# Patient Record
Sex: Female | Born: 1955 | Race: White | Hispanic: No | Marital: Married | Smoking: Never smoker
Health system: Southern US, Community
[De-identification: ages and names within clinical notes are randomized; demographics above are authoritative.]

## PROBLEM LIST (undated history)

## (undated) DIAGNOSIS — IMO0001 Reserved for inherently not codable concepts without codable children: Secondary | ICD-10-CM

## (undated) DIAGNOSIS — I10 Essential (primary) hypertension: Secondary | ICD-10-CM

## (undated) DIAGNOSIS — I471 Supraventricular tachycardia, unspecified: Secondary | ICD-10-CM

## (undated) DIAGNOSIS — F329 Major depressive disorder, single episode, unspecified: Secondary | ICD-10-CM

## (undated) DIAGNOSIS — K219 Gastro-esophageal reflux disease without esophagitis: Secondary | ICD-10-CM

## (undated) DIAGNOSIS — N281 Cyst of kidney, acquired: Secondary | ICD-10-CM

## (undated) DIAGNOSIS — F419 Anxiety disorder, unspecified: Secondary | ICD-10-CM

## (undated) DIAGNOSIS — R030 Elevated blood-pressure reading, without diagnosis of hypertension: Secondary | ICD-10-CM

## (undated) DIAGNOSIS — F32A Depression, unspecified: Secondary | ICD-10-CM

## (undated) DIAGNOSIS — N200 Calculus of kidney: Secondary | ICD-10-CM

## (undated) HISTORY — DX: Supraventricular tachycardia: I47.1

## (undated) HISTORY — DX: Major depressive disorder, single episode, unspecified: F32.9

## (undated) HISTORY — DX: Essential (primary) hypertension: I10

## (undated) HISTORY — DX: Cyst of kidney, acquired: N28.1

## (undated) HISTORY — DX: Reserved for inherently not codable concepts without codable children: IMO0001

## (undated) HISTORY — DX: Depression, unspecified: F32.A

## (undated) HISTORY — DX: Gastro-esophageal reflux disease without esophagitis: K21.9

## (undated) HISTORY — DX: Calculus of kidney: N20.0

## (undated) HISTORY — PX: OTHER SURGICAL HISTORY: SHX169

## (undated) HISTORY — DX: Anxiety disorder, unspecified: F41.9

## (undated) HISTORY — DX: Supraventricular tachycardia, unspecified: I47.10

## (undated) HISTORY — DX: Elevated blood-pressure reading, without diagnosis of hypertension: R03.0

---

## 2001-02-21 HISTORY — PX: TUBAL LIGATION: SHX77

## 2005-03-17 HISTORY — PX: COLONOSCOPY: SHX174

## 2005-05-13 ENCOUNTER — Ambulatory Visit: Payer: Self-pay | Admitting: General Surgery

## 2005-05-23 ENCOUNTER — Ambulatory Visit: Payer: Self-pay | Admitting: General Surgery

## 2005-06-19 ENCOUNTER — Ambulatory Visit: Payer: Self-pay | Admitting: General Surgery

## 2005-07-15 HISTORY — PX: CHOLECYSTECTOMY: SHX55

## 2005-07-30 ENCOUNTER — Ambulatory Visit: Payer: Self-pay | Admitting: General Surgery

## 2006-05-04 ENCOUNTER — Ambulatory Visit: Payer: Self-pay | Admitting: Specialist

## 2006-12-01 ENCOUNTER — Ambulatory Visit: Payer: Self-pay | Admitting: Specialist

## 2007-12-01 ENCOUNTER — Ambulatory Visit: Payer: Self-pay | Admitting: Specialist

## 2008-03-14 LAB — CBC AND DIFFERENTIAL: Hemoglobin: 12.8 g/dL (ref 12.0–16.0)

## 2008-03-14 LAB — LIPID PANEL
Cholesterol: 209 mg/dL — AB (ref 0–200)
Triglycerides: 130 mg/dL (ref 40–160)

## 2008-03-14 LAB — HEPATIC FUNCTION PANEL
ALT: 16 U/L (ref 7–35)
Alkaline Phosphatase: 109 U/L (ref 25–125)
Bilirubin, Total: 0.5 mg/dL

## 2008-03-14 LAB — BASIC METABOLIC PANEL
Creatinine: 1 mg/dL (ref 0.5–1.1)
Potassium: 4 mmol/L (ref 3.4–5.3)
Sodium: 147 mmol/L (ref 137–147)

## 2009-12-24 ENCOUNTER — Ambulatory Visit: Payer: Self-pay | Admitting: Specialist

## 2010-02-07 ENCOUNTER — Emergency Department: Payer: Self-pay | Admitting: Emergency Medicine

## 2010-02-13 ENCOUNTER — Ambulatory Visit: Payer: Self-pay | Admitting: Specialist

## 2010-02-15 LAB — PATHOLOGY REPORT

## 2010-03-20 ENCOUNTER — Encounter: Payer: Self-pay | Admitting: Specialist

## 2010-10-04 LAB — BASIC METABOLIC PANEL
Creatinine: 0.8 mg/dL (ref 0.5–1.1)
Glucose: 112 mg/dL
Potassium: 4.7 mmol/L (ref 3.4–5.3)
Sodium: 139 mmol/L (ref 137–147)

## 2010-10-04 LAB — HEPATIC FUNCTION PANEL: AST: 13 U/L (ref 13–35)

## 2010-10-04 LAB — LIPID PANEL
Cholesterol: 187 mg/dL (ref 0–200)
HDL: 55 mg/dL (ref 35–70)

## 2010-10-04 LAB — VITAMIN D 25 HYDROXY (VIT D DEFICIENCY, FRACTURES): Vit D, 25-Hydroxy: 25.2

## 2010-10-04 LAB — HEMOGLOBIN A1C: Hgb A1c MFr Bld: 6.1 % — AB (ref 4.0–6.0)

## 2010-10-04 LAB — TSH: TSH: 1.42 u[IU]/mL (ref 0.41–5.90)

## 2010-11-16 ENCOUNTER — Encounter: Payer: Self-pay | Admitting: Specialist

## 2011-04-14 ENCOUNTER — Emergency Department: Payer: Self-pay | Admitting: Emergency Medicine

## 2011-04-14 LAB — URINALYSIS, COMPLETE
Bilirubin,UR: NEGATIVE
Blood: NEGATIVE
Glucose,UR: NEGATIVE mg/dL (ref 0–75)
Leukocyte Esterase: NEGATIVE
Nitrite: NEGATIVE
Ph: 6 (ref 4.5–8.0)
Protein: NEGATIVE
Squamous Epithelial: 1
WBC UR: 1 /HPF (ref 0–5)

## 2011-04-21 ENCOUNTER — Encounter: Payer: Self-pay | Admitting: Internal Medicine

## 2011-04-21 ENCOUNTER — Ambulatory Visit (INDEPENDENT_AMBULATORY_CARE_PROVIDER_SITE_OTHER): Payer: PRIVATE HEALTH INSURANCE | Admitting: Internal Medicine

## 2011-04-21 DIAGNOSIS — E669 Obesity, unspecified: Secondary | ICD-10-CM | POA: Insufficient documentation

## 2011-04-21 DIAGNOSIS — Z0000 Encounter for general adult medical examination without abnormal findings: Secondary | ICD-10-CM

## 2011-04-21 DIAGNOSIS — F39 Unspecified mood [affective] disorder: Secondary | ICD-10-CM | POA: Insufficient documentation

## 2011-04-21 DIAGNOSIS — Z1239 Encounter for other screening for malignant neoplasm of breast: Secondary | ICD-10-CM

## 2011-04-21 DIAGNOSIS — F3289 Other specified depressive episodes: Secondary | ICD-10-CM

## 2011-04-21 DIAGNOSIS — G5710 Meralgia paresthetica, unspecified lower limb: Secondary | ICD-10-CM

## 2011-04-21 DIAGNOSIS — F329 Major depressive disorder, single episode, unspecified: Secondary | ICD-10-CM

## 2011-04-21 DIAGNOSIS — E119 Type 2 diabetes mellitus without complications: Secondary | ICD-10-CM | POA: Insufficient documentation

## 2011-04-21 DIAGNOSIS — E1169 Type 2 diabetes mellitus with other specified complication: Secondary | ICD-10-CM | POA: Insufficient documentation

## 2011-04-21 DIAGNOSIS — F32A Depression, unspecified: Secondary | ICD-10-CM

## 2011-04-21 LAB — LIPID PANEL
Cholesterol: 187 mg/dL (ref 0–200)
LDL Cholesterol: 108 mg/dL — ABNORMAL HIGH (ref 0–99)
Total CHOL/HDL Ratio: 4
Triglycerides: 168 mg/dL — ABNORMAL HIGH (ref 0.0–149.0)

## 2011-04-21 LAB — CBC WITH DIFFERENTIAL/PLATELET
Eosinophils Relative: 2.4 % (ref 0.0–5.0)
HCT: 38.8 % (ref 36.0–46.0)
Hemoglobin: 13.1 g/dL (ref 12.0–15.0)
Lymphs Abs: 2.1 10*3/uL (ref 0.7–4.0)
Monocytes Relative: 7.6 % (ref 3.0–12.0)
Neutro Abs: 4.3 10*3/uL (ref 1.4–7.7)
WBC: 7.1 10*3/uL (ref 4.5–10.5)

## 2011-04-21 LAB — COMPREHENSIVE METABOLIC PANEL
Albumin: 4 g/dL (ref 3.5–5.2)
Alkaline Phosphatase: 87 U/L (ref 39–117)
BUN: 13 mg/dL (ref 6–23)
CO2: 27 mEq/L (ref 19–32)
Calcium: 8.9 mg/dL (ref 8.4–10.5)
Chloride: 104 mEq/L (ref 96–112)
Glucose, Bld: 109 mg/dL — ABNORMAL HIGH (ref 70–99)
Potassium: 3.9 mEq/L (ref 3.5–5.1)
Sodium: 138 mEq/L (ref 135–145)
Total Protein: 7.5 g/dL (ref 6.0–8.3)

## 2011-04-21 LAB — TSH: TSH: 1.53 u[IU]/mL (ref 0.35–5.50)

## 2011-04-21 MED ORDER — CITALOPRAM HYDROBROMIDE 20 MG PO TABS: 20.0000 mg | ORAL_TABLET | Freq: Every day | ORAL | Status: DC

## 2011-04-21 NOTE — Progress Notes (Signed)
Subjective:    Patient ID: Shelby Estes, female    DOB: 02/20/1956, 56 y.o.   MRN: [id number]  HPI 56 year old female with history of diabetes, depression, and recent history of right femoral cutaneous nerve syndrome presents to establish care. In regards to her diabetes, she notes that she hasn't been regular the checking her blood sugar. She has not been taking her medication. She has not had her A1c checked in several months. She denies polyuria or polyphasia. She denies weight loss.  In regards to her depression, she notes some recent exacerbation with marital stressors involving her husband and stepchildren. She is tearful today when describing the situation. She reports some improvement with the use of Celexa and does not wish change in medication dose. She is not currently interested in counseling and she does not feel that her husband will participate in this.  In regards to her weight, she notes that she has recently gained almost 30 pounds over the last year. She denies any change in her intake of food. She does not follow a specific diet. She does not currently exercise.  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 She occasionally has numbness extending down her anterior right thigh to her knee. She has not had any weakness in her right leg. She was also instructed to make effort to lose weight to help control symptoms.  Outpatient Encounter Prescriptions as of 04/21/2011  Medication Sig Dispense Refill  . Cholecalciferol (VITAMIN D) 2000 UNITS CAPS Take 1 capsule by mouth daily.      . citalopram (CELEXA) 20 MG tablet Take 1 tablet (20 mg total) by mouth daily.  30 tablet  6  . gabapentin (NEURONTIN) 400 MG capsule Take 400 mg by mouth 2 (two) times daily as needed.      Marland Kitchen HYDROcodone-acetaminophen (NORCO) 5-325 MG per tablet Take 1 tablet by mouth  every 8 (eight) hours as needed.      . meloxicam (MOBIC) 15 MG tablet Take 15 mg by mouth daily.      Marland Kitchen DISCONTD: citalopram (CELEXA) 20 MG tablet Take 20 mg by mouth daily.        Review of Systems  Constitutional: Negative for fever, chills, appetite change, fatigue and unexpected weight change.  HENT: Negative for ear pain, congestion, sore throat, trouble swallowing, neck pain, voice change and sinus pressure.   Eyes: Negative for visual disturbance.  Respiratory: Negative for cough, shortness of breath, wheezing and stridor.   Cardiovascular: Negative for chest pain, palpitations and leg swelling.  Gastrointestinal: Negative for nausea, vomiting, abdominal pain, diarrhea, constipation, blood in stool, abdominal distention and anal bleeding.  Genitourinary: Negative for dysuria and flank pain.  Musculoskeletal: Positive for myalgias. Negative for arthralgias and gait problem.  Skin: Negative for color change and rash.  Neurological: Positive for numbness. Negative for dizziness and headaches.  Hematological: Negative for adenopathy. Does not bruise/bleed easily.  Psychiatric/Behavioral: Positive for dysphoric mood. Negative for suicidal ideas and sleep disturbance. The patient is not nervous/anxious.    BP 130/80  Pulse 116  Temp(Src) 98.1 F (36.7 C) (Oral)  Ht 5' 3.5" (1.613 m)  Wt 282 lb (127.914 kg)  BMI 49.17 kg/m2  SpO2 96%     Objective:   Physical Exam  Constitutional: She is oriented to person, place, and time. She appears well-developed and well-nourished. No distress.  HENT:  Head: Normocephalic and  atraumatic.  Right Ear: External ear normal.  Left Ear: External ear normal.  Nose: Nose normal.  Mouth/Throat: Oropharynx is clear and moist. No oropharyngeal exudate.  Eyes: Conjunctivae are normal. Pupils are equal, round, and reactive to light. Right eye exhibits no discharge. Left eye exhibits no discharge. No scleral icterus.  Neck: Normal range of motion. Neck  supple. No tracheal deviation present. No thyromegaly present.  Cardiovascular: Normal rate, regular rhythm, normal heart sounds and intact distal pulses.  Exam reveals no gallop and no friction rub.   No murmur heard. Pulmonary/Chest: Effort normal and breath sounds normal. No respiratory distress. She has no wheezes. She has no rales. She exhibits no tenderness.  Abdominal: Soft. Bowel sounds are normal. She exhibits no distension and no mass. There is no tenderness. There is no rebound and no guarding.  Musculoskeletal: Normal range of motion. She exhibits no edema and no tenderness.  Lymphadenopathy:    She has no cervical adenopathy.  Neurological: She is alert and oriented to person, place, and time. No cranial nerve deficit. She exhibits normal muscle tone. Coordination normal.  Skin: Skin is warm and dry. No rash noted. She is not diaphoretic. No erythema. No pallor.  Psychiatric: She has a normal mood and affect. Her behavior is normal. Judgment and thought content normal.          Assessment & Plan:

## 2011-04-21 NOTE — Assessment & Plan Note (Signed)
BMI 49. Patient is interested in losing weight. We discussed keeping a food diary and setting goal of 1500 calories per day. We also discussed using medicine to suppress appetite such as phentermine. For now, we'll hold off on medication, will check labs including thyroid function to make sure no other underlying causes for weight gain. Followup in one month.

## 2011-04-21 NOTE — Assessment & Plan Note (Signed)
>>  ASSESSMENT AND PLAN FOR ANXIETY AND DEPRESSION WRITTEN ON 04/21/2011 12:45 PM BY VANNIE, JENNIFER A, MD  Recently worsened with marital stressors. Offered support today. Patient is currently taking Celexa . She feels that her dose is adequate and does not want to change dose of this medication. We discussed the option of counseling however she does not feel that her husband would protect paganism does not feel that it would be helpful. She will followup in one month.

## 2011-04-21 NOTE — Assessment & Plan Note (Addendum)
Pt has not been checking blood sugars. Will check A1c with labs today. Followup in one month.

## 2011-04-21 NOTE — Assessment & Plan Note (Signed)
Recently worsened with marital stressors. Offered support today. Patient is currently taking Celexa. She feels that her dose is adequate and does not want to change dose of this medication. We discussed the option of counseling however she does not feel that her husband would protect paganism does not feel that it would be helpful. She will followup in one month.

## 2011-04-21 NOTE — Assessment & Plan Note (Signed)
Symptoms currently well-controlled use of Neurontin and meloxicam. We'll continue to monitor.

## 2011-04-29 ENCOUNTER — Encounter: Payer: Self-pay | Admitting: Internal Medicine

## 2011-05-01 ENCOUNTER — Telehealth: Payer: Self-pay | Admitting: *Deleted

## 2011-05-01 MED ORDER — PHENTERMINE HCL 37.5 MG PO CAPS: 37.5000 mg | ORAL_CAPSULE | ORAL | Status: DC

## 2011-05-01 NOTE — Telephone Encounter (Signed)
Called in.

## 2011-05-01 NOTE — Telephone Encounter (Signed)
Fine to call in phentermine.

## 2011-05-23 ENCOUNTER — Encounter: Payer: Self-pay | Admitting: Internal Medicine

## 2011-05-23 ENCOUNTER — Ambulatory Visit (INDEPENDENT_AMBULATORY_CARE_PROVIDER_SITE_OTHER): Payer: PRIVATE HEALTH INSURANCE | Admitting: Internal Medicine

## 2011-05-23 VITALS — BP 122/80 | HR 125 | Temp 98.1°F | Ht 63.5 in | Wt 272.0 lb

## 2011-05-23 DIAGNOSIS — E1169 Type 2 diabetes mellitus with other specified complication: Secondary | ICD-10-CM

## 2011-05-23 DIAGNOSIS — G5710 Meralgia paresthetica, unspecified lower limb: Secondary | ICD-10-CM

## 2011-05-23 DIAGNOSIS — E119 Type 2 diabetes mellitus without complications: Secondary | ICD-10-CM

## 2011-05-23 DIAGNOSIS — E66813 Obesity, class 3: Secondary | ICD-10-CM

## 2011-05-23 DIAGNOSIS — Z124 Encounter for screening for malignant neoplasm of cervix: Secondary | ICD-10-CM

## 2011-05-23 DIAGNOSIS — Z1239 Encounter for other screening for malignant neoplasm of breast: Secondary | ICD-10-CM

## 2011-05-23 DIAGNOSIS — E669 Obesity, unspecified: Secondary | ICD-10-CM

## 2011-05-23 NOTE — Assessment & Plan Note (Signed)
Pelvic exam normal today. Pap is pending.

## 2011-05-23 NOTE — Assessment & Plan Note (Signed)
BMI 47. Congratulated patient on 10 pound weight loss since her last visit. She has only started using phentermine for appetite suppression within the last week. She denies any side effects noted from this medicine. We'll continue to use. She will followup monthly well using phentermine. Encouraged her to continue to limit caloric intake to less than 1500 calories per day and to gradually increase physical activity.

## 2011-05-23 NOTE — Progress Notes (Signed)
Subjective:    Patient ID: Shelby Estes, female    DOB: 29-Jun-1955, 56 y.o.   MRN: [id number]  HPI 56 year old female with obesity, diabetes presents for annual exam. She reports that she is doing quite well. She has been limiting caloric intake and started taking phentermine for appetite suppression one week ago. She has lost 10 pounds since her last visit. She denies any side effects noted from phentermine. She has not yet started an exercise program.  In regards to her paresthesias of her right thigh, she reports significant improvement with 10 pound weight loss. She is not currently experiencing any symptoms or taking any medication for this.  In regards to diabetes, last hemoglobin A1c checked one month ago was 6.4%. She has noted that sugars have gradually decreased with weight loss. Most blood sugars fasting near 120.  She notes that she is due for Pap smear she has not had one in several years. She denies any pelvic pain, vaginal discharge, or other concerns. She is also due for a mammogram. She has not noted any lesions in her breast, change and overlying skin, or other concerns.  Outpatient Encounter Prescriptions as of 05/23/2011  Medication Sig Dispense Refill  . Cholecalciferol (VITAMIN D) 2000 UNITS CAPS Take 1 capsule by mouth daily.      . citalopram (CELEXA) 20 MG tablet Take 1 tablet (20 mg total) by mouth daily.  30 tablet  6  . gabapentin (NEURONTIN) 400 MG capsule Take 400 mg by mouth 2 (two) times daily as needed.      Marland Kitchen HYDROcodone-acetaminophen (NORCO) 5-325 MG per tablet Take 1 tablet by mouth every 8 (eight) hours as needed.      . meloxicam (MOBIC) 15 MG tablet Take 15 mg by mouth daily.      . phentermine 37.5 MG capsule Take 1 capsule (37.5 mg total) by mouth every morning.  30 capsule  1    Review of Systems  Constitutional: Negative for fever, chills, appetite change, fatigue and unexpected weight change.  HENT: Negative for ear pain, congestion, sore throat,  trouble swallowing, neck pain, voice change and sinus pressure.   Eyes: Negative for visual disturbance.  Respiratory: Negative for cough, shortness of breath, wheezing and stridor.   Cardiovascular: Negative for chest pain, palpitations and leg swelling.  Gastrointestinal: Negative for nausea, vomiting, abdominal pain, diarrhea, constipation, blood in stool, abdominal distention and anal bleeding.  Genitourinary: Negative for dysuria and flank pain.  Musculoskeletal: Negative for myalgias, arthralgias and gait problem.  Skin: Negative for color change and rash.  Neurological: Negative for dizziness and headaches.  Hematological: Negative for adenopathy. Does not bruise/bleed easily.  Psychiatric/Behavioral: Negative for suicidal ideas, sleep disturbance and dysphoric mood. The patient is not nervous/anxious.        Objective:   Physical Exam  Constitutional: She is oriented to person, place, and time. She appears well-developed and well-nourished. No distress.  HENT:  Head: Normocephalic and atraumatic.  Right Ear: External ear normal.  Left Ear: External ear normal.  Nose: Nose normal.  Mouth/Throat: Oropharynx is clear and moist. No oropharyngeal exudate.  Eyes: Conjunctivae are normal. Pupils are equal, round, and reactive to light. Right eye exhibits no discharge. Left eye exhibits no discharge. No scleral icterus.  Neck: Normal range of motion. Neck supple. No tracheal deviation present. No thyromegaly present.  Cardiovascular: Normal rate, regular rhythm, normal heart sounds and intact distal pulses.  Exam reveals no gallop and no friction rub.   No murmur heard.  Pulmonary/Chest: Effort normal and breath sounds normal. No respiratory distress. She has no wheezes. She has no rales. She exhibits no tenderness.  Abdominal: Soft. Bowel sounds are normal. She exhibits no distension and no mass. There is no tenderness. There is no rebound and no guarding.  Genitourinary: Uterus normal.  No breast swelling, tenderness, discharge or bleeding. Pelvic exam was performed with patient prone. There is no rash, tenderness or lesion on the right labia. There is no rash, tenderness or lesion on the left labia. Uterus is not enlarged and not tender. Cervix exhibits no motion tenderness, no discharge and no friability. Right adnexum displays no mass, no tenderness and no fullness. Left adnexum displays no mass, no tenderness and no fullness. There is erythema around the vagina. No tenderness around the vagina. No vaginal discharge found.  Musculoskeletal: Normal range of motion. She exhibits no edema and no tenderness.  Lymphadenopathy:    She has no cervical adenopathy.  Neurological: She is alert and oriented to person, place, and time. No cranial nerve deficit. She exhibits normal muscle tone. Coordination normal.  Skin: Skin is warm and dry. No rash noted. She is not diaphoretic. No erythema. No pallor.  Psychiatric: She has a normal mood and affect. Her behavior is normal. Judgment and thought content normal.          Assessment & Plan:

## 2011-05-23 NOTE — Assessment & Plan Note (Signed)
Patient reports improvement with weight loss. Will continue to monitor.

## 2011-05-23 NOTE — Assessment & Plan Note (Signed)
Breast exam normal today. Mammogram ordered. 

## 2011-05-26 ENCOUNTER — Other Ambulatory Visit (HOSPITAL_COMMUNITY)
Admission: RE | Admit: 2011-05-26 | Discharge: 2011-05-26 | Disposition: A | Discharge status: Home / Self Care | Payer: PRIVATE HEALTH INSURANCE | Source: Ambulatory Visit | Attending: Internal Medicine | Admitting: Internal Medicine

## 2011-05-26 DIAGNOSIS — Z1159 Encounter for screening for other viral diseases: Secondary | ICD-10-CM | POA: Insufficient documentation

## 2011-05-26 DIAGNOSIS — Z01419 Encounter for gynecological examination (general) (routine) without abnormal findings: Secondary | ICD-10-CM | POA: Insufficient documentation

## 2011-05-26 NOTE — Progress Notes (Signed)
Addended by: Jobie Quaker on: 05/26/2011 02:08 PM   Modules accepted: Orders

## 2011-05-30 ENCOUNTER — Other Ambulatory Visit: Payer: Self-pay | Admitting: *Deleted

## 2011-05-30 MED ORDER — FLUCONAZOLE 150 MG PO TABS: 150.0000 mg | ORAL_TABLET | Freq: Once | ORAL | Status: AC

## 2011-06-02 ENCOUNTER — Encounter: Payer: Self-pay | Admitting: Internal Medicine

## 2011-06-23 ENCOUNTER — Encounter: Payer: Self-pay | Admitting: Internal Medicine

## 2011-06-23 ENCOUNTER — Ambulatory Visit (INDEPENDENT_AMBULATORY_CARE_PROVIDER_SITE_OTHER): Payer: PRIVATE HEALTH INSURANCE | Admitting: Internal Medicine

## 2011-06-23 VITALS — BP 127/86 | HR 100 | Temp 98.1°F | Ht 63.5 in | Wt 264.5 lb

## 2011-06-23 DIAGNOSIS — E663 Overweight: Secondary | ICD-10-CM

## 2011-06-23 LAB — HM MAMMOGRAPHY

## 2011-06-23 MED ORDER — PHENTERMINE HCL 37.5 MG PO CAPS: 37.5000 mg | ORAL_CAPSULE | ORAL | Status: DC

## 2011-06-23 NOTE — Assessment & Plan Note (Signed)
BMI 46. Congratulated patient on 8 pound weight loss. Encouraged her to continue efforts at healthy diet and exercise. Will continue phentermine for appetite suppression. Followup in one month.

## 2011-06-23 NOTE — Progress Notes (Signed)
Subjective:    Patient ID: Shelby Estes, female    DOB: September 06, 1955, 56 y.o.   MRN: [id number]  HPI 57 year old female with history of obesity, diabetes presents for followup. She reports she has been doing very well. She has been following a healthy diet and is starting to increase her physical activity by walking. She continues on phentermine for appetite suppression. She denies any side effects from this medication. She has lost nearly 20 pounds over the last 3 months.  Outpatient Encounter Prescriptions as of 06/23/2011  Medication Sig Dispense Refill  . Cholecalciferol (VITAMIN D) 2000 UNITS CAPS Take 1 capsule by mouth daily.      . citalopram (CELEXA) 20 MG tablet Take 1 tablet (20 mg total) by mouth daily.  30 tablet  6  . meloxicam (MOBIC) 15 MG tablet Take 15 mg by mouth daily.      Marland Kitchen gabapentin (NEURONTIN) 400 MG capsule Take 400 mg by mouth 2 (two) times daily as needed.      Marland Kitchen HYDROcodone-acetaminophen (NORCO) 5-325 MG per tablet Take 1 tablet by mouth every 8 (eight) hours as needed.      . phentermine 37.5 MG capsule Take 1 capsule (37.5 mg total) by mouth every morning.  30 capsule  1  . DISCONTD: phentermine 37.5 MG capsule Take 1 capsule (37.5 mg total) by mouth every morning.  30 capsule  1    Review of Systems  Constitutional: Negative for fever, chills, appetite change, fatigue and unexpected weight change.  HENT: Negative for ear pain, congestion, sore throat, trouble swallowing, neck pain, voice change and sinus pressure.   Eyes: Negative for visual disturbance.  Respiratory: Negative for cough, shortness of breath, wheezing and stridor.   Cardiovascular: Negative for chest pain, palpitations and leg swelling.  Gastrointestinal: Negative for nausea, vomiting, abdominal pain, diarrhea, constipation, blood in stool, abdominal distention and anal bleeding.  Genitourinary: Negative for dysuria and flank pain.  Musculoskeletal: Negative for myalgias, arthralgias and gait  problem.  Skin: Negative for color change and rash.  Neurological: Negative for dizziness and headaches.  Hematological: Negative for adenopathy. Does not bruise/bleed easily.  Psychiatric/Behavioral: Negative for suicidal ideas, sleep disturbance and dysphoric mood. The patient is not nervous/anxious.    BP 127/86  Pulse 100  Temp(Src) 98.1 F (36.7 C) (Oral)  Ht 5' 3.5" (1.613 m)  Wt 264 lb 8 oz (119.976 kg)  BMI 46.12 kg/m2  SpO2 96%     Objective:   Physical Exam  Constitutional: She is oriented to person, place, and time. She appears well-developed and well-nourished. No distress.  HENT:  Head: Normocephalic and atraumatic.  Right Ear: External ear normal.  Left Ear: External ear normal.  Nose: Nose normal.  Mouth/Throat: Oropharynx is clear and moist. No oropharyngeal exudate.  Eyes: Conjunctivae are normal. Pupils are equal, round, and reactive to light. Right eye exhibits no discharge. Left eye exhibits no discharge. No scleral icterus.  Neck: Normal range of motion. Neck supple. No tracheal deviation present. No thyromegaly present.  Cardiovascular: Normal rate, regular rhythm, normal heart sounds and intact distal pulses.  Exam reveals no gallop and no friction rub.   No murmur heard. Pulmonary/Chest: Effort normal and breath sounds normal. No respiratory distress. She has no wheezes. She has no rales. She exhibits no tenderness.  Musculoskeletal: Normal range of motion. She exhibits no edema and no tenderness.  Lymphadenopathy:    She has no cervical adenopathy.  Neurological: She is alert and oriented to person, place, and  time. No cranial nerve deficit. She exhibits normal muscle tone. Coordination normal.  Skin: Skin is warm and dry. No rash noted. She is not diaphoretic. No erythema. No pallor.  Psychiatric: She has a normal mood and affect. Her behavior is normal. Judgment and thought content normal.          Assessment & Plan:

## 2011-06-27 ENCOUNTER — Encounter: Payer: Self-pay | Admitting: Internal Medicine

## 2011-07-23 ENCOUNTER — Ambulatory Visit (INDEPENDENT_AMBULATORY_CARE_PROVIDER_SITE_OTHER): Payer: PRIVATE HEALTH INSURANCE | Admitting: Internal Medicine

## 2011-07-23 ENCOUNTER — Encounter: Payer: Self-pay | Admitting: Internal Medicine

## 2011-07-23 VITALS — BP 122/86 | HR 78 | Temp 98.6°F | Wt 262.5 lb

## 2011-07-23 DIAGNOSIS — E669 Obesity, unspecified: Secondary | ICD-10-CM

## 2011-07-23 DIAGNOSIS — E1169 Type 2 diabetes mellitus with other specified complication: Secondary | ICD-10-CM

## 2011-07-23 DIAGNOSIS — E663 Overweight: Secondary | ICD-10-CM

## 2011-07-23 DIAGNOSIS — Z23 Encounter for immunization: Secondary | ICD-10-CM

## 2011-07-23 MED ORDER — ZOSTER VACCINE LIVE 19400 UNT/0.65ML ~~LOC~~ SOLR: 0.6500 mL | Freq: Once | SUBCUTANEOUS | Status: AC

## 2011-07-23 NOTE — Assessment & Plan Note (Signed)
Blood sugars have been well-controlled per patient report. Will plan to repeat hemoglobin A1c in August 2013.

## 2011-07-23 NOTE — Progress Notes (Signed)
Subjective:    Patient ID: Shelby Estes, female    DOB: 27-May-1955, 56 y.o.   MRN: [id number]  HPI 56 year old female with history of obesity presents for followup. She reports she continues to make efforts at healthy diet and increase physical activity. She has been taking phentermine during the week with improvement in her appetite. She denies any side effects noted from this medication. Next  Her primary concern today is her need for her shingles vaccine. She notes that her husband was recently diagnosed with shingles and has had significant pain from this. She questions whether she needs to have the shingles vaccine. She is also concerned about the potential indications this has for her newborn grandchild.  She also notes that she has been checking her blood sugars, and they typically run between 110 and 120 in the morning and closer to 90 in the evenings.  Outpatient Encounter Prescriptions as of 07/23/2011  Medication Sig Dispense Refill  . Cholecalciferol (VITAMIN D) 2000 UNITS CAPS Take 1 capsule by mouth daily.      . citalopram (CELEXA) 20 MG tablet Take 1 tablet (20 mg total) by mouth daily.  30 tablet  6  . phentermine 37.5 MG capsule Take 1 capsule (37.5 mg total) by mouth every morning.  30 capsule  1  . zoster vaccine live, PF, (ZOSTAVAX) 40981 UNT/0.65ML injection Inject 19,400 Units into the skin once.  1 each  0    Review of Systems  Constitutional: Negative for fever, chills, appetite change, fatigue and unexpected weight change.  HENT: Negative for ear pain, congestion, sore throat, trouble swallowing, neck pain, voice change and sinus pressure.   Eyes: Negative for visual disturbance.  Respiratory: Negative for cough, shortness of breath, wheezing and stridor.   Cardiovascular: Negative for chest pain, palpitations and leg swelling.  Gastrointestinal: Negative for nausea, vomiting, abdominal pain, diarrhea, constipation, blood in stool, abdominal distention and anal  bleeding.  Genitourinary: Negative for dysuria and flank pain.  Musculoskeletal: Negative for myalgias, arthralgias and gait problem.  Skin: Negative for color change and rash.  Neurological: Negative for dizziness and headaches.  Hematological: Negative for adenopathy. Does not bruise/bleed easily.  Psychiatric/Behavioral: Negative for suicidal ideas, sleep disturbance and dysphoric mood. The patient is not nervous/anxious.    BP 122/86  Pulse 78  Temp(Src) 98.6 F (37 C) (Oral)  Wt 262 lb 8 oz (119.069 kg)  SpO2 98%     Objective:   Physical Exam  Constitutional: She is oriented to person, place, and time. She appears well-developed and well-nourished. No distress.  HENT:  Head: Normocephalic and atraumatic.  Right Ear: External ear normal.  Left Ear: External ear normal.  Nose: Nose normal.  Mouth/Throat: Oropharynx is clear and moist. No oropharyngeal exudate.  Eyes: Conjunctivae are normal. Pupils are equal, round, and reactive to light. Right eye exhibits no discharge. Left eye exhibits no discharge. No scleral icterus.  Neck: Normal range of motion. Neck supple. No tracheal deviation present. No thyromegaly present.  Cardiovascular: Normal rate, regular rhythm, normal heart sounds and intact distal pulses.  Exam reveals no gallop and no friction rub.   No murmur heard. Pulmonary/Chest: Effort normal and breath sounds normal. No respiratory distress. She has no wheezes. She has no rales. She exhibits no tenderness.  Musculoskeletal: Normal range of motion. She exhibits no edema and no tenderness.  Lymphadenopathy:    She has no cervical adenopathy.  Neurological: She is alert and oriented to person, place, and time. No cranial nerve  deficit. She exhibits normal muscle tone. Coordination normal.  Skin: Skin is warm and dry. No rash noted. She is not diaphoretic. No erythema. No pallor.  Psychiatric: She has a normal mood and affect. Her behavior is normal. Judgment and thought  content normal.          Assessment & Plan:

## 2011-07-23 NOTE — Assessment & Plan Note (Signed)
BMI 45. Congratulated patient on another 2 pound weight loss. Encouraged her to continue efforts at healthy diet and exercise. Will continue phentermine for appetite suppression. Followup in one month.

## 2011-08-26 ENCOUNTER — Encounter: Payer: Self-pay | Admitting: Internal Medicine

## 2011-09-03 ENCOUNTER — Encounter: Payer: Self-pay | Admitting: Internal Medicine

## 2011-09-03 ENCOUNTER — Ambulatory Visit (INDEPENDENT_AMBULATORY_CARE_PROVIDER_SITE_OTHER): Payer: PRIVATE HEALTH INSURANCE | Admitting: Internal Medicine

## 2011-09-03 VITALS — BP 128/86 | HR 94 | Temp 98.7°F | Ht 63.5 in | Wt 260.8 lb

## 2011-09-03 DIAGNOSIS — IMO0001 Reserved for inherently not codable concepts without codable children: Secondary | ICD-10-CM | POA: Insufficient documentation

## 2011-09-03 DIAGNOSIS — R030 Elevated blood-pressure reading, without diagnosis of hypertension: Secondary | ICD-10-CM

## 2011-09-03 DIAGNOSIS — E1169 Type 2 diabetes mellitus with other specified complication: Secondary | ICD-10-CM

## 2011-09-03 DIAGNOSIS — E119 Type 2 diabetes mellitus without complications: Secondary | ICD-10-CM

## 2011-09-03 DIAGNOSIS — E669 Obesity, unspecified: Secondary | ICD-10-CM

## 2011-09-03 NOTE — Assessment & Plan Note (Signed)
BP initially elevated, but improved after pt seated for .  Will continue to monitor. Consider stopping phentermine and adding ACEi if BP elevated next visit 1 month.

## 2011-09-03 NOTE — Assessment & Plan Note (Signed)
Congratulated pt on weight loss of 2lbs. Encouraged her to start exercising. Encouraged her to continue efforts at healthy diet. Continue phentermine for appetite suppression. Follow up 1 month.

## 2011-09-03 NOTE — Assessment & Plan Note (Signed)
Pt reports good control of blood sugars. Will plan to check A1c q60months for now, as borderline. Next check 10/2011. Encouraged efforts at healthy diet and exercise.

## 2011-09-03 NOTE — Progress Notes (Signed)
  Subjective:    Patient ID: Shelby Estes, female    DOB: 02/27/1956, 56 y.o.   MRN: [id number]  HPI 56YO female with h/o diabetes, obesity presents for follow up. Doing well. Continues to use phentermine during the week for appetite suppression. Following relatively healthy diet. Not yet exercising. Notes recent sinus congestion, headache x 2 days. Using Tylenol sinus with some improvement. No fever, chills, dyspnea.  In regards to DM, BG running near 120 fasting in AM and near 80 in the evening. No BG>150 or <60.  Outpatient Encounter Prescriptions as of 09/03/2011  Medication Sig Dispense Refill  . Cholecalciferol (VITAMIN D) 2000 UNITS CAPS Take 1 capsule by mouth daily.      . citalopram (CELEXA) 20 MG tablet Take 1 tablet (20 mg total) by mouth daily.  30 tablet  6  . phentermine 37.5 MG capsule Take 1 capsule (37.5 mg total) by mouth every morning.  30 capsule  1    Review of Systems  Constitutional: Negative for fever, chills, appetite change, fatigue and unexpected weight change.  HENT: Negative for ear pain, congestion, sore throat, trouble swallowing, neck pain, voice change and sinus pressure.   Eyes: Negative for visual disturbance.  Respiratory: Negative for cough, shortness of breath, wheezing and stridor.   Cardiovascular: Negative for chest pain, palpitations and leg swelling.  Gastrointestinal: Negative for nausea, vomiting, abdominal pain, diarrhea, constipation, blood in stool, abdominal distention and anal bleeding.  Genitourinary: Negative for dysuria and flank pain.  Musculoskeletal: Negative for myalgias, arthralgias and gait problem.  Skin: Negative for color change and rash.  Neurological: Negative for dizziness and headaches.  Hematological: Negative for adenopathy. Does not bruise/bleed easily.  Psychiatric/Behavioral: Negative for suicidal ideas, disturbed wake/sleep cycle and dysphoric mood. The patient is not nervous/anxious.    BP 128/86  Pulse 94  Temp  98.7 F (37.1 C) (Oral)  Ht 5' 3.5" (1.613 m)  Wt 260 lb 12 oz (118.275 kg)  BMI 45.47 kg/m2  SpO2 98%     Objective:   Physical Exam  Constitutional: She is oriented to person, place, and time. She appears well-developed and well-nourished. No distress.  HENT:  Head: Normocephalic and atraumatic.  Right Ear: External ear normal.  Left Ear: External ear normal.  Nose: Nose normal.  Mouth/Throat: Oropharynx is clear and moist. No oropharyngeal exudate.  Eyes: Conjunctivae are normal. Pupils are equal, round, and reactive to light. Right eye exhibits no discharge. Left eye exhibits no discharge. No scleral icterus.  Neck: Normal range of motion. Neck supple. No tracheal deviation present. No thyromegaly present.  Cardiovascular: Normal rate, regular rhythm, normal heart sounds and intact distal pulses.  Exam reveals no gallop and no friction rub.   No murmur heard. Pulmonary/Chest: Effort normal and breath sounds normal. No respiratory distress. She has no wheezes. She has no rales. She exhibits no tenderness.  Musculoskeletal: Normal range of motion. She exhibits no edema and no tenderness.  Lymphadenopathy:    She has no cervical adenopathy.  Neurological: She is alert and oriented to person, place, and time. No cranial nerve deficit. She exhibits normal muscle tone. Coordination normal.  Skin: Skin is warm and dry. No rash noted. She is not diaphoretic. No erythema. No pallor.  Psychiatric: She has a normal mood and affect. Her behavior is normal. Judgment and thought content normal.          Assessment & Plan:

## 2011-10-13 ENCOUNTER — Ambulatory Visit: Payer: PRIVATE HEALTH INSURANCE | Admitting: Internal Medicine

## 2011-10-17 ENCOUNTER — Other Ambulatory Visit: Payer: Self-pay | Admitting: *Deleted

## 2011-10-17 DIAGNOSIS — E663 Overweight: Secondary | ICD-10-CM

## 2011-10-18 MED ORDER — PHENTERMINE HCL 37.5 MG PO CAPS: 37.5000 mg | ORAL_CAPSULE | ORAL | Status: DC

## 2011-10-20 NOTE — Telephone Encounter (Signed)
Rx called to [facility].

## 2011-11-20 ENCOUNTER — Ambulatory Visit (INDEPENDENT_AMBULATORY_CARE_PROVIDER_SITE_OTHER): Payer: PRIVATE HEALTH INSURANCE | Admitting: Internal Medicine

## 2011-11-20 ENCOUNTER — Encounter: Payer: Self-pay | Admitting: Internal Medicine

## 2011-11-20 VITALS — BP 138/82 | HR 97 | Temp 98.3°F | Ht 63.5 in | Wt 256.5 lb

## 2011-11-20 DIAGNOSIS — E663 Overweight: Secondary | ICD-10-CM

## 2011-11-20 DIAGNOSIS — E669 Obesity, unspecified: Secondary | ICD-10-CM

## 2011-11-20 DIAGNOSIS — E1169 Type 2 diabetes mellitus with other specified complication: Secondary | ICD-10-CM

## 2011-11-20 DIAGNOSIS — E119 Type 2 diabetes mellitus without complications: Secondary | ICD-10-CM

## 2011-11-20 LAB — COMPREHENSIVE METABOLIC PANEL
Albumin: 4 g/dL (ref 3.5–5.2)
Alkaline Phosphatase: 90 U/L (ref 39–117)
BUN: 13 mg/dL (ref 6–23)
CO2: 27 mEq/L (ref 19–32)
Calcium: 9.2 mg/dL (ref 8.4–10.5)
Chloride: 103 mEq/L (ref 96–112)
GFR: 73.61 mL/min (ref >60.00)
Glucose, Bld: 101 mg/dL — ABNORMAL HIGH (ref 70–99)
Potassium: 3.6 mEq/L (ref 3.5–5.1)
Sodium: 139 mEq/L (ref 135–145)
Total Protein: 7.4 g/dL (ref 6.0–8.3)

## 2011-11-20 LAB — LIPID PANEL
Cholesterol: 170 mg/dL (ref 0–200)
LDL Cholesterol: 97 mg/dL (ref 0–99)
Triglycerides: 140 mg/dL (ref 0.0–149.0)

## 2011-11-20 LAB — MICROALBUMIN / CREATININE URINE RATIO
Creatinine,U: 166.2 mg/dL
Microalb Creat Ratio: 0.8 mg/g (ref 0.0–30.0)
Microalb, Ur: 1.4 mg/dL (ref 0.0–1.9)

## 2011-11-20 MED ORDER — PHENTERMINE HCL 37.5 MG PO CAPS: 37.5000 mg | ORAL_CAPSULE | ORAL | Status: DC

## 2011-11-20 NOTE — Assessment & Plan Note (Addendum)
Pt reports BG are well controlled. Will check A1c with labs today. Encouraged continued efforts at weight loss. Follow up 3 months.

## 2011-11-20 NOTE — Assessment & Plan Note (Signed)
Congratulated pt on another 4lb weight loss. Encouraged continued efforts at healthy diet. Plan to continue phentermine for appetite suppression. Follow up 3 months.

## 2011-11-20 NOTE — Progress Notes (Signed)
  Subjective:    Patient ID: Shelby Estes, female    DOB: 1955-06-29, 56 y.o.   MRN: [id number]  HPI 56YO female with h/o diabetes and obesity presents for follow up. In regards to DM, notes BG typically near 120-150 in the morning and 90-100 in the evening. Continues with efforts at healthy diet and exercise, using phentermine during the week for appetite suppression. No side effects noted.  Outpatient Encounter Prescriptions as of 11/20/2011  Medication Sig Dispense Refill  . Cholecalciferol (VITAMIN D) 2000 UNITS CAPS Take 1 capsule by mouth daily.      . citalopram (CELEXA) 20 MG tablet Take 1 tablet (20 mg total) by mouth daily.  30 tablet  6  . phentermine 37.5 MG capsule Take 1 capsule (37.5 mg total) by mouth every morning.  30 capsule  2  . DISCONTD: phentermine 37.5 MG capsule Take 1 capsule (37.5 mg total) by mouth every morning.  30 capsule  0   BP 138/82  Pulse 97  Temp 98.3 F (36.8 C) (Oral)  Ht 5' 3.5" (1.613 m)  Wt 256 lb 8 oz (116.348 kg)  BMI 44.72 kg/m2  SpO2 98%  Review of Systems  Constitutional: Negative for fever, chills, appetite change, fatigue and unexpected weight change.  HENT: Negative for ear pain, congestion, sore throat, trouble swallowing, neck pain, voice change and sinus pressure.   Eyes: Negative for visual disturbance.  Respiratory: Negative for cough, shortness of breath, wheezing and stridor.   Cardiovascular: Negative for chest pain, palpitations and leg swelling.  Gastrointestinal: Negative for nausea, vomiting, abdominal pain, diarrhea, constipation, blood in stool, abdominal distention and anal bleeding.  Genitourinary: Negative for dysuria and flank pain.  Musculoskeletal: Negative for myalgias, arthralgias and gait problem.  Skin: Negative for color change and rash.  Neurological: Negative for dizziness and headaches.  Hematological: Negative for adenopathy. Does not bruise/bleed easily.  Psychiatric/Behavioral: Negative for suicidal  ideas, disturbed wake/sleep cycle and dysphoric mood. The patient is not nervous/anxious.        Objective:   Physical Exam  Constitutional: She is oriented to person, place, and time. She appears well-developed and well-nourished. No distress.  HENT:  Head: Normocephalic and atraumatic.  Right Ear: External ear normal.  Left Ear: External ear normal.  Nose: Nose normal.  Mouth/Throat: Oropharynx is clear and moist. No oropharyngeal exudate.  Eyes: Conjunctivae are normal. Pupils are equal, round, and reactive to light. Right eye exhibits no discharge. Left eye exhibits no discharge. No scleral icterus.  Neck: Normal range of motion. Neck supple. No tracheal deviation present. No thyromegaly present.  Cardiovascular: Normal rate, regular rhythm, normal heart sounds and intact distal pulses.  Exam reveals no gallop and no friction rub.   No murmur heard. Pulmonary/Chest: Effort normal and breath sounds normal. No respiratory distress. She has no wheezes. She has no rales. She exhibits no tenderness.  Musculoskeletal: Normal range of motion. She exhibits no edema and no tenderness.  Lymphadenopathy:    She has no cervical adenopathy.  Neurological: She is alert and oriented to person, place, and time. No cranial nerve deficit. She exhibits normal muscle tone. Coordination normal.  Skin: Skin is warm and dry. No rash noted. She is not diaphoretic. No erythema. No pallor.  Psychiatric: She has a normal mood and affect. Her behavior is normal. Judgment and thought content normal.          Assessment & Plan:

## 2012-02-19 ENCOUNTER — Encounter: Payer: Self-pay | Admitting: Internal Medicine

## 2012-02-19 ENCOUNTER — Ambulatory Visit (INDEPENDENT_AMBULATORY_CARE_PROVIDER_SITE_OTHER): Payer: PRIVATE HEALTH INSURANCE | Admitting: Internal Medicine

## 2012-02-19 VITALS — BP 130/82 | HR 80 | Temp 98.0°F | Resp 16 | Wt 253.0 lb

## 2012-02-19 DIAGNOSIS — J069 Acute upper respiratory infection, unspecified: Secondary | ICD-10-CM

## 2012-02-19 DIAGNOSIS — F32A Depression, unspecified: Secondary | ICD-10-CM

## 2012-02-19 DIAGNOSIS — E1169 Type 2 diabetes mellitus with other specified complication: Secondary | ICD-10-CM

## 2012-02-19 DIAGNOSIS — F3289 Other specified depressive episodes: Secondary | ICD-10-CM

## 2012-02-19 DIAGNOSIS — E663 Overweight: Secondary | ICD-10-CM

## 2012-02-19 DIAGNOSIS — E669 Obesity, unspecified: Secondary | ICD-10-CM

## 2012-02-19 DIAGNOSIS — F329 Major depressive disorder, single episode, unspecified: Secondary | ICD-10-CM

## 2012-02-19 DIAGNOSIS — E119 Type 2 diabetes mellitus without complications: Secondary | ICD-10-CM

## 2012-02-19 DIAGNOSIS — E66813 Obesity, class 3: Secondary | ICD-10-CM

## 2012-02-19 LAB — COMPREHENSIVE METABOLIC PANEL
ALT: 18 U/L (ref 0–35)
AST: 18 U/L (ref 0–37)
Alkaline Phosphatase: 94 U/L (ref 39–117)
BUN: 13 mg/dL (ref 6–23)
Calcium: 9.1 mg/dL (ref 8.4–10.5)
Creatinine, Ser: 0.8 mg/dL (ref 0.4–1.2)
Total Bilirubin: 0.6 mg/dL (ref 0.3–1.2)

## 2012-02-19 MED ORDER — CITALOPRAM HYDROBROMIDE 20 MG PO TABS: 20.0000 mg | ORAL_TABLET | Freq: Every day | ORAL | Status: DC

## 2012-02-19 MED ORDER — PHENTERMINE HCL 37.5 MG PO CAPS: 37.5000 mg | ORAL_CAPSULE | ORAL | Status: DC

## 2012-02-19 NOTE — Assessment & Plan Note (Signed)
Symptoms are well controlled on Celexa. We'll continue.

## 2012-02-19 NOTE — Assessment & Plan Note (Signed)
Patient reports good control of blood sugars. Will check A1c with labs today. Followup in 3 months or sooner as needed.

## 2012-02-19 NOTE — Assessment & Plan Note (Signed)
BMI 44. Congratulated patient on 3 pound weight loss. Encouraged her to continue efforts at healthy diet and regular physical activity. Will continue phentermine for appetite suppression. Followup in 3 months or sooner as needed.

## 2012-02-19 NOTE — Progress Notes (Signed)
Subjective:    Patient ID: Shelby Estes, female    DOB: 25-Feb-1956, 56 y.o.   MRN: [id number]  HPI 56 year old female with history of obesity, diabetes, depression presents for followup. She reports she is generally feeling well. She continues to limit caloric intake and has tried to increase her physical activity. She has been taking phentermine intermittently for appetite suppression. She has lost another 3 pounds since her last visit. In regards to her diabetes, she reports that blood sugars have been well-controlled. She has a fasting sugars below 150. She also notes that over the last couple of weeks she has had nasal congestion, postnasal drip, nonproductive cough. She denies any fever, chills, chest pain, shortness of breath. She has been taking some over-the-counter Mucinex with some improvement.  Outpatient Encounter Prescriptions as of 02/19/2012  Medication Sig Dispense Refill  . Cholecalciferol (VITAMIN D) 2000 UNITS CAPS Take 1 capsule by mouth daily.      . citalopram (CELEXA) 20 MG tablet Take 1 tablet (20 mg total) by mouth daily.  30 tablet  6  . FLUZONE PRESERVATIVE FREE injection       . phentermine 37.5 MG capsule Take 1 capsule (37.5 mg total) by mouth every morning.  30 capsule  2  . [DISCONTINUED] citalopram (CELEXA) 20 MG tablet Take 1 tablet (20 mg total) by mouth daily.  30 tablet  6  . [DISCONTINUED] phentermine 37.5 MG capsule Take 1 capsule (37.5 mg total) by mouth every morning.  30 capsule  2   BP 130/82  Pulse 80  Temp 98 F (36.7 C) (Oral)  Resp 16  Wt 253 lb (114.76 kg)  Review of Systems  Constitutional: Negative for fever, chills, appetite change, fatigue and unexpected weight change.  HENT: Positive for congestion, rhinorrhea and postnasal drip. Negative for ear pain, sore throat, trouble swallowing, neck pain, voice change and sinus pressure.   Eyes: Negative for visual disturbance.  Respiratory: Positive for cough. Negative for shortness of breath,  wheezing and stridor.   Cardiovascular: Negative for chest pain, palpitations and leg swelling.  Gastrointestinal: Negative for nausea, vomiting, abdominal pain, diarrhea, constipation, blood in stool, abdominal distention and anal bleeding.  Genitourinary: Negative for dysuria and flank pain.  Musculoskeletal: Negative for myalgias, arthralgias and gait problem.  Skin: Negative for color change and rash.  Neurological: Negative for dizziness and headaches.  Hematological: Negative for adenopathy. Does not bruise/bleed easily.  Psychiatric/Behavioral: Negative for suicidal ideas, sleep disturbance and dysphoric mood. The patient is not nervous/anxious.        Objective:   Physical Exam  Constitutional: She is oriented to person, place, and time. She appears well-developed and well-nourished. No distress.  HENT:  Head: Normocephalic and atraumatic.  Right Ear: External ear normal.  Left Ear: External ear normal.  Nose: Nose normal.  Mouth/Throat: Oropharynx is clear and moist. No oropharyngeal exudate.  Eyes: Conjunctivae normal are normal. Pupils are equal, round, and reactive to light. Right eye exhibits no discharge. Left eye exhibits no discharge. No scleral icterus.  Neck: Normal range of motion. Neck supple. No tracheal deviation present. No thyromegaly present.  Cardiovascular: Normal rate, regular rhythm, normal heart sounds and intact distal pulses.  Exam reveals no gallop and no friction rub.   No murmur heard. Pulmonary/Chest: Effort normal and breath sounds normal. No respiratory distress. She has no wheezes. She has no rales. She exhibits no tenderness.  Musculoskeletal: Normal range of motion. She exhibits no edema and no tenderness.  Lymphadenopathy:  She has no cervical adenopathy.  Neurological: She is alert and oriented to person, place, and time. No cranial nerve deficit. She exhibits normal muscle tone. Coordination normal.  Skin: Skin is warm and dry. No rash  noted. She is not diaphoretic. No erythema. No pallor.  Psychiatric: She has a normal mood and affect. Her behavior is normal. Judgment and thought content normal.          Assessment & Plan:

## 2012-02-19 NOTE — Assessment & Plan Note (Signed)
Symptoms are most consistent with viral upper respiratory infection which is improving. Will continue to monitor. If any recurrent fever, persistent cough, or other concerns patient will call or return to clinic.

## 2012-02-19 NOTE — Assessment & Plan Note (Signed)
>>  ASSESSMENT AND PLAN FOR ANXIETY AND DEPRESSION WRITTEN ON 02/19/2012 12:31 PM BY VANNIE, JENNIFER A, MD  Symptoms are well controlled on Celexa . We'll continue.

## 2012-02-20 MED ORDER — METFORMIN HCL 500 MG PO TABS: 500.0000 mg | ORAL_TABLET | Freq: Two times a day (BID) | ORAL | Status: DC

## 2012-02-20 NOTE — Addendum Note (Signed)
Addended by: Ronna Polio A on: 02/20/2012 04:18 PM   Modules accepted: Orders

## 2012-02-26 ENCOUNTER — Encounter: Payer: Self-pay | Admitting: Internal Medicine

## 2012-02-26 MED ORDER — DOXYCYCLINE HYCLATE 100 MG PO TABS: 100.0000 mg | ORAL_TABLET | Freq: Two times a day (BID) | ORAL | Status: DC

## 2012-05-24 ENCOUNTER — Ambulatory Visit: Payer: PRIVATE HEALTH INSURANCE | Admitting: Internal Medicine

## 2012-06-23 ENCOUNTER — Emergency Department: Payer: Self-pay | Admitting: Emergency Medicine

## 2012-06-23 LAB — URINALYSIS, COMPLETE
Bilirubin,UR: NEGATIVE
Blood: NEGATIVE
Leukocyte Esterase: NEGATIVE
Nitrite: NEGATIVE
Ph: 9 (ref 4.5–8.0)
Protein: NEGATIVE
RBC,UR: 4 /HPF (ref 0–5)
Specific Gravity: 1.011 (ref 1.003–1.030)
Squamous Epithelial: 1
WBC UR: 1 /HPF (ref 0–5)

## 2012-06-23 LAB — CBC
HCT: 38.9 % (ref 35.0–47.0)
MCV: 83 fL (ref 80–100)
Platelet: 477 10*3/uL — ABNORMAL HIGH (ref 150–440)
RDW: 14.2 % (ref 11.5–14.5)
WBC: 14.6 10*3/uL — ABNORMAL HIGH (ref 3.6–11.0)

## 2012-06-23 LAB — BASIC METABOLIC PANEL
Calcium, Total: 8.9 mg/dL (ref 8.5–10.1)
Chloride: 106 mmol/L (ref 98–107)
Co2: 23 mmol/L (ref 21–32)
Creatinine: 1 mg/dL (ref 0.60–1.30)
Osmolality: 279 (ref 275–301)
Potassium: 3.3 mmol/L — ABNORMAL LOW (ref 3.5–5.1)

## 2012-07-06 ENCOUNTER — Encounter: Payer: Self-pay | Admitting: Internal Medicine

## 2012-07-26 ENCOUNTER — Encounter: Payer: Self-pay | Admitting: Internal Medicine

## 2012-07-26 ENCOUNTER — Ambulatory Visit (INDEPENDENT_AMBULATORY_CARE_PROVIDER_SITE_OTHER): Payer: PRIVATE HEALTH INSURANCE | Admitting: Internal Medicine

## 2012-07-26 VITALS — BP 120/80 | HR 90 | Temp 97.9°F | Wt 254.0 lb

## 2012-07-26 DIAGNOSIS — R141 Gas pain: Secondary | ICD-10-CM

## 2012-07-26 DIAGNOSIS — E119 Type 2 diabetes mellitus without complications: Secondary | ICD-10-CM

## 2012-07-26 DIAGNOSIS — E1169 Type 2 diabetes mellitus with other specified complication: Secondary | ICD-10-CM

## 2012-07-26 DIAGNOSIS — E669 Obesity, unspecified: Secondary | ICD-10-CM

## 2012-07-26 DIAGNOSIS — R300 Dysuria: Secondary | ICD-10-CM

## 2012-07-26 DIAGNOSIS — R140 Abdominal distension (gaseous): Secondary | ICD-10-CM | POA: Insufficient documentation

## 2012-07-26 LAB — COMPREHENSIVE METABOLIC PANEL
ALT: 17 U/L (ref 0–35)
Albumin: 3.9 g/dL (ref 3.5–5.2)
CO2: 26 mEq/L (ref 19–32)
Calcium: 9.3 mg/dL (ref 8.4–10.5)
Chloride: 106 mEq/L (ref 96–112)
GFR: 75.48 mL/min (ref >60.00)
Sodium: 141 mEq/L (ref 135–145)
Total Protein: 7.6 g/dL (ref 6.0–8.3)

## 2012-07-26 LAB — POCT URINALYSIS DIPSTICK
Bilirubin, UA: NEGATIVE
Ketones, UA: NEGATIVE
pH, UA: 5.5

## 2012-07-26 LAB — CBC WITH DIFFERENTIAL/PLATELET
Eosinophils Absolute: 0.1 10*3/uL (ref 0.0–0.7)
MCHC: 33.8 g/dL (ref 30.0–36.0)
MCV: 83.8 fl (ref 78.0–100.0)
Monocytes Absolute: 0.4 10*3/uL (ref 0.1–1.0)
Neutrophils Relative %: 65.7 % (ref 43.0–77.0)
Platelets: 455 10*3/uL — ABNORMAL HIGH (ref 150.0–400.0)

## 2012-07-26 MED ORDER — CIPROFLOXACIN HCL 500 MG PO TABS: 500.0000 mg | ORAL_TABLET | Freq: Two times a day (BID) | ORAL | Status: DC

## 2012-07-26 MED ORDER — PHENTERMINE-TOPIRAMATE ER 7.5-46 MG PO CP24: 1.0000 | ORAL_CAPSULE | Freq: Every day | ORAL | Status: DC

## 2012-07-26 MED ORDER — PHENTERMINE-TOPIRAMATE ER 3.75-23 MG PO CP24: 1.0000 | ORAL_CAPSULE | Freq: Every day | ORAL | Status: DC

## 2012-07-26 NOTE — Progress Notes (Signed)
Subjective:    Patient ID: Shelby Estes, female    DOB: 1956/02/03, 57 y.o.   MRN: [id number]  HPI 57 year old female with history of diabetes, obesity, depression presents for followup. In regards to diabetes, she reports compliance with metformin. She did not bring record of her blood sugars today but reports they have been well-controlled. She continues to try to follow a healthy diet. She has not lost any weight since her last visit. She has only intermittently been using phentermine.  She has several concerns today. First, she is worried about several week history of increased symptoms of acid reflux and diffuse abdominal bloating with occasional epigastric pain. This does not correlate to any specific foods. She denies any nausea or vomiting. She denies any change in bowel habits. She occasionally takes some omeprazole with some improvement.  Second, she is concerned about several days of increased urinary frequency and burning with urination. She denies any gross blood in her urine. She denies any fever, chills, flank pain.  Outpatient Encounter Prescriptions as of 07/26/2012  Medication Sig Dispense Refill  . Cholecalciferol (VITAMIN D) 2000 UNITS CAPS Take 1 capsule by mouth daily.      . citalopram (CELEXA) 20 MG tablet Take 1 tablet (20 mg total) by mouth daily.  30 tablet  6  . metFORMIN (GLUCOPHAGE) 500 MG tablet Take 1 tablet (500 mg total) by mouth 2 (two) times daily with a meal.  180 tablet  3  . naproxen sodium (ANAPROX) 550 MG tablet       . phentermine 37.5 MG capsule Take 1 capsule (37.5 mg total) by mouth every morning.  30 capsule  2  . [DISCONTINUED] promethazine (PHENERGAN) 25 MG tablet       . ciprofloxacin (CIPRO) 500 MG tablet Take 1 tablet (500 mg total) by mouth 2 (two) times daily.  14 tablet  0  . FLUZONE PRESERVATIVE FREE injection        No facility-administered encounter medications on file as of 07/26/2012.   BP 120/80  Pulse 90  Temp(Src) 97.9 F (36.6  C) (Oral)  Wt 254 lb (115.214 kg)  BMI 44.28 kg/m2  SpO2 97%  Review of Systems  Constitutional: Negative for fever, chills, appetite change, fatigue and unexpected weight change.  HENT: Negative for ear pain, congestion, sore throat, trouble swallowing, neck pain, voice change and sinus pressure.   Eyes: Negative for visual disturbance.  Respiratory: Negative for cough, shortness of breath, wheezing and stridor.   Cardiovascular: Negative for chest pain, palpitations and leg swelling.  Gastrointestinal: Positive for abdominal pain and abdominal distention. Negative for nausea, vomiting, diarrhea, constipation, blood in stool and anal bleeding.  Genitourinary: Positive for dysuria, urgency and frequency. Negative for hematuria, flank pain and pelvic pain.  Musculoskeletal: Negative for myalgias, arthralgias and gait problem.  Skin: Negative for color change and rash.  Neurological: Negative for dizziness and headaches.  Hematological: Negative for adenopathy. Does not bruise/bleed easily.  Psychiatric/Behavioral: Negative for suicidal ideas, sleep disturbance and dysphoric mood. The patient is not nervous/anxious.        Objective:   Physical Exam  Constitutional: She is oriented to person, place, and time. She appears well-developed and well-nourished. No distress.  HENT:  Head: Normocephalic and atraumatic.  Right Ear: External ear normal.  Left Ear: External ear normal.  Nose: Nose normal.  Mouth/Throat: Oropharynx is clear and moist. No oropharyngeal exudate.  Eyes: Conjunctivae are normal. Pupils are equal, round, and reactive to light. Right eye exhibits no  discharge. Left eye exhibits no discharge. No scleral icterus.  Neck: Normal range of motion. Neck supple. No tracheal deviation present. No thyromegaly present.  Cardiovascular: Normal rate, regular rhythm, normal heart sounds and intact distal pulses.  Exam reveals no gallop and no friction rub.   No murmur  heard. Pulmonary/Chest: Effort normal and breath sounds normal. No accessory muscle usage. Not tachypneic. No respiratory distress. She has no decreased breath sounds. She has no wheezes. She has no rhonchi. She has no rales. She exhibits no tenderness.  Abdominal: Soft. Bowel sounds are normal. She exhibits no distension and no mass. There is tenderness (mild epigastric area). There is no rebound and no guarding.  Musculoskeletal: Normal range of motion. She exhibits no edema and no tenderness.  Lymphadenopathy:    She has no cervical adenopathy.  Neurological: She is alert and oriented to person, place, and time. No cranial nerve deficit. She exhibits normal muscle tone. Coordination normal.  Skin: Skin is warm and dry. No rash noted. She is not diaphoretic. No erythema. No pallor.  Psychiatric: She has a normal mood and affect. Her behavior is normal. Judgment and thought content normal.          Assessment & Plan:

## 2012-07-26 NOTE — Assessment & Plan Note (Signed)
Diffuse abdominal bloating and symptoms of GERD. Will check H. Pylori with labs. Encouraged use of OTC omeprazole 20mg  daily. Follow up for recheck 4 weeks. If no improvement, consider referral for EGD.

## 2012-07-26 NOTE — Assessment & Plan Note (Signed)
Dysuria and urinary urgency. Urinalysis pos for blood and leuk. Will send urine for culture. Will treat with cipro. Pt will call if symptoms not improving.

## 2012-07-26 NOTE — Assessment & Plan Note (Signed)
Wt Readings from Last 3 Encounters:  07/26/12 254 lb (115.214 kg)  02/19/12 253 lb (114.76 kg)  11/20/11 256 lb 8 oz (116.348 kg)   Weight relatively stable despite use of phentermine. Will try changing to Qsymia for appetite suppression. Encouraged keeping a food diary. Encouraged increase in physical activity. If no improvement, consider bariatric surgery.

## 2012-07-26 NOTE — Assessment & Plan Note (Signed)
Lab Results  Component Value Date   HGBA1C 6.5 07/26/2012   Blood sugars stable. Encouraged continued effort at healthy diet and regular physical activity. Continue metformin. Follow up 3 months and prn.

## 2012-07-28 LAB — HELICOBACTER PYLORI  ANTIBODY, IGM: Helicobacter pylori, IgM: 1.6 U/mL (ref <9.0)

## 2012-07-28 LAB — URINE CULTURE: Colony Count: NO GROWTH

## 2012-07-29 ENCOUNTER — Telehealth: Payer: Self-pay | Admitting: Internal Medicine

## 2012-07-29 ENCOUNTER — Encounter: Payer: Self-pay | Admitting: *Deleted

## 2012-07-29 NOTE — Telephone Encounter (Signed)
Needs follow up visit tomorrow if worsening symptoms of dysuria.

## 2012-07-29 NOTE — Telephone Encounter (Signed)
Patient Information:  Caller Name: Briah  Phone: [phone]  Patient: Donata, Reddick  Gender: Female  DOB: November 13, 1955  Age: 57 Years  PCP: Ronna Polio (Adults only)  Office Follow Up:  Does the office need to follow up with this patient?: Yes  Instructions For The Office: Please follow up.  RN Note:  Called to ask when to have urine reteted.  Currently on Cipro for dysuria and urgency.  Today noted blood in urie when wiped.  FBS not tested 07/28/12 or 07/29/12.  Declined to complete triage due to driving now to pick up grandchild in 3 minutes;  stated will call Dr Dan Humphreys 07/30/12.  Symptoms  Reason For Call & Symptoms: On Cipro for urine infection.  Saw note in My Chart dated 07/28/12 advising recheck of urine; culture was negative.   Asking when to be reseen.  Reports still feels like a "kidney infection" with slight pain in back an sides and blood on toilet tissue when wiped.  History of kidney stone. Urgency has improved.  No dysuria.  Reviewed Health History In EMR: Yes  Reviewed Medications In EMR: Yes  Reviewed Allergies In EMR: Yes  Reviewed Surgeries / Procedures: Yes  Date of Onset of Symptoms: 07/29/2012  Guideline(s) Used:  No Protocol Available - Sick Adult  Disposition Per Guideline:   See Today in Office  Reason For Disposition Reached:   Nursing judgment  Advice Given:  N/A  Patient Refused Recommendation:  Patient Will Follow Up With Office Later  Declined triage; will call office 07/29/12.

## 2012-07-30 ENCOUNTER — Encounter: Payer: Self-pay | Admitting: Internal Medicine

## 2012-07-30 NOTE — Telephone Encounter (Signed)
Spoke with patient, but phone connection was not very good, patient will call back once she gets to a better area.

## 2012-07-30 NOTE — Telephone Encounter (Signed)
Spoke with patient, she stated she is still having some of the symptoms but they are not as bad. Offered an appointment to come in today but declined. She will see how things go over the weekend then call back on Monday if she need to.

## 2012-08-04 ENCOUNTER — Emergency Department: Payer: Self-pay

## 2012-08-04 LAB — CK TOTAL AND CKMB (NOT AT ARMC): CK, Total: 72 U/L (ref 21–215)

## 2012-08-04 LAB — CBC
HCT: 41.8 % (ref 35.0–47.0)
HGB: 14 g/dL (ref 12.0–16.0)
MCH: 28.1 pg (ref 26.0–34.0)
MCHC: 33.6 g/dL (ref 32.0–36.0)
MCV: 84 fL (ref 80–100)
Platelet: 474 10*3/uL — ABNORMAL HIGH (ref 150–440)
RBC: 5 10*6/uL (ref 3.80–5.20)
RDW: 14.3 % (ref 11.5–14.5)

## 2012-08-04 LAB — BASIC METABOLIC PANEL
Anion Gap: 11 (ref 7–16)
BUN: 10 mg/dL (ref 7–18)
Co2: 24 mmol/L (ref 21–32)
EGFR (African American): >60
EGFR (Non-African Amer.): >60
Glucose: 153 mg/dL — ABNORMAL HIGH (ref 65–99)
Sodium: 140 mmol/L (ref 136–145)

## 2012-08-16 ENCOUNTER — Encounter: Payer: Self-pay | Admitting: Internal Medicine

## 2012-08-25 ENCOUNTER — Ambulatory Visit: Payer: PRIVATE HEALTH INSURANCE | Admitting: Internal Medicine

## 2012-09-06 ENCOUNTER — Encounter: Payer: Self-pay | Admitting: Internal Medicine

## 2012-09-08 ENCOUNTER — Telehealth: Payer: Self-pay | Admitting: Internal Medicine

## 2012-09-08 NOTE — Telephone Encounter (Signed)
I am not here in clinic on 6/27. We could try to schedule her a visit next week, or she could see Raquel on Friday. Either way is fine.

## 2012-09-08 NOTE — Telephone Encounter (Signed)
Pt called to cancel appointment with raquel tomorrow.  And wanted to see you for    I am still having some bleeding and am unsure if it is in urine or discharge/rbh per my chart.  Pt stated she would rather see you.  Please advise appointment date and time thanks

## 2012-09-09 ENCOUNTER — Ambulatory Visit: Payer: PRIVATE HEALTH INSURANCE | Admitting: Adult Health

## 2012-09-09 NOTE — Telephone Encounter (Signed)
Spoke with patient, all she really need to do is give repeat urine sample since her last sample contained blood. She will be in tomorrow for UA then move forward from that.

## 2012-09-10 ENCOUNTER — Other Ambulatory Visit (INDEPENDENT_AMBULATORY_CARE_PROVIDER_SITE_OTHER): Payer: PRIVATE HEALTH INSURANCE

## 2012-09-10 ENCOUNTER — Ambulatory Visit: Payer: PRIVATE HEALTH INSURANCE | Admitting: Adult Health

## 2012-09-10 DIAGNOSIS — R319 Hematuria, unspecified: Secondary | ICD-10-CM

## 2012-09-10 LAB — POCT URINALYSIS DIPSTICK
Bilirubin, UA: NEGATIVE
Glucose, UA: NEGATIVE
Nitrite, UA: NEGATIVE
Urobilinogen, UA: 0.2

## 2012-09-13 ENCOUNTER — Ambulatory Visit: Payer: PRIVATE HEALTH INSURANCE | Admitting: Adult Health

## 2012-09-14 ENCOUNTER — Encounter: Payer: Self-pay | Admitting: Internal Medicine

## 2012-11-02 ENCOUNTER — Other Ambulatory Visit: Payer: Self-pay | Admitting: Internal Medicine

## 2013-01-20 ENCOUNTER — Other Ambulatory Visit: Payer: Self-pay

## 2013-01-26 ENCOUNTER — Other Ambulatory Visit: Payer: Self-pay | Admitting: Internal Medicine

## 2013-01-27 ENCOUNTER — Telehealth: Payer: Self-pay | Admitting: Internal Medicine

## 2013-01-27 NOTE — Telephone Encounter (Signed)
Pt states pharmacy faxed request for citalopram yesterday.  Pt wants to be sure this is filled today as she is going out of town tomorrow.

## 2013-01-27 NOTE — Telephone Encounter (Signed)
This was done on 01/26/13

## 2013-03-01 ENCOUNTER — Encounter: Payer: Self-pay | Admitting: Internal Medicine

## 2013-03-01 ENCOUNTER — Telehealth: Payer: Self-pay | Admitting: Internal Medicine

## 2013-03-01 ENCOUNTER — Ambulatory Visit (INDEPENDENT_AMBULATORY_CARE_PROVIDER_SITE_OTHER): Payer: PRIVATE HEALTH INSURANCE | Admitting: Internal Medicine

## 2013-03-01 VITALS — BP 120/70 | HR 68 | Temp 98.5°F | Ht 64.0 in | Wt 271.2 lb

## 2013-03-01 DIAGNOSIS — E119 Type 2 diabetes mellitus without complications: Secondary | ICD-10-CM

## 2013-03-01 DIAGNOSIS — Z0000 Encounter for general adult medical examination without abnormal findings: Secondary | ICD-10-CM

## 2013-03-01 DIAGNOSIS — N939 Abnormal uterine and vaginal bleeding, unspecified: Secondary | ICD-10-CM | POA: Insufficient documentation

## 2013-03-01 DIAGNOSIS — E1169 Type 2 diabetes mellitus with other specified complication: Secondary | ICD-10-CM

## 2013-03-01 DIAGNOSIS — N898 Other specified noninflammatory disorders of vagina: Secondary | ICD-10-CM

## 2013-03-01 LAB — CBC WITH DIFFERENTIAL/PLATELET
Basophils Absolute: 0 10*3/uL (ref 0.0–0.1)
Eosinophils Absolute: 0.1 10*3/uL (ref 0.0–0.7)
HCT: 38.3 % (ref 36.0–46.0)
Hemoglobin: 12.8 g/dL (ref 12.0–15.0)
Lymphocytes Relative: 21 % (ref 12.0–46.0)
MCHC: 33.6 g/dL (ref 30.0–36.0)
Monocytes Relative: 5.8 % (ref 3.0–12.0)
Neutro Abs: 6.9 10*3/uL (ref 1.4–7.7)
Neutrophils Relative %: 71.7 % (ref 43.0–77.0)
RDW: 14.6 % (ref 11.5–14.6)
WBC: 9.7 10*3/uL (ref 4.5–10.5)

## 2013-03-01 LAB — COMPREHENSIVE METABOLIC PANEL
ALT: 18 U/L (ref 0–35)
AST: 19 U/L (ref 0–37)
Albumin: 4 g/dL (ref 3.5–5.2)
Alkaline Phosphatase: 84 U/L (ref 39–117)
Calcium: 8.8 mg/dL (ref 8.4–10.5)
Chloride: 103 mEq/L (ref 96–112)
Potassium: 3.8 mEq/L (ref 3.5–5.1)
Sodium: 138 mEq/L (ref 135–145)
Total Protein: 7.2 g/dL (ref 6.0–8.3)

## 2013-03-01 LAB — LIPID PANEL
LDL Cholesterol: 116 mg/dL — ABNORMAL HIGH (ref 0–99)
Total CHOL/HDL Ratio: 5

## 2013-03-01 LAB — MICROALBUMIN / CREATININE URINE RATIO
Creatinine,U: 137.9 mg/dL
Microalb Creat Ratio: 0.3 mg/g (ref 0.0–30.0)
Microalb, Ur: 0.4 mg/dL (ref 0.0–1.9)

## 2013-03-01 LAB — HEMOGLOBIN A1C: Hgb A1c MFr Bld: 6.5 % (ref 4.6–6.5)

## 2013-03-01 LAB — TSH: TSH: 1.49 u[IU]/mL (ref 0.35–5.50)

## 2013-03-01 MED ORDER — FLUTICASONE PROPIONATE 50 MCG/ACT NA SUSP: 2.0000 | Freq: Every day | NASAL | Status: DC

## 2013-03-01 MED ORDER — CITALOPRAM HYDROBROMIDE 20 MG PO TABS: 20.0000 mg | ORAL_TABLET | Freq: Every day | ORAL | Status: DC

## 2013-03-01 NOTE — Progress Notes (Signed)
Pre-visit discussion using our clinic review tool. No additional management support is needed unless otherwise documented below in the visit note.  

## 2013-03-01 NOTE — Progress Notes (Signed)
Subjective:    Patient ID: Shelby Estes, female    DOB: March 22, 1955, 57 y.o.   MRN: [id number]  HPI 57YO female presents for annual exam. She is generally feeling well. Earlier this year, she had an episode of SVT and was evaluated in the ED and had adenosine cardioversion. She was then started on metoprolol. She also reports having a stress test which was normal. No further episodes of palpitations. She has not been exercising. She has not been following any particular diet. She reports her BG have been near 150 in the mornings and 90s in the afternoons.   She notes that earlier this year, she had some episodes of bright red blood on the toilet paper with wiping. She thinks this may have been related to a kidney stone at the time, but she could not tell if it was vaginal or from her urethra. She denies pelvic pain, bloating, vaginal discharge. No recent symptoms of bleeding.  Outpatient Encounter Prescriptions as of 03/01/2013  Medication Sig  . Cholecalciferol (VITAMIN D) 2000 UNITS CAPS Take 1 capsule by mouth daily.  . citalopram (CELEXA) 20 MG tablet Take 1 tablet (20 mg total) by mouth daily.  . furosemide (LASIX) 20 MG tablet Take 20 mg by mouth daily as needed for edema.  . metoprolol tartrate (LOPRESSOR) 25 MG tablet Take 25 mg by mouth 2 (two) times daily.  . fluticasone (FLONASE) 50 MCG/ACT nasal spray Place 2 sprays into both nostrils daily.   BP 120/70  Pulse 68  Temp(Src) 98.5 F (36.9 C) (Oral)  Ht 5\' 4"  (1.626 m)  Wt 271 lb 4 oz (123.038 kg)  BMI 46.54 kg/m2  SpO2 95%  Review of Systems  Constitutional: Negative for fever, chills, appetite change, fatigue and unexpected weight change.  HENT: Negative for congestion, ear pain, sinus pressure, sore throat, trouble swallowing and voice change.   Eyes: Negative for visual disturbance.  Respiratory: Negative for cough, shortness of breath, wheezing and stridor.   Cardiovascular: Negative for chest pain, palpitations and  leg swelling.  Gastrointestinal: Negative for nausea, vomiting, abdominal pain, diarrhea, constipation, blood in stool, abdominal distention and anal bleeding.  Genitourinary: Positive for vaginal bleeding. Negative for dysuria, flank pain, genital sores and pelvic pain.  Musculoskeletal: Negative for arthralgias, gait problem, myalgias and neck pain.  Skin: Negative for color change and rash.  Neurological: Negative for dizziness and headaches.  Hematological: Negative for adenopathy. Does not bruise/bleed easily.  Psychiatric/Behavioral: Negative for suicidal ideas, sleep disturbance and dysphoric mood. The patient is not nervous/anxious.        Objective:   Physical Exam  Constitutional: She is oriented to person, place, and time. She appears well-developed and well-nourished. No distress.  HENT:  Head: Normocephalic and atraumatic.  Right Ear: External ear normal.  Left Ear: External ear normal.  Nose: Nose normal.  Mouth/Throat: Oropharynx is clear and moist. No oropharyngeal exudate.  Eyes: Conjunctivae are normal. Pupils are equal, round, and reactive to light. Right eye exhibits no discharge. Left eye exhibits no discharge. No scleral icterus.  Neck: Normal range of motion. Neck supple. No tracheal deviation present. No thyromegaly present.  Cardiovascular: Normal rate, regular rhythm, normal heart sounds and intact distal pulses.  Exam reveals no gallop and no friction rub.   No murmur heard. Pulmonary/Chest: Effort normal and breath sounds normal. No accessory muscle usage. Not tachypneic. No respiratory distress. She has no decreased breath sounds. She has no wheezes. She has no rhonchi. She has no rales.  She exhibits no tenderness. Right breast exhibits no inverted nipple, no mass, no nipple discharge, no skin change and no tenderness. Left breast exhibits no inverted nipple, no mass, no nipple discharge, no skin change and no tenderness. Breasts are symmetrical.  Abdominal:  Soft. Bowel sounds are normal. She exhibits no distension and no mass. There is no tenderness. There is no rebound and no guarding.  Musculoskeletal: Normal range of motion. She exhibits no edema and no tenderness.  Lymphadenopathy:    She has no cervical adenopathy.  Neurological: She is alert and oriented to person, place, and time. No cranial nerve deficit. She exhibits normal muscle tone. Coordination normal.  Skin: Skin is warm and dry. No rash noted. She is not diaphoretic. No erythema. No pallor.  Psychiatric: She has a normal mood and affect. Her behavior is normal. Judgment and thought content normal.          Assessment & Plan:

## 2013-03-01 NOTE — Assessment & Plan Note (Signed)
Will check A1c with labs today. 

## 2013-03-01 NOTE — Assessment & Plan Note (Signed)
Wt Readings from Last 3 Encounters:  03/01/13 271 lb 4 oz (123.038 kg)  07/26/12 254 lb (115.214 kg)  02/19/12 253 lb (114.76 kg)   Body mass index is 46.54 kg/(m^2).  Encouraged healthy diet and exercise with goal of weight loss. Encouraged her to set goal of week of aerobic exercise.

## 2013-03-01 NOTE — Assessment & Plan Note (Signed)
General medical exam normal today including breast exam except as noted. PAP and pelvic deferred given PAP normal, HPV neg in 2013. Mammogram ordered. Colonoscopy UTD. Immunizations UTD. Will check labs today including CBC, CMP, lipids, A1c, TSH, urine microalbumin. Encouraged healthy diet and exercise with goal of weight loss.

## 2013-03-01 NOTE — Assessment & Plan Note (Signed)
Pt recently noted blood on tissue paper with wiping. Unclear if this is vaginal blood or was related to recent kidney stone. Will get pelvic US to evaluate uterine lining. Discussed possible endometrial biopsy.

## 2013-03-01 NOTE — Telephone Encounter (Signed)
Follow up abnormal stress test

## 2013-03-08 ENCOUNTER — Telehealth: Payer: Self-pay | Admitting: *Deleted

## 2013-03-08 ENCOUNTER — Encounter: Payer: Self-pay | Admitting: Internal Medicine

## 2013-03-08 DIAGNOSIS — N950 Postmenopausal bleeding: Secondary | ICD-10-CM

## 2013-03-08 DIAGNOSIS — N841 Polyp of cervix uteri: Secondary | ICD-10-CM

## 2013-03-08 NOTE — Telephone Encounter (Signed)
Discussed recent pelvic ultrasound with Dr. Nedra Hai in radiology. They question possible polyp in the cervix, which may be causing bleeding. I would like to set up a GYN evaluation. Can you ask pt who she would prefer to see?

## 2013-03-08 NOTE — Telephone Encounter (Signed)
Dr. Nedra Hai from [facility] called stating patient has something in her cervical canal. Would like for you to return her call if you would like to discuss this further.

## 2013-03-08 NOTE — Telephone Encounter (Signed)
Left message for pt to return my call.

## 2013-03-09 NOTE — Telephone Encounter (Signed)
Spoke with patient informed her of results. She does not have a preference, whomever you prefer will be ok with her.

## 2013-03-14 ENCOUNTER — Encounter: Payer: Self-pay | Admitting: Emergency Medicine

## 2013-04-01 ENCOUNTER — Encounter: Payer: Self-pay | Admitting: Internal Medicine

## 2013-05-31 ENCOUNTER — Ambulatory Visit: Payer: PRIVATE HEALTH INSURANCE | Admitting: Internal Medicine

## 2013-08-09 ENCOUNTER — Ambulatory Visit: Payer: Self-pay | Admitting: Obstetrics and Gynecology

## 2013-08-09 DIAGNOSIS — I059 Rheumatic mitral valve disease, unspecified: Secondary | ICD-10-CM

## 2013-08-09 DIAGNOSIS — Z01810 Encounter for preprocedural cardiovascular examination: Secondary | ICD-10-CM

## 2013-08-09 LAB — BASIC METABOLIC PANEL
ANION GAP: 4 — AB (ref 7–16)
BUN: 13 mg/dL (ref 7–18)
CREATININE: 0.9 mg/dL (ref 0.60–1.30)
Calcium, Total: 8.9 mg/dL (ref 8.5–10.1)
Chloride: 107 mmol/L (ref 98–107)
Co2: 28 mmol/L (ref 21–32)
EGFR (African American): >60
EGFR (Non-African Amer.): >60
Glucose: 124 mg/dL — ABNORMAL HIGH (ref 65–99)
OSMOLALITY: 279 (ref 275–301)
POTASSIUM: 4 mmol/L (ref 3.5–5.1)
Sodium: 139 mmol/L (ref 136–145)

## 2013-08-09 LAB — CBC
HCT: 39 % (ref 35.0–47.0)
HGB: 12.9 g/dL (ref 12.0–16.0)
MCH: 28.6 pg (ref 26.0–34.0)
MCHC: 33 g/dL (ref 32.0–36.0)
MCV: 87 fL (ref 80–100)
Platelet: 375 10*3/uL (ref 150–440)
RBC: 4.5 10*6/uL (ref 3.80–5.20)
RDW: 14.4 % (ref 11.5–14.5)
WBC: 8.8 10*3/uL (ref 3.6–11.0)

## 2013-08-15 ENCOUNTER — Ambulatory Visit: Payer: Self-pay | Admitting: Obstetrics and Gynecology

## 2013-08-16 LAB — PATHOLOGY REPORT

## 2013-09-13 ENCOUNTER — Ambulatory Visit: Payer: Self-pay | Admitting: Gynecologic Oncology

## 2013-09-14 ENCOUNTER — Ambulatory Visit: Payer: Self-pay | Admitting: Gynecologic Oncology

## 2013-09-22 ENCOUNTER — Ambulatory Visit: Payer: Self-pay | Admitting: Gynecologic Oncology

## 2013-09-22 LAB — CBC
HCT: 36.5 % (ref 35.0–47.0)
HGB: 12.2 g/dL (ref 12.0–16.0)
MCH: 29.4 pg (ref 26.0–34.0)
MCHC: 33.5 g/dL (ref 32.0–36.0)
MCV: 88 fL (ref 80–100)
PLATELETS: 411 10*3/uL (ref 150–440)
RBC: 4.16 10*6/uL (ref 3.80–5.20)
RDW: 14.3 % (ref 11.5–14.5)
WBC: 9.8 10*3/uL (ref 3.6–11.0)

## 2013-09-22 LAB — BASIC METABOLIC PANEL
ANION GAP: 8 (ref 7–16)
BUN: 10 mg/dL (ref 7–18)
CHLORIDE: 104 mmol/L (ref 98–107)
CREATININE: 0.87 mg/dL (ref 0.60–1.30)
Calcium, Total: 9.8 mg/dL (ref 8.5–10.1)
Co2: 28 mmol/L (ref 21–32)
Glucose: 105 mg/dL — ABNORMAL HIGH (ref 65–99)
OSMOLALITY: 279 (ref 275–301)
POTASSIUM: 3.8 mmol/L (ref 3.5–5.1)
Sodium: 140 mmol/L (ref 136–145)

## 2013-09-27 ENCOUNTER — Ambulatory Visit: Payer: Self-pay | Admitting: Obstetrics and Gynecology

## 2013-09-27 HISTORY — PX: ABDOMINAL HYSTERECTOMY: SHX81

## 2013-09-28 LAB — HEMOGLOBIN: HGB: 11.9 g/dL — ABNORMAL LOW (ref 12.0–16.0)

## 2013-09-29 LAB — PATHOLOGY REPORT

## 2013-10-13 ENCOUNTER — Encounter: Payer: Self-pay | Admitting: *Deleted

## 2013-10-17 NOTE — Telephone Encounter (Signed)
Mailed unread message to pt  

## 2013-10-25 ENCOUNTER — Other Ambulatory Visit: Payer: Self-pay | Admitting: *Deleted

## 2013-10-25 ENCOUNTER — Telehealth: Payer: Self-pay | Admitting: *Deleted

## 2013-10-25 DIAGNOSIS — E119 Type 2 diabetes mellitus without complications: Secondary | ICD-10-CM

## 2013-10-25 NOTE — Telephone Encounter (Signed)
CMP, Lipids, A1c 250.00

## 2013-10-25 NOTE — Telephone Encounter (Signed)
Pt is coming in tomorrow what labs and dx?  

## 2013-10-26 ENCOUNTER — Encounter: Payer: Self-pay | Admitting: Internal Medicine

## 2013-10-26 ENCOUNTER — Other Ambulatory Visit (INDEPENDENT_AMBULATORY_CARE_PROVIDER_SITE_OTHER): Payer: PRIVATE HEALTH INSURANCE

## 2013-10-26 DIAGNOSIS — Z1211 Encounter for screening for malignant neoplasm of colon: Secondary | ICD-10-CM

## 2013-10-26 DIAGNOSIS — E119 Type 2 diabetes mellitus without complications: Secondary | ICD-10-CM

## 2013-10-26 LAB — HEMOGLOBIN A1C: HEMOGLOBIN A1C: 6.8 % — AB (ref 4.6–6.5)

## 2013-10-26 LAB — COMPREHENSIVE METABOLIC PANEL
ALK PHOS: 94 U/L (ref 39–117)
ALT: 18 U/L (ref 0–35)
AST: 21 U/L (ref 0–37)
Albumin: 3.8 g/dL (ref 3.5–5.2)
BUN: 14 mg/dL (ref 6–23)
CO2: 26 meq/L (ref 19–32)
CREATININE: 0.8 mg/dL (ref 0.4–1.2)
Calcium: 9.2 mg/dL (ref 8.4–10.5)
Chloride: 105 mEq/L (ref 96–112)
GFR: 84.46 mL/min (ref >60.00)
Glucose, Bld: 128 mg/dL — ABNORMAL HIGH (ref 70–99)
Potassium: 4 mEq/L (ref 3.5–5.1)
SODIUM: 139 meq/L (ref 135–145)
TOTAL PROTEIN: 7.1 g/dL (ref 6.0–8.3)
Total Bilirubin: 0.6 mg/dL (ref 0.2–1.2)

## 2013-10-26 LAB — LIPID PANEL
CHOL/HDL RATIO: 5
Cholesterol: 199 mg/dL (ref 0–200)
HDL: 38.9 mg/dL — AB (ref >39.00)
LDL CALC: 128 mg/dL — AB (ref 0–99)
NONHDL: 160.1
Triglycerides: 163 mg/dL — ABNORMAL HIGH (ref 0.0–149.0)
VLDL: 32.6 mg/dL (ref 0.0–40.0)

## 2013-11-15 ENCOUNTER — Ambulatory Visit (INDEPENDENT_AMBULATORY_CARE_PROVIDER_SITE_OTHER): Payer: PRIVATE HEALTH INSURANCE | Admitting: General Surgery

## 2013-11-15 ENCOUNTER — Encounter: Payer: Self-pay | Admitting: General Surgery

## 2013-11-15 VITALS — BP 118/78 | HR 80 | Resp 16 | Ht 64.0 in | Wt 287.0 lb

## 2013-11-15 DIAGNOSIS — Z1211 Encounter for screening for malignant neoplasm of colon: Secondary | ICD-10-CM

## 2013-11-15 MED ORDER — POLYETHYLENE GLYCOL 3350 17 GM/SCOOP PO POWD: 1.0000 | Freq: Once | ORAL | Status: DC

## 2013-11-15 NOTE — Patient Instructions (Addendum)
Colonoscopy A colonoscopy is an exam to look at the entire large intestine (colon). This exam can help find problems such as tumors, polyps, inflammation, and areas of bleeding. The exam takes about 1 hour.  LET [facility] CARE PROVIDER KNOW ABOUT:   Any allergies you have.  All medicines you are taking, including vitamins, herbs, eye drops, creams, and over-the-counter medicines.  Previous problems you or members of your family have had with the use of anesthetics.  Any blood disorders you have.  Previous surgeries you have had.  Medical conditions you have. RISKS AND COMPLICATIONS  Generally, this is a safe procedure. However, as with any procedure, complications can occur. Possible complications include:  Bleeding.  Tearing or rupture of the colon wall.  Reaction to medicines given during the exam.  Infection (rare). BEFORE THE PROCEDURE   Ask your health care provider about changing or stopping your regular medicines.  You may be prescribed an oral bowel prep. This involves drinking a large amount of medicated liquid, starting the day before your procedure. The liquid will cause you to have multiple loose stools until your stool is almost clear or light green. This cleans out your colon in preparation for the procedure.  Do not eat or drink anything else once you have started the bowel prep, unless your health care provider tells you it is safe to do so.  Arrange for someone to drive you home after the procedure. PROCEDURE   You will be given medicine to help you relax (sedative).  You will lie on your side with your knees bent.  A long, flexible tube with a light and camera on the end (colonoscope) will be inserted through the rectum and into the colon. The camera sends video back to a computer screen as it moves through the colon. The colonoscope also releases carbon dioxide gas to inflate the colon. This helps your health care provider see the area better.  During  the exam, your health care provider may take a small tissue sample (biopsy) to be examined under a microscope if any abnormalities are found.  The exam is finished when the entire colon has been viewed. AFTER THE PROCEDURE   Do not drive for 24 hours after the exam.  You may have a small amount of blood in your stool.  You may pass moderate amounts of gas and have mild abdominal cramping or bloating. This is caused by the gas used to inflate your colon during the exam.  Ask when your test results will be ready and how you will get your results. Make sure you get your test results. Document Released: 02/29/2000 Document Revised: 12/22/2012 Document Reviewed: 11/08/2012 [facility] Patient Information 2015 [place], [place]. This information is not intended to replace advice given to you by your health care provider. Make sure you discuss any questions you have with your health care provider.  Patient is scheduled for colonoscopy at [facility] for 11/30/13. She is aware to pre register with the hospital at least two days prior. She will only take her Lopressor with a small sip of water at 6 am the day of. Miralax prescription has been sent into her pharmacy. Patient is aware of date and instructions.

## 2013-11-15 NOTE — Progress Notes (Signed)
Patient ID: Shelby Estes, female   DOB: 06/26/1955, 58 y.o.   MRN: [id number]  Chief Complaint  Patient presents with  . Other    screening colonoscopy    HPI Shelby Estes is a 58 y.o. female here today for a evaluation of a colonoscopy. Patient last colonoscopy was in 2007. She states she has no GI problems at this time. Patient has a famly history of colon cancer. Her sister, age 56 died 2 months ago of advanced colon cancer. She had not had any screening exams.    HPI  Past Medical History  Diagnosis Date  . Depression   . Anxiety   . GERD (gastroesophageal reflux disease)   . Vitamin D deficiency   . Elevated BP   . Kidney stone     Dr. Cyndie Mull, April 2014  . Renal cyst     Dr. Cyndie Mull  . SVT (supraventricular tachycardia)     s/p cardioversion  . Diabetes mellitus 2010    Past Surgical History  Procedure Laterality Date  . Arm surgery      due to fracture left elbow  . Cholecystectomy  5/07  . Tubal ligation  02-21-01  . Abdominal hysterectomy  08/28/13  . Colonoscopy  2007    Family History  Problem Relation Age of Onset  . COPD Father   . Stroke Father   . Breast cancer Maternal Aunt   . Seizures Maternal Grandmother   . Breast cancer Other   . Cancer Other     breast  . Diabetes Mother     borderline  . Hypertension Mother   . Thyroid disease Mother   . Colon cancer Sister     23  . Colon cancer Cousin     40's    Social History History  Substance Use Topics  . Smoking status: Never Smoker   . Smokeless tobacco: Never Used  . Alcohol Use: No    Allergies  Allergen Reactions  . Clarithromycin   . Phentermine     SVT  . Ceclor [Cefaclor] Swelling and Rash  . Penicillins Swelling and Rash  . Sulfa Antibiotics Swelling and Rash    Current Outpatient Prescriptions  Medication Sig Dispense Refill  . Cholecalciferol (VITAMIN D) 2000 UNITS CAPS Take 1 capsule by mouth daily.      . citalopram (CELEXA) 20 MG tablet  Take 1 tablet (20 mg total) by mouth daily.  90 tablet  3  . fluticasone (FLONASE) 50 MCG/ACT nasal spray Place 2 sprays into both nostrils daily.  16 g  6  . metoprolol tartrate (LOPRESSOR) 25 MG tablet Take 25 mg by mouth 2 (two) times daily.      . polyethylene glycol powder (GLYCOLAX/MIRALAX) powder Take 255 g by mouth once.  255 g  0   No current facility-administered medications for this visit.    Review of Systems Review of Systems  Constitutional: Negative.   Respiratory: Negative.   Cardiovascular: Negative.     Blood pressure 118/78, pulse 80, resp. rate 16, height 5\' 4"  (1.626 m), weight 287 lb (130.182 kg).  Physical Exam Physical Exam  Constitutional: She is oriented to person, place, and time. She appears well-developed and well-nourished.  Cardiovascular: Normal rate, regular rhythm and normal heart sounds.   Pulmonary/Chest: Effort normal and breath sounds normal.  Abdominal: Soft. Bowel sounds are normal. There is no tenderness.  Neurological: She is alert and oriented to person, place, and time.  Skin: Skin is warm and  dry.    Data Reviewed The June 19, 2005 colonoscopy was normal.  Assessment    First degree relative with colon cancer.     Plan    In light of her sister's recent death early screening is reasonable. The risks associated with colonoscopy were reviewed.     Patient is scheduled for colonoscopy at [facility] for 11/30/13. She is aware to pre register with the hospital at least two days prior. She will only take her Lopressor with a small sip of water at 6 am the day of. Miralax prescription has been sent into her pharmacy. Patient is aware of date and instructions.   PCP/Ref: Ronette Deter     Hervey Ard W 11/16/2013, 9:06 PM

## 2013-11-16 ENCOUNTER — Other Ambulatory Visit: Payer: Self-pay | Admitting: General Surgery

## 2013-11-16 DIAGNOSIS — Z1211 Encounter for screening for malignant neoplasm of colon: Secondary | ICD-10-CM

## 2013-11-29 ENCOUNTER — Telehealth: Payer: Self-pay

## 2013-11-29 NOTE — Telephone Encounter (Signed)
Spoke with patient about colonoscopy scheduled for 11/30/13. I let her know that her insurance would not cover this procedure due to it being only 8 years since her last screening colonoscopy and her sister was diagnosed with colon cancer after the age of 34. The patient has decided to cancel this procedure due to cost. I let her know that I would notify Dr Bary Castilla of this. Colonoscopy procedure has been canceled with Trish in Endo.

## 2013-11-30 ENCOUNTER — Encounter: Payer: Self-pay | Admitting: *Deleted

## 2013-11-30 NOTE — Progress Notes (Signed)
Patient ID: Shelby Estes, female   DOB: 25-May-1955, 58 y.o.   MRN: [id number]  [name] from [facility] was contacted today to try and get contact fax or address for appeal of colonoscopy.   [name] states that patient's colonoscopy that was originally scheduled for 11-30-13 was not denied she was just waiting on a call back to see when patient's sister was diagnosed with cancer so they could further review request for authorization. [name] was informed today that patient's sister was diagnosed with cancer at age 41 and died at age 45.   This information will be sent to the medical director for review. They will contact our office once a decision has been made.

## 2013-12-01 ENCOUNTER — Encounter: Payer: Self-pay | Admitting: *Deleted

## 2013-12-01 NOTE — Progress Notes (Signed)
Patient ID: Shelby Estes, female   DOB: Nov 01, 1955, 58 y.o.   MRN: [id number]  [name] from [facility] called the office today to report that the medical director has denied request for patient's colonoscopy.  Paperwork will be mailed out today that reflects the denial and letter will also have the information so we can file an appeal.

## 2013-12-19 ENCOUNTER — Encounter: Payer: Self-pay | Admitting: *Deleted

## 2013-12-19 NOTE — Progress Notes (Signed)
Patient ID: Shelby Estes, female   DOB: 07-May-1955, 58 y.o.   MRN: [id number]  Customer service through [facility] was contacted today. They have provided the following information so we can start an appeal for patient's colonoscopy that was denied by the medical director. It can be mailed to the address below.   [employer]  Attn: Appeals  [address], [place] [address]  *Please include patient's subscriber number: [phone].

## 2014-03-14 ENCOUNTER — Other Ambulatory Visit: Payer: Self-pay | Admitting: Internal Medicine

## 2014-03-14 NOTE — Telephone Encounter (Signed)
Ok to fill 

## 2014-05-18 ENCOUNTER — Ambulatory Visit (INDEPENDENT_AMBULATORY_CARE_PROVIDER_SITE_OTHER): Payer: BLUE CROSS/BLUE SHIELD | Admitting: Internal Medicine

## 2014-05-18 ENCOUNTER — Encounter: Payer: Self-pay | Admitting: Internal Medicine

## 2014-05-18 VITALS — BP 132/75 | HR 59 | Temp 97.8°F | Ht 63.0 in | Wt 290.1 lb

## 2014-05-18 DIAGNOSIS — F32A Depression, unspecified: Secondary | ICD-10-CM

## 2014-05-18 DIAGNOSIS — G4733 Obstructive sleep apnea (adult) (pediatric): Secondary | ICD-10-CM

## 2014-05-18 DIAGNOSIS — Z9989 Dependence on other enabling machines and devices: Secondary | ICD-10-CM

## 2014-05-18 DIAGNOSIS — E119 Type 2 diabetes mellitus without complications: Secondary | ICD-10-CM

## 2014-05-18 DIAGNOSIS — E1169 Type 2 diabetes mellitus with other specified complication: Secondary | ICD-10-CM

## 2014-05-18 DIAGNOSIS — F329 Major depressive disorder, single episode, unspecified: Secondary | ICD-10-CM

## 2014-05-18 DIAGNOSIS — E669 Obesity, unspecified: Secondary | ICD-10-CM

## 2014-05-18 DIAGNOSIS — Z0000 Encounter for general adult medical examination without abnormal findings: Secondary | ICD-10-CM

## 2014-05-18 LAB — MICROALBUMIN / CREATININE URINE RATIO
CREATININE, U: 143.1 mg/dL
Microalb Creat Ratio: 0.5 mg/g (ref 0.0–30.0)

## 2014-05-18 LAB — HEMOGLOBIN A1C: HEMOGLOBIN A1C: 7 % — AB (ref 4.6–6.5)

## 2014-05-18 LAB — CBC WITH DIFFERENTIAL/PLATELET
Basophils Absolute: 0 10*3/uL (ref 0.0–0.1)
Basophils Relative: 0.4 % (ref 0.0–3.0)
Eosinophils Absolute: 0.2 10*3/uL (ref 0.0–0.7)
Eosinophils Relative: 1.5 % (ref 0.0–5.0)
HCT: 39 % (ref 36.0–46.0)
HEMOGLOBIN: 13.1 g/dL (ref 12.0–15.0)
LYMPHS ABS: 2.5 10*3/uL (ref 0.7–4.0)
Lymphocytes Relative: 23.1 % (ref 12.0–46.0)
MCHC: 33.5 g/dL (ref 30.0–36.0)
MCV: 84.3 fl (ref 78.0–100.0)
Monocytes Absolute: 0.6 10*3/uL (ref 0.1–1.0)
Monocytes Relative: 5.3 % (ref 3.0–12.0)
Neutro Abs: 7.6 10*3/uL (ref 1.4–7.7)
Neutrophils Relative %: 69.7 % (ref 43.0–77.0)
PLATELETS: 463 10*3/uL — AB (ref 150.0–400.0)
RBC: 4.62 Mil/uL (ref 3.87–5.11)
RDW: 14.8 % (ref 11.5–15.5)
WBC: 11 10*3/uL — ABNORMAL HIGH (ref 4.0–10.5)

## 2014-05-18 LAB — COMPREHENSIVE METABOLIC PANEL
ALT: 11 U/L (ref 0–35)
AST: 13 U/L (ref 0–37)
Albumin: 4.1 g/dL (ref 3.5–5.2)
Alkaline Phosphatase: 104 U/L (ref 39–117)
BILIRUBIN TOTAL: 0.5 mg/dL (ref 0.2–1.2)
BUN: 11 mg/dL (ref 6–23)
CHLORIDE: 103 meq/L (ref 96–112)
CO2: 29 mEq/L (ref 19–32)
CREATININE: 0.81 mg/dL (ref 0.40–1.20)
Calcium: 9.1 mg/dL (ref 8.4–10.5)
GFR: 77.13 mL/min (ref >60.00)
Glucose, Bld: 127 mg/dL — ABNORMAL HIGH (ref 70–99)
Potassium: 3.8 mEq/L (ref 3.5–5.1)
Sodium: 137 mEq/L (ref 135–145)
Total Protein: 7.4 g/dL (ref 6.0–8.3)

## 2014-05-18 LAB — LIPID PANEL
CHOL/HDL RATIO: 5
CHOLESTEROL: 174 mg/dL (ref 0–200)
HDL: 38.5 mg/dL — ABNORMAL LOW (ref >39.00)
LDL Cholesterol: 111 mg/dL — ABNORMAL HIGH (ref 0–99)
NONHDL: 135.5
TRIGLYCERIDES: 124 mg/dL (ref 0.0–149.0)
VLDL: 24.8 mg/dL (ref 0.0–40.0)

## 2014-05-18 LAB — VITAMIN D 25 HYDROXY (VIT D DEFICIENCY, FRACTURES): VITD: 15.07 ng/mL — ABNORMAL LOW (ref 30.00–100.00)

## 2014-05-18 LAB — TSH: TSH: 1.45 u[IU]/mL (ref 0.35–4.50)

## 2014-05-18 MED ORDER — BUPROPION HCL ER (XL) 150 MG PO TB24: 150.0000 mg | ORAL_TABLET | Freq: Every day | ORAL | Status: DC

## 2014-05-18 MED ORDER — CITALOPRAM HYDROBROMIDE 20 MG PO TABS: 20.0000 mg | ORAL_TABLET | Freq: Every day | ORAL | Status: DC

## 2014-05-18 MED ORDER — METOPROLOL TARTRATE 25 MG PO TABS: 25.0000 mg | ORAL_TABLET | Freq: Two times a day (BID) | ORAL | Status: DC

## 2014-05-18 MED ORDER — ENALAPRIL MALEATE 2.5 MG PO TABS: 2.5000 mg | ORAL_TABLET | Freq: Every day | ORAL | Status: DC

## 2014-05-18 NOTE — Progress Notes (Signed)
Pre visit review using our clinic review tool, if applicable. No additional management support is needed unless otherwise documented below in the visit note. 

## 2014-05-18 NOTE — Assessment & Plan Note (Signed)
Recently started on CPAP. Will request notes regarding CPAP settings.

## 2014-05-18 NOTE — Assessment & Plan Note (Signed)
General medical exam normal today including breast exam except as noted. PAP and pelvic deferred given PAP normal, HPV neg in 2013 and now s/p TAH. Mammogram ordered. Colonoscopy UTD. Immunizations UTD, except for flu vaccine which she declined. Will check labs today including CBC, CMP, lipids, A1c, TSH, urine microalbumin. Encouraged healthy diet and exercise with goal of weight loss.

## 2014-05-18 NOTE — Assessment & Plan Note (Signed)
Lab Results  Component Value Date   HGBA1C 6.8* 10/26/2013   Will check A1c with labs.

## 2014-05-18 NOTE — Progress Notes (Signed)
Subjective:    Patient ID: Shelby Estes, female    DOB: 1956/01/23, 59 y.o.   MRN: [id number]  HPI  59Y0 female presents for annual exam.  Had TAH in 09/2013. Doing well. No residual concerns, pain.  DM - BG running near 120-140 in the morning and 90-95 in the evening.  Frustrated by weight gain. Would like to lose weight. Would like to consider appetite suppressants. Not following any diet or exercise program. Notes that she feels anxious and depressed at times and overeats to cope. Her husband recently had an MI and her sister died of colon cancer.  Wt Readings from Last 3 Encounters:  05/18/14 290 lb 2 oz (131.6 kg)  11/15/13 287 lb (130.182 kg)  03/01/13 271 lb 4 oz (123.038 kg)   Started on CPAP after sleep study with Dr. Humphrey Rolls showed sleep apnea. Feels better when using CPAP with improved energy.  Past medical, surgical, family and social history per today's encounter.  Review of Systems  Constitutional: Negative for fever, chills, appetite change, fatigue and unexpected weight change.  Eyes: Negative for visual disturbance.  Respiratory: Negative for chest tightness and shortness of breath.   Cardiovascular: Negative for chest pain and leg swelling.  Gastrointestinal: Negative for nausea, vomiting, abdominal pain, diarrhea and constipation.  Musculoskeletal: Negative for myalgias and arthralgias.  Skin: Negative for color change and rash.  Hematological: Negative for adenopathy. Does not bruise/bleed easily.  Psychiatric/Behavioral: Positive for dysphoric mood. Negative for sleep disturbance. The patient is nervous/anxious.        Objective:    BP 132/75 mmHg  Pulse 59  Temp(Src) 97.8 F (36.6 C) (Oral)  Ht 5\' 3"  (1.6 m)  Wt 290 lb 2 oz (131.6 kg)  BMI 51.41 kg/m2  SpO2 97% Physical Exam  Constitutional: She is oriented to person, place, and time. She appears well-developed and well-nourished. No distress.  HENT:  Head: Normocephalic and atraumatic.  Right  Ear: External ear normal.  Left Ear: External ear normal.  Nose: Nose normal.  Mouth/Throat: Oropharynx is clear and moist. No oropharyngeal exudate.  Eyes: Conjunctivae are normal. Pupils are equal, round, and reactive to light. Right eye exhibits no discharge. Left eye exhibits no discharge. No scleral icterus.  Neck: Normal range of motion. Neck supple. No tracheal deviation present. No thyromegaly present.  Cardiovascular: Normal rate, regular rhythm, normal heart sounds and intact distal pulses.  Exam reveals no gallop and no friction rub.   No murmur heard. Pulmonary/Chest: Effort normal and breath sounds normal. No accessory muscle usage. No tachypnea. No respiratory distress. She has no decreased breath sounds. She has no wheezes. She has no rales. She exhibits no tenderness. Right breast exhibits no inverted nipple, no mass, no nipple discharge, no skin change and no tenderness. Left breast exhibits no inverted nipple, no mass, no nipple discharge, no skin change and no tenderness. Breasts are symmetrical.  Abdominal: Soft. Bowel sounds are normal. She exhibits no distension and no mass. There is no tenderness. There is no rebound and no guarding.  Musculoskeletal: Normal range of motion. She exhibits no edema or tenderness.  Lymphadenopathy:    She has no cervical adenopathy.  Neurological: She is alert and oriented to person, place, and time. No cranial nerve deficit. She exhibits normal muscle tone. Coordination normal.  Skin: Skin is warm and dry. No rash noted. She is not diaphoretic. No erythema. No pallor.  Psychiatric: She has a normal mood and affect. Her behavior is normal. Judgment and  thought content normal.          Assessment & Plan:   Problem List Items Addressed This Visit      Unprioritized   Depression    Recent worsening symptoms of dysphoric mood and anxiety, which likely contributed to overeating. Will add Wellbutrin to Celexa. Discussed potential risks of  serotonin syndrome. Follow up in 4 weeks and prn.      Relevant Medications   citalopram (CELEXA) tablet   buPROPion (WELLBUTRIN XL) 24 hr tablet   Diabetes mellitus type 2 in obese    Lab Results  Component Value Date   HGBA1C 6.8* 10/26/2013   Will check A1c with labs.       Relevant Medications   enalapril (VASOTEC) tablet   Other Relevant Orders   Hemoglobin A1c   Obesity, Class III, BMI 40-49.9 (morbid obesity)    Wt Readings from Last 3 Encounters:  05/18/14 290 lb 2 oz (131.6 kg)  11/15/13 287 lb (130.182 kg)  03/01/13 271 lb 4 oz (123.038 kg)   Body mass index is 51.41 kg/(m^2). Encouraged healthy diet and limitation of carbohydrates. Will add Wellbutrin both to help with anxiety/depression and with appetite. Follow up in 4 weeks.      OSA on CPAP    Recently started on CPAP. Will request notes regarding CPAP settings.      Routine general medical examination at a health care facility - Primary    General medical exam normal today including breast exam except as noted. PAP and pelvic deferred given PAP normal, HPV neg in 2013 and now s/p TAH. Mammogram ordered. Colonoscopy UTD. Immunizations UTD, except for flu vaccine which she declined. Will check labs today including CBC, CMP, lipids, A1c, TSH, urine microalbumin. Encouraged healthy diet and exercise with goal of weight loss.        Relevant Orders   CBC with Differential/Platelet   Comprehensive metabolic panel   Lipid panel   Microalbumin / creatinine urine ratio   Vit D  25 hydroxy (rtn osteoporosis monitoring)   TSH   MM Digital Screening       Return in about 4 weeks (around 06/15/2014) for Recheck.

## 2014-05-18 NOTE — Assessment & Plan Note (Signed)
Recent worsening symptoms of dysphoric mood and anxiety, which likely contributed to overeating. Will add Wellbutrin to Celexa. Discussed potential risks of serotonin syndrome. Follow up in 4 weeks and prn.

## 2014-05-18 NOTE — Assessment & Plan Note (Signed)
Wt Readings from Last 3 Encounters:  05/18/14 290 lb 2 oz (131.6 kg)  11/15/13 287 lb (130.182 kg)  03/01/13 271 lb 4 oz (123.038 kg)   Body mass index is 51.41 kg/(m^2). Encouraged healthy diet and limitation of carbohydrates. Will add Wellbutrin both to help with anxiety/depression and with appetite. Follow up in 4 weeks.

## 2014-05-18 NOTE — Patient Instructions (Signed)
Start Wellbutrin $RemoveBeforeDE'150mg'mDvchJisIxDYmoR$  every morning to help with appetite.  Try limiting carbohydrate intake to 35gm with each meal and 15gm with a snack.  Health Maintenance Adopting a healthy lifestyle and getting preventive care can go a long way to promote health and wellness. Talk with your health care provider about what schedule of regular examinations is right for you. This is a good chance for you to check in with your provider about disease prevention and staying healthy. In between checkups, there are plenty of things you can do on your own. Experts have done a lot of research about which lifestyle changes and preventive measures are most likely to keep you healthy. Ask your health care provider for more information. WEIGHT AND DIET  Eat a healthy diet  Be sure to include plenty of vegetables, fruits, low-fat dairy products, and lean protein.  Do not eat a lot of foods high in solid fats, added sugars, or salt.  Get regular exercise. This is one of the most important things you can do for your health.  Most adults should exercise for at least 150 minutes each week. The exercise should increase your heart rate and make you sweat (moderate-intensity exercise).  Most adults should also do strengthening exercises at least twice a week. This is in addition to the moderate-intensity exercise.  Maintain a healthy weight  Body mass index (BMI) is a measurement that can be used to identify possible weight problems. It estimates body fat based on height and weight. Your health care provider can help determine your BMI and help you achieve or maintain a healthy weight.  For females 69 years of age and older:   A BMI below 18.5 is considered underweight.  A BMI of 18.5 to 24.9 is normal.  A BMI of 25 to 29.9 is considered overweight.  A BMI of 30 and above is considered obese.  Watch levels of cholesterol and blood lipids  You should start having your blood tested for lipids and cholesterol at  59 years of age, then have this test every 5 years.  You may need to have your cholesterol levels checked more often if:  Your lipid or cholesterol levels are high.  You are older than 59 years of age.  You are at high risk for heart disease.  CANCER SCREENING   Lung Cancer  Lung cancer screening is recommended for adults 39-41 years old who are at high risk for lung cancer because of a history of smoking.  A yearly low-dose CT scan of the lungs is recommended for people who:  Currently smoke.  Have quit within the past 15 years.  Have at least a 30-pack-year history of smoking. A pack year is smoking an average of one pack of cigarettes a day for 1 year.  Yearly screening should continue until it has been 15 years since you quit.  Yearly screening should stop if you develop a health problem that would prevent you from having lung cancer treatment.  Breast Cancer  Practice breast self-awareness. This means understanding how your breasts normally appear and feel.  It also means doing regular breast self-exams. Let your health care provider know about any changes, no matter how small.  If you are in your 20s or 30s, you should have a clinical breast exam (CBE) by a health care provider every 1-3 years as part of a regular health exam.  If you are 76 or older, have a CBE every year. Also consider having a breast X-ray (mammogram)  every year.  If you have a family history of breast cancer, talk to your health care provider about genetic screening.  If you are at high risk for breast cancer, talk to your health care provider about having an MRI and a mammogram every year.  Breast cancer gene (BRCA) assessment is recommended for women who have family members with BRCA-related cancers. BRCA-related cancers include:  Breast.  Ovarian.  Tubal.  Peritoneal cancers.  Results of the assessment will determine the need for genetic counseling and BRCA1 and BRCA2  testing. Cervical Cancer Routine pelvic examinations to screen for cervical cancer are no longer recommended for nonpregnant women who are considered low risk for cancer of the pelvic organs (ovaries, uterus, and vagina) and who do not have symptoms. A pelvic examination may be necessary if you have symptoms including those associated with pelvic infections. Ask your health care provider if a screening pelvic exam is right for you.   The Pap test is the screening test for cervical cancer for women who are considered at risk.  If you had a hysterectomy for a problem that was not cancer or a condition that could lead to cancer, then you no longer need Pap tests.  If you are older than 65 years, and you have had normal Pap tests for the past 10 years, you no longer need to have Pap tests.  If you have had past treatment for cervical cancer or a condition that could lead to cancer, you need Pap tests and screening for cancer for at least 20 years after your treatment.  If you no longer get a Pap test, assess your risk factors if they change (such as having a new sexual partner). This can affect whether you should start being screened again.  Some women have medical problems that increase their chance of getting cervical cancer. If this is the case for you, your health care provider may recommend more frequent screening and Pap tests.  The human papillomavirus (HPV) test is another test that may be used for cervical cancer screening. The HPV test looks for the virus that can cause cell changes in the cervix. The cells collected during the Pap test can be tested for HPV.  The HPV test can be used to screen women 15 years of age and older. Getting tested for HPV can extend the interval between normal Pap tests from three to five years.  An HPV test also should be used to screen women of any age who have unclear Pap test results.  After 59 years of age, women should have HPV testing as often as Pap  tests.  Colorectal Cancer  This type of cancer can be detected and often prevented.  Routine colorectal cancer screening usually begins at 59 years of age and continues through 58 years of age.  Your health care provider may recommend screening at an earlier age if you have risk factors for colon cancer.  Your health care provider may also recommend using home test kits to check for hidden blood in the stool.  A small camera at the end of a tube can be used to examine your colon directly (sigmoidoscopy or colonoscopy). This is done to check for the earliest forms of colorectal cancer.  Routine screening usually begins at age 58.  Direct examination of the colon should be repeated every 5-10 years through 59 years of age. However, you may need to be screened more often if early forms of precancerous polyps or small growths are  found. Skin Cancer  Check your skin from head to toe regularly.  Tell your health care provider about any new moles or changes in moles, especially if there is a change in a mole's shape or color.  Also tell your health care provider if you have a mole that is larger than the size of a pencil eraser.  Always use sunscreen. Apply sunscreen liberally and repeatedly throughout the day.  Protect yourself by wearing long sleeves, pants, a wide-brimmed hat, and sunglasses whenever you are outside. HEART DISEASE, DIABETES, AND HIGH BLOOD PRESSURE   Have your blood pressure checked at least every 1-2 years. High blood pressure causes heart disease and increases the risk of stroke.  If you are between 110 years and 37 years old, ask your health care provider if you should take aspirin to prevent strokes.  Have regular diabetes screenings. This involves taking a blood sample to check your fasting blood sugar level.  If you are at a normal weight and have a low risk for diabetes, have this test once every three years after 59 years of age.  If you are overweight and  have a high risk for diabetes, consider being tested at a younger age or more often. PREVENTING INFECTION  Hepatitis B  If you have a higher risk for hepatitis B, you should be screened for this virus. You are considered at high risk for hepatitis B if:  You were born in a country where hepatitis B is common. Ask your health care provider which countries are considered high risk.  Your parents were born in a high-risk country, and you have not been immunized against hepatitis B (hepatitis B vaccine).  You have HIV or AIDS.  You use needles to inject street drugs.  You live with someone who has hepatitis B.  You have had sex with someone who has hepatitis B.  You get hemodialysis treatment.  You take certain medicines for conditions, including cancer, organ transplantation, and autoimmune conditions. Hepatitis C  Blood testing is recommended for:  Everyone born from [birth year] through 1965.  Anyone with known risk factors for hepatitis C. Sexually transmitted infections (STIs)  You should be screened for sexually transmitted infections (STIs) including gonorrhea and chlamydia if:  You are sexually active and are younger than 59 years of age.  You are older than 59 years of age and your health care provider tells you that you are at risk for this type of infection.  Your sexual activity has changed since you were last screened and you are at an increased risk for chlamydia or gonorrhea. Ask your health care provider if you are at risk.  If you do not have HIV, but are at risk, it may be recommended that you take a prescription medicine daily to prevent HIV infection. This is called pre-exposure prophylaxis (PrEP). You are considered at risk if:  You are sexually active and do not regularly use condoms or know the HIV status of your partner(s).  You take drugs by injection.  You are sexually active with a partner who has HIV. Talk with your health care provider about whether you  are at high risk of being infected with HIV. If you choose to begin PrEP, you should first be tested for HIV. You should then be tested every 3 months for as long as you are taking PrEP.  PREGNANCY   If you are premenopausal and you may become pregnant, ask your health care provider about preconception counseling.  If  you may become pregnant, take 400 to 800 micrograms (mcg) of folic acid every day.  If you want to prevent pregnancy, talk to your health care provider about birth control (contraception). OSTEOPOROSIS AND MENOPAUSE   Osteoporosis is a disease in which the bones lose minerals and strength with aging. This can result in serious bone fractures. Your risk for osteoporosis can be identified using a bone density scan.  If you are 73 years of age or older, or if you are at risk for osteoporosis and fractures, ask your health care provider if you should be screened.  Ask your health care provider whether you should take a calcium or vitamin D supplement to lower your risk for osteoporosis.  Menopause may have certain physical symptoms and risks.  Hormone replacement therapy may reduce some of these symptoms and risks. Talk to your health care provider about whether hormone replacement therapy is right for you.  HOME CARE INSTRUCTIONS   Schedule regular health, dental, and eye exams.  Stay current with your immunizations.   Do not use any tobacco products including cigarettes, chewing tobacco, or electronic cigarettes.  If you are pregnant, do not drink alcohol.  If you are breastfeeding, limit how much and how often you drink alcohol.  Limit alcohol intake to no more than 1 drink per day for nonpregnant women. One drink equals 12 ounces of beer, 5 ounces of wine, or 1 ounces of hard liquor.  Do not use street drugs.  Do not share needles.  Ask your health care provider for help if you need support or information about quitting drugs.  Tell your health care provider if  you often feel depressed.  Tell your health care provider if you have ever been abused or do not feel safe at home. Document Released: 09/16/2010 Document Revised: 07/18/2013 Document Reviewed: 02/02/2013 [facility] Patient Information 2015 [place], [place]. This information is not intended to replace advice given to you by your health care provider. Make sure you discuss any questions you have with your health care provider.

## 2014-05-18 NOTE — Assessment & Plan Note (Signed)
>>  ASSESSMENT AND PLAN FOR ANXIETY AND DEPRESSION WRITTEN ON 05/18/2014 10:52 AM BY WALKER, JENNIFER A, MD  Recent worsening symptoms of dysphoric mood and anxiety, which likely contributed to overeating. Will add Wellbutrin  to Celexa . Discussed potential risks of serotonin syndrome. Follow up in 4 weeks and prn.

## 2014-06-22 ENCOUNTER — Ambulatory Visit (INDEPENDENT_AMBULATORY_CARE_PROVIDER_SITE_OTHER): Payer: BLUE CROSS/BLUE SHIELD | Admitting: Internal Medicine

## 2014-06-22 ENCOUNTER — Encounter: Payer: Self-pay | Admitting: Internal Medicine

## 2014-06-22 VITALS — BP 116/80 | HR 64 | Temp 98.3°F | Ht 63.0 in | Wt 285.1 lb

## 2014-06-22 DIAGNOSIS — F329 Major depressive disorder, single episode, unspecified: Secondary | ICD-10-CM | POA: Diagnosis not present

## 2014-06-22 DIAGNOSIS — F32A Depression, unspecified: Secondary | ICD-10-CM

## 2014-06-22 LAB — HM MAMMOGRAPHY

## 2014-06-22 NOTE — Patient Instructions (Signed)
Follow up in 3 months

## 2014-06-22 NOTE — Progress Notes (Signed)
Pre visit review using our clinic review tool, if applicable. No additional management support is needed unless otherwise documented below in the visit note. 

## 2014-06-22 NOTE — Assessment & Plan Note (Signed)
Some persistent symptoms of depression. Discussed options of counseling and adding medication to Celexa. She would prefer to hold off for now. Will follow up in 3 months or sooner as needed.

## 2014-06-22 NOTE — Assessment & Plan Note (Signed)
>>  ASSESSMENT AND PLAN FOR ANXIETY AND DEPRESSION WRITTEN ON 06/22/2014 11:33 AM BY VANNIE, JENNIFER A, MD  Some persistent symptoms of depression. Discussed options of counseling and adding medication to Celexa . She would prefer to hold off for now. Will follow up in 3 months or sooner as needed.

## 2014-06-22 NOTE — Progress Notes (Signed)
Subjective:    Patient ID: Shelby Estes, female    DOB: [birth year], 59 y.o.   MRN: [id number]  HPI 59YO female presents for follow up.  Last seen 3/3 for physical exam. Started on Wellbutrin to help with appetite and anxiety/depression.  Stopped wellbutrin about 1 week ago because of muscle pain. Trying to follow healthier diet. Staying more active.  Symptoms of depression have improved some. Every day has some periods of sadness. Tearful describing this. Struggling with sister's death.  Wt Readings from Last 3 Encounters:  06/22/14 285 lb 2 oz (129.332 kg)  05/18/14 290 lb 2 oz (131.6 kg)  11/15/13 287 lb (130.182 kg)    Past medical, surgical, family and social history per today's encounter.  Review of Systems  Constitutional: Negative for fever, chills, appetite change, fatigue and unexpected weight change.  Eyes: Negative for visual disturbance.  Respiratory: Negative for shortness of breath.   Cardiovascular: Negative for chest pain and leg swelling.  Gastrointestinal: Negative for abdominal pain.  Musculoskeletal: Negative for myalgias and arthralgias.  Skin: Negative for color change and rash.  Hematological: Negative for adenopathy. Does not bruise/bleed easily.  Psychiatric/Behavioral: Positive for sleep disturbance and dysphoric mood. The patient is not nervous/anxious.        Objective:    BP 116/80 mmHg  Pulse 64  Temp(Src) 98.3 F (36.8 C) (Oral)  Ht 5\' 3"  (1.6 m)  Wt 285 lb 2 oz (129.332 kg)  BMI 50.52 kg/m2  SpO2 96% Physical Exam  Constitutional: She is oriented to person, place, and time. She appears well-developed and well-nourished. No distress.  HENT:  Head: Normocephalic and atraumatic.  Right Ear: External ear normal.  Left Ear: External ear normal.  Nose: Nose normal.  Mouth/Throat: Oropharynx is clear and moist. No oropharyngeal exudate.  Eyes: Conjunctivae are normal. Pupils are equal, round, and reactive to light. Right eye  exhibits no discharge. Left eye exhibits no discharge. No scleral icterus.  Neck: Normal range of motion. Neck supple. No tracheal deviation present. No thyromegaly present.  Cardiovascular: Normal rate, regular rhythm, normal heart sounds and intact distal pulses.  Exam reveals no gallop and no friction rub.   No murmur heard. Pulmonary/Chest: Effort normal and breath sounds normal. No respiratory distress. She has no wheezes. She has no rales. She exhibits no tenderness.  Musculoskeletal: Normal range of motion. She exhibits no edema or tenderness.  Lymphadenopathy:    She has no cervical adenopathy.  Neurological: She is alert and oriented to person, place, and time. No cranial nerve deficit. She exhibits normal muscle tone. Coordination normal.  Skin: Skin is warm and dry. No rash noted. She is not diaphoretic. No erythema. No pallor.  Psychiatric: Her speech is normal and behavior is normal. Judgment and thought content normal. Her mood appears not anxious. She exhibits a depressed mood. She expresses no suicidal ideation.          Assessment & Plan:   Problem List Items Addressed This Visit      Unprioritized   Depression - Primary    Some persistent symptoms of depression. Discussed options of counseling and adding medication to Celexa. She would prefer to hold off for now. Will follow up in 3 months or sooner as needed.      Obesity, Class III, BMI 40-49.9 (morbid obesity)    Wt Readings from Last 3 Encounters:  06/22/14 285 lb 2 oz (129.332 kg)  05/18/14 290 lb 2 oz (131.6 kg)  11/15/13 287 lb (  130.182 kg)   Body mass index is 50.52 kg/(m^2). Congratulated Shelby Estes on weight loss. Encouraged continued healthy diet and exercise.          Return in about 2 months (around 08/22/2014) for Recheck of Diabetes.

## 2014-06-22 NOTE — Assessment & Plan Note (Signed)
Wt Readings from Last 3 Encounters:  06/22/14 285 lb 2 oz (129.332 kg)  05/18/14 290 lb 2 oz (131.6 kg)  11/15/13 287 lb (130.182 kg)   Body mass index is 50.52 kg/(m^2). Congratulated pt on weight loss. Encouraged continued healthy diet and exercise.

## 2014-07-08 NOTE — Op Note (Signed)
PATIENT NAME:  Shelby Estes, Shelby Estes MR#:  [id number] DATE OF BIRTH:  02-21-1956  DATE OF PROCEDURE:  09/27/2013  PREOPERATIVE DIAGNOSIS: Complex endometrial hyperplasia with atypia.   POSTOPERATIVE DIAGNOSIS: Complex endometrial hyperplasia with atypia.   PROCEDURE PERFORMED: Laparoscopy with robotic assistance for total abdominal hysterectomy with bilateral salpingo-oophorectomy.   SURGEON: Jacquelyne Balint, M.D.   ANESTHESIA: General.   COMPLICATIONS: None.   ESTIMATED BLOOD LOSS: 25 mL.   INDICATION FOR SURGERY: Shelby Estes is a 59 year old morbidly obese patient, who complained of postmenopausal bleeding, and on endometrial biopsy, was noted to have complex hypoplasia with atypia. Therefore, the decision was made to proceed with surgery.   FINDINGS AT THE TIME OF SURGERY: Uterus of normal form and size. Adnexa normal. No peritoneal lesion. Procedure complicated by morbid truncal obesity.   FROZEN SECTION: No evidence of invasive malignancy.   OPERATIVE REPORT: After adequate general anesthesia had been obtained, the patient was prepped and draped in ski position. Examination under anesthesia was done. A Foley catheter was inserted. The cervix was visualized, grasped with a single-tooth tenaculum and a V-Care was inserted into the uterus and around the cervix.   Attention was then directed towards the abdomen. A 1-cm incision was placed above the umbilicus. The fascia was grasped and transected. The peritoneum was identified and entered. The blunt trocar was inserted, followed by the camera. After pneumoperitoneum had been obtained, inspection was done with the above-mentioned findings. Under direct vision, 2 robotic trocars were inserted into the mid abdomen, a 4th one into the left lower quadrant, and a 12-mm VersaStep into the right lower quadrant.   The patient was placed in steep Trendelenburg position. Then the bowel was pushed away from the pelvis. The patient was attached to the  robot.   Pelvic cytology was obtained. Then the round ligament on the left side was cauterized and transected. The pelvic sidewall was entered. Vessels and ureter were identified. The adhesions to the sigmoid colon were freed. Then the infundibulopelvic ligament was cauterized and transected. The adnexa were mobilized towards the uterus. The same procedure was performed on the contralateral side. Then the anterior fold of the peritoneum was incised and the bladder was freed from lower uterine segment, cervix, and upper vagina. Then the posterior peritoneum was incised. The uterine vessels on either side were cauterized and transected. Then the vagina was entered anteriorly and incision was carried all the way around until the uterus and adnexa were completely freed and could be removed through the vagina. The vaginal cuff was closed with a running V-Loc suture.   Frozen section failed to reveal any evidence of invasion; therefore, no further procedures were deemed necessary. Careful inspection revealed adequate hemostasis. The patient was detached from the robot. All trocars were removed under direct vision without evidence of bleeding. The camera port was closed with 2 figure-of-8 stitches using 0 Vicryl. Then the subcutaneous tissue of all port sites was reapproximated with 3-0 Vicryl and Dermabond was used to close the skin; 15 mL of plain lidocaine was injected into the port sites.   Vaginal exam revealed a small injury at the hymenal ring; however, after a figure-of-8 stitch with 2-0 Vicryl was placed, hemostasis was adequate.   The patient tolerated the procedure well and was taken to the recovery room in stable condition. Pads, sponge, needle, and instrument counts were correct x 2. The postoperative urine was clear.   ____________________________  Weber Cooks, MD bem:jr D: 09/27/2013 18:20:56 ET T: 09/27/2013 18:55:52 ET  JOB#: [id number]  cc: Weber Cooks, MD, <Dictator> Weber Cooks MD ELECTRONICALLY SIGNED 10/04/2013 14:44

## 2014-07-08 NOTE — Op Note (Signed)
PATIENT NAME:  Shelby Estes, Shelby Estes MR#:  [id number] DATE OF BIRTH:  November 30, 1955  DATE OF PROCEDURE:  08/15/2013  PREOPERATIVE DIAGNOSES:  1.  Postmenopausal bleeding.  2.  Cervical stenosis.  3.  Thickened endometrium on ultrasound.  POSTOPERATIVE DIAGNOSES: 1.  Postmenopausal bleeding.  2.  Cervical stenosis.  3.  Thickened endometrium on ultrasound. 4.  Suspected endometrial polyp.   OPERATIVE PROCEDURES:  1.  Hysteroscopy.  2.  Dilation and curettage of the endometrium.   SURGEON: Brayton Mars, M.D.   FIRST ASSISTANT: Mechele Claude , PA-S.   ANESTHESIA: General.   INDICATIONS: The patient is a 59 year old white female, para 0 with episode of postmenopausal bleeding. The patient had thickened endometrium on preoperative ultrasound. Endometrial biopsy in the office could not be accomplished due to cervical stenosis and patient intolerance.   FINDINGS AT SURGERY: Revealed a patient with a narrowed introitus and vagina. The uterus was not palpable due to body habitus. The cervix was significantly stenotic. Lacrimal duct probes were needed to dilate the endocervical canal. The uterus sounded to approximately 6 cm. Small endometrial polyp on hysteroscopy was identified.   DESCRIPTION OF PROCEDURE: The patient was brought to the operating room where she was placed in the supine position. General anesthesia was induced without difficulty. She was placed in dorsal lithotomy position using the candycane stirrups. A Betadine perineal, intravaginal prep and drape was performed in standard fashion. Red Robinson catheter was used to drain 75 mL of urine from the bladder. A weighted speculum was placed into the vagina, and a single-tooth tenaculum was placed on the anterior lip of the cervix. The cervical os was pinpoint and normal sounding could not be accomplished. Lacrimal duct probes had to be utilized with sequential expanders to dilate the endocervical canal. Once this was accomplished,  regular Hanks dilators were used up to an 18-French caliber in order to dilate the endocervical canal. Hysteroscopy was then performed with lactated Ringer's being used as irrigant. There was identified a small endometrial polyp within the endometrial cavity. No other significant lesions were seen. Subsequently, both smooth and serrated curettes were used to curettage the endometrial cavity. Stone polyp forceps were used to extract tissue. Repeat hysteroscopy demonstrated excellent sampling of the uterine cavity. The procedure was then terminated with all instrumentation being removed from the vagina. The patient was then awakened, mobilized, and taken to the recovery room in satisfactory condition.   ESTIMATED BLOOD LOSS: Minimal.   COMPLICATIONS: None.  COUNTS: All instrument, needle and sponge counts were verified as correct.   ____________________________ Alanda Slim. DeFrancesco, MD mad:aw D: 08/15/2013 08:44:31 ET T: 08/15/2013 09:03:26 ET JOB#: [id number]  cc: Hassell Done A. DeFrancesco, MD, <Dictator> [facility] MD ELECTRONICALLY SIGNED 08/15/2013 15:01

## 2014-08-28 ENCOUNTER — Ambulatory Visit: Payer: BLUE CROSS/BLUE SHIELD | Admitting: Internal Medicine

## 2014-10-20 ENCOUNTER — Other Ambulatory Visit: Payer: Self-pay | Admitting: Internal Medicine

## 2014-11-22 ENCOUNTER — Other Ambulatory Visit: Payer: Self-pay | Admitting: Internal Medicine

## 2014-11-23 NOTE — Telephone Encounter (Signed)
Last OV 4.7.16.  Please advise refill 

## 2014-12-13 IMAGING — CT CT ABD-PELV W/O CM
1 of 2 series · 15 of 32 positions shown, 19 images · non-contrast
Comparison: none

REASON FOR EXAM: (1) Left flank pain; (2) left flank pain
COMMENTS:

PROCEDURE:     CT  - CT ABDOMEN AND PELVIS W[DATE]  [DATE]
RESULT:     Comparison: 04/14/2011
TECHNIQUE: Multiple axial images from the lung bases to the symphysis pubis
were obtained with oral and without intravenous contrast.

[Series 2: 3mm soft tissue · axial · 0.79mm/px · z∈[-698,-260]mm · 15 of 160 slices shown, 19 images]
[im 7/160  soft-tissue]
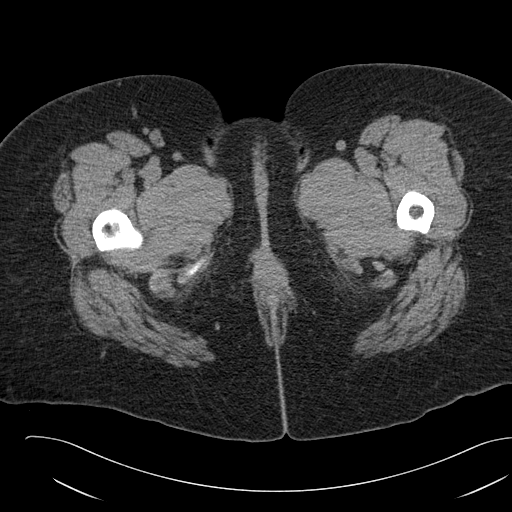
[im 7/160  bone]
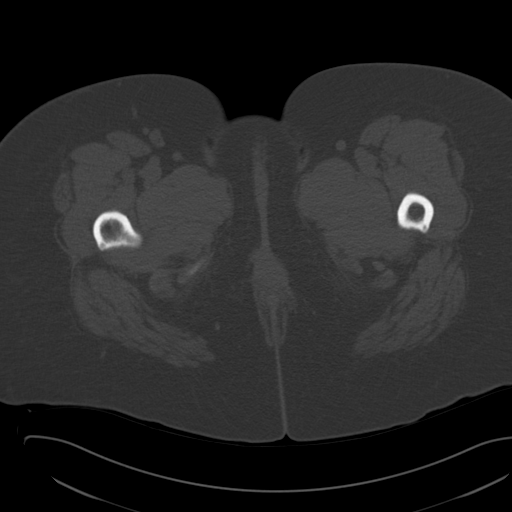
[im 20/160  soft-tissue]
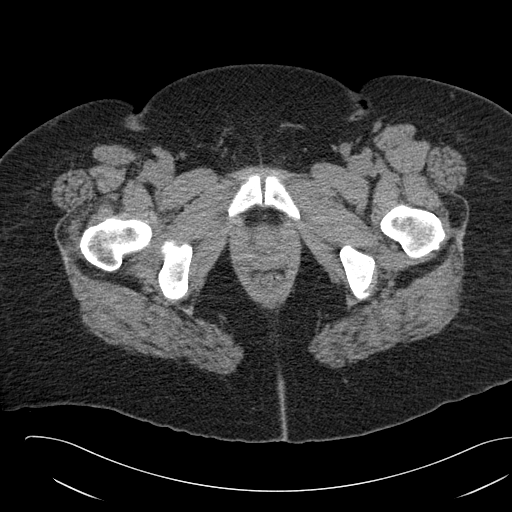
[im 34/160  soft-tissue]
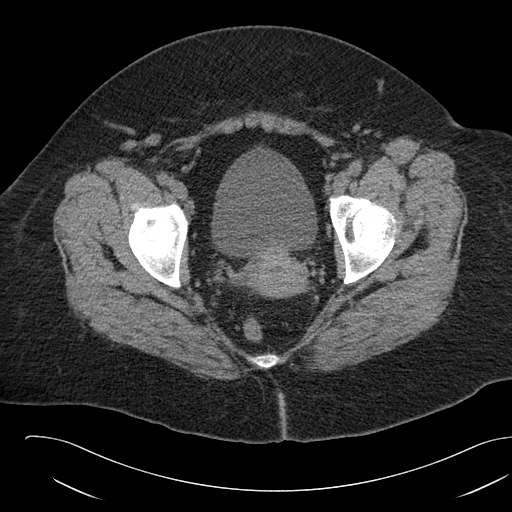
[im 47/160  soft-tissue]
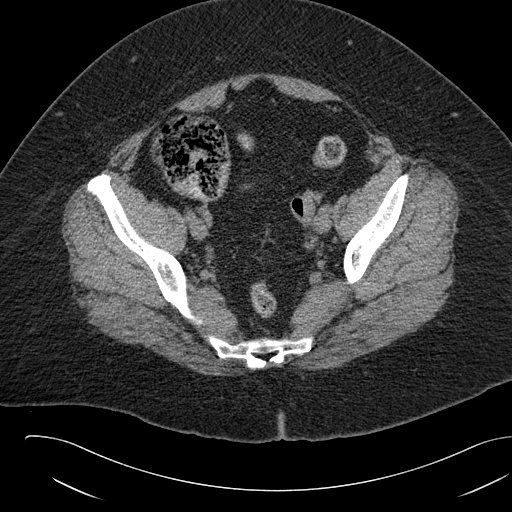
[im 54/160  soft-tissue]
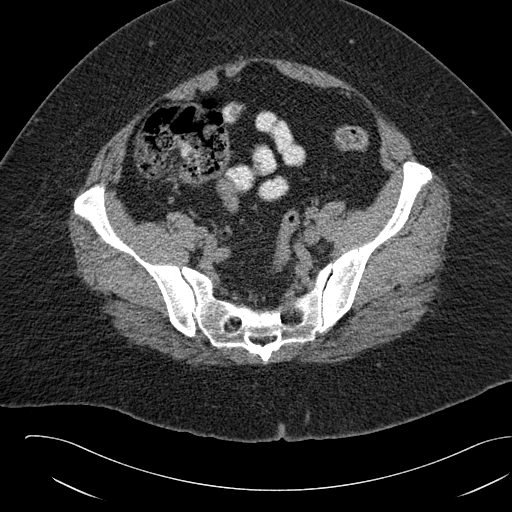
[im 67/160  soft-tissue]
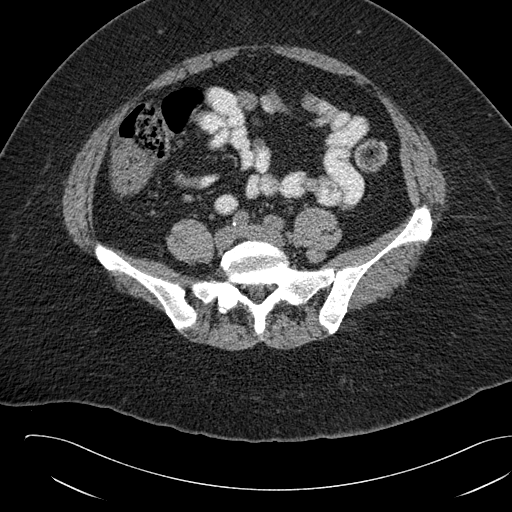
[im 80/160  soft-tissue]
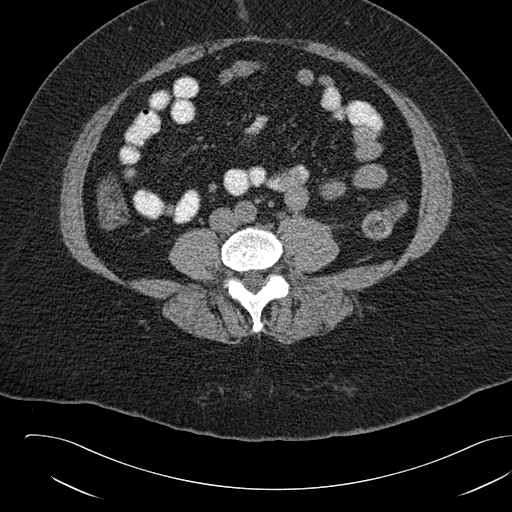
[im 93/160  soft-tissue]
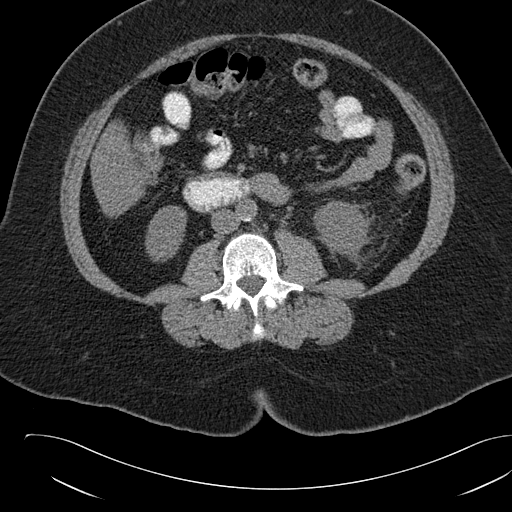
[im 107/160  soft-tissue]
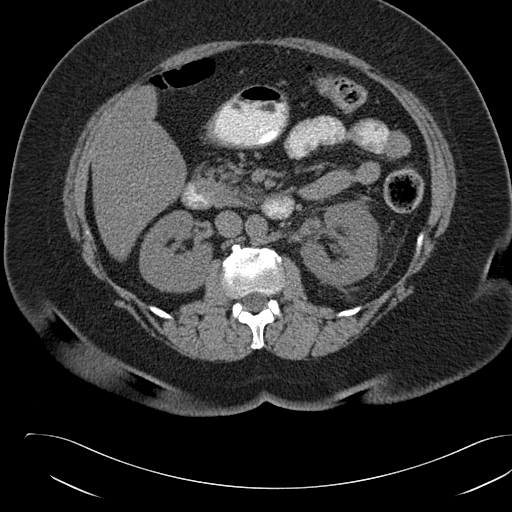
[im 107/160  bone]
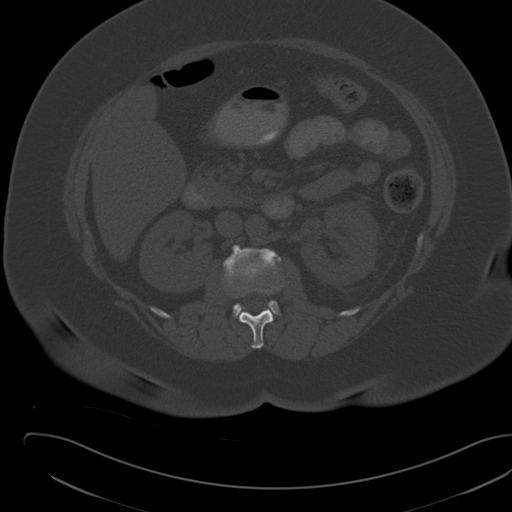
[im 113/160  soft-tissue]
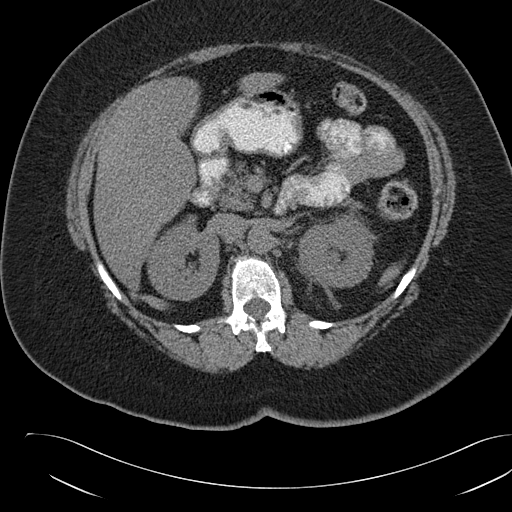
[im 126/160  soft-tissue]
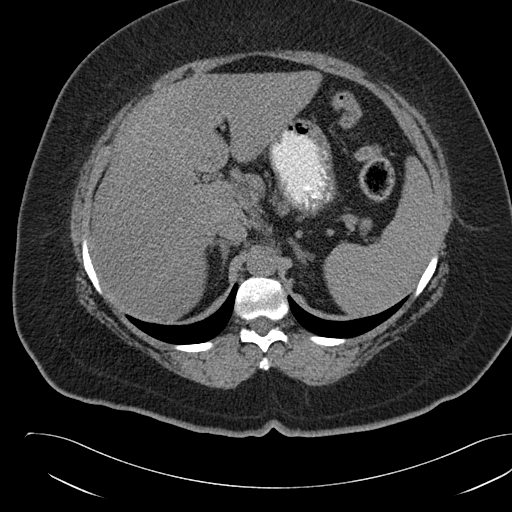
[im 133/160  lung]
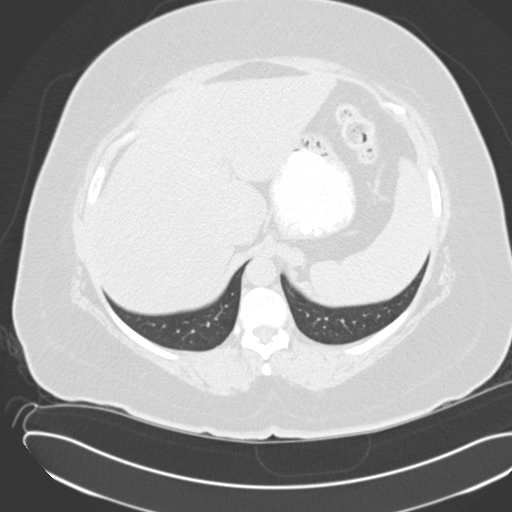
[im 140/160  soft-tissue]
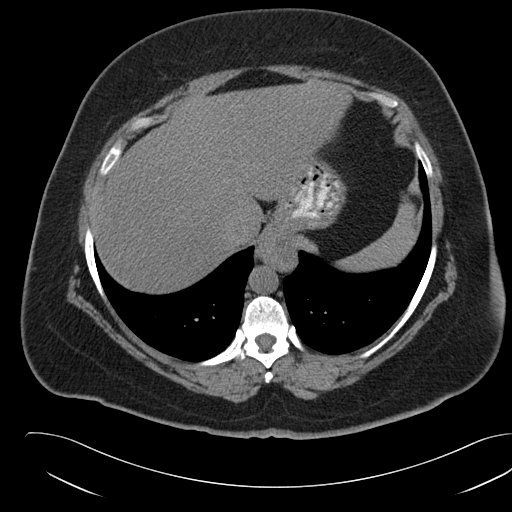
[im 140/160  lung]
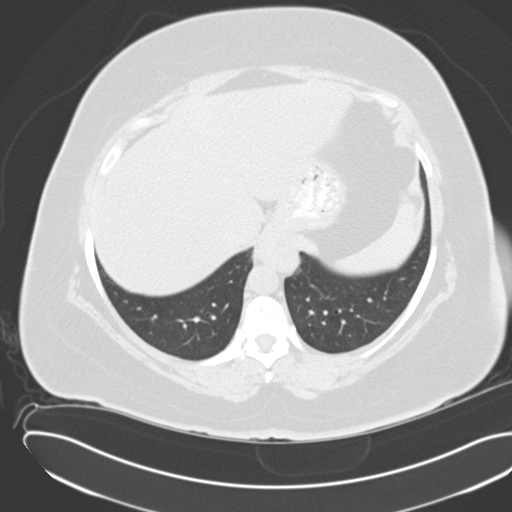
[im 146/160  lung]
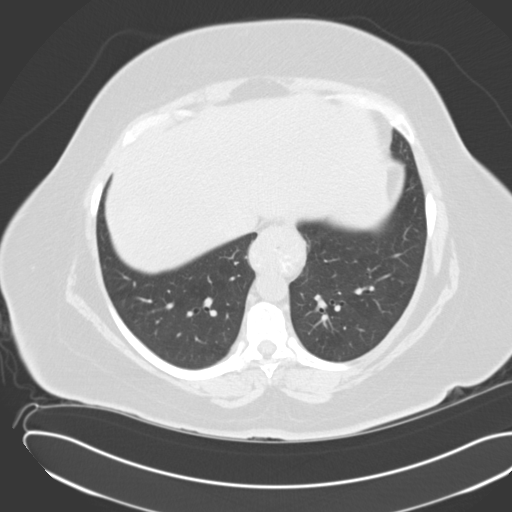
[im 153/160  soft-tissue]
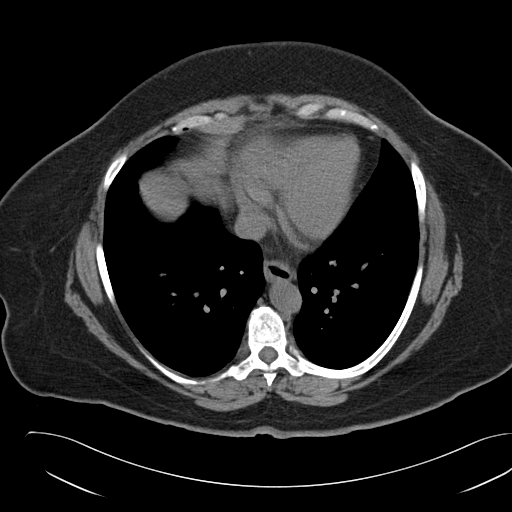
[im 153/160  lung]
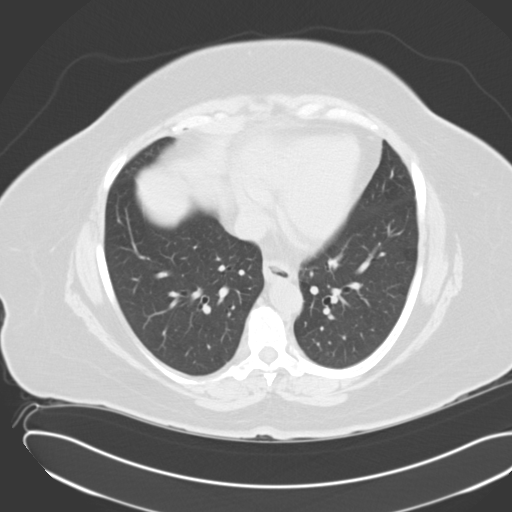

[15 of 32 positions shown; findings below may reference images not displayed]

FINDINGS: Lack of intravenous contrast limits evaluation of the solid abdominal
organs.  The liver is diffusely low in attenuation, consistent with hepatic
steatosis. There is a small round area of increased attenuation in the dome
of the right hepatic lobe. It is indeterminate, but similar to at least
12/24/2009. There is a small to moderate sized hiatal hernia. Surgical clips
are seen from prior cholecystectomy. The spleen, adrenals, and pancreas are
unremarkable. There is mild left hydronephrosis and perinephric stranding.
There is a 5 mm calculus in the proximal left ureter.

The small and large bowel are normal in caliber. There is a small tubular
structure at the base of the cecum which is felt to represent a normal
appendix.

No aggressive lytic or sclerotic osseous lesions are identified.
IMPRESSION: 5 mm calculus in the proximal left ureter causing mild hydronephrosis.

## 2015-01-03 ENCOUNTER — Encounter: Payer: Self-pay | Admitting: Internal Medicine

## 2015-01-08 ENCOUNTER — Encounter: Payer: Self-pay | Admitting: Internal Medicine

## 2015-01-08 ENCOUNTER — Ambulatory Visit (INDEPENDENT_AMBULATORY_CARE_PROVIDER_SITE_OTHER): Payer: BLUE CROSS/BLUE SHIELD | Admitting: Internal Medicine

## 2015-01-08 VITALS — BP 130/85 | HR 55 | Temp 97.8°F | Ht 63.0 in | Wt 290.4 lb

## 2015-01-08 DIAGNOSIS — E669 Obesity, unspecified: Secondary | ICD-10-CM

## 2015-01-08 DIAGNOSIS — E119 Type 2 diabetes mellitus without complications: Secondary | ICD-10-CM

## 2015-01-08 DIAGNOSIS — F329 Major depressive disorder, single episode, unspecified: Secondary | ICD-10-CM | POA: Diagnosis not present

## 2015-01-08 DIAGNOSIS — E1169 Type 2 diabetes mellitus with other specified complication: Secondary | ICD-10-CM

## 2015-01-08 DIAGNOSIS — F32A Depression, unspecified: Secondary | ICD-10-CM

## 2015-01-08 LAB — COMPREHENSIVE METABOLIC PANEL
ALK PHOS: 93 U/L (ref 39–117)
ALT: 13 U/L (ref 0–35)
AST: 15 U/L (ref 0–37)
Albumin: 4.1 g/dL (ref 3.5–5.2)
BUN: 15 mg/dL (ref 6–23)
CHLORIDE: 99 meq/L (ref 96–112)
CO2: 28 meq/L (ref 19–32)
Calcium: 9.7 mg/dL (ref 8.4–10.5)
Creatinine, Ser: 0.88 mg/dL (ref 0.40–1.20)
GFR: 69.94 mL/min (ref >60.00)
GLUCOSE: 126 mg/dL — AB (ref 70–99)
POTASSIUM: 4.1 meq/L (ref 3.5–5.1)
SODIUM: 137 meq/L (ref 135–145)
Total Bilirubin: 0.5 mg/dL (ref 0.2–1.2)
Total Protein: 7.4 g/dL (ref 6.0–8.3)

## 2015-01-08 LAB — LIPID PANEL
CHOL/HDL RATIO: 5
Cholesterol: 167 mg/dL (ref 0–200)
HDL: 36.1 mg/dL — AB (ref >39.00)
LDL CALC: 104 mg/dL — AB (ref 0–99)
NonHDL: 130.69
TRIGLYCERIDES: 133 mg/dL (ref 0.0–149.0)
VLDL: 26.6 mg/dL (ref 0.0–40.0)

## 2015-01-08 LAB — HEMOGLOBIN A1C: Hgb A1c MFr Bld: 6.8 % — ABNORMAL HIGH (ref 4.6–6.5)

## 2015-01-08 LAB — MICROALBUMIN / CREATININE URINE RATIO
Creatinine,U: 34.6 mg/dL
MICROALB/CREAT RATIO: 2 mg/g (ref 0.0–30.0)
Microalb, Ur: <0.7 mg/dL (ref 0.0–1.9)

## 2015-01-08 MED ORDER — CITALOPRAM HYDROBROMIDE 20 MG PO TABS: 20.0000 mg | ORAL_TABLET | Freq: Every day | ORAL | Status: DC

## 2015-01-08 NOTE — Assessment & Plan Note (Signed)
Wt Readings from Last 3 Encounters:  01/08/15 290 lb 6 oz (131.713 kg)  06/22/14 285 lb 2 oz (129.332 kg)  05/18/14 290 lb 2 oz (131.6 kg)   Encouraged healthy diet and exercise. Will consider starting Victoza/Saxenda to help with appetite. Awaiting lab results.

## 2015-01-08 NOTE — Progress Notes (Signed)
Pre visit review using our clinic review tool, if applicable. No additional management support is needed unless otherwise documented below in the visit note. 

## 2015-01-08 NOTE — Assessment & Plan Note (Signed)
>>  ASSESSMENT AND PLAN FOR ANXIETY AND DEPRESSION WRITTEN ON 01/08/2015  8:04 AM BY VANNIE, JENNIFER A, MD  Symptoms well controlled with Citalopram . Will continue.

## 2015-01-08 NOTE — Assessment & Plan Note (Signed)
Symptoms well controlled with Citalopram. Will continue.

## 2015-01-08 NOTE — Progress Notes (Signed)
Subjective:    Patient ID: Shelby Estes, female    DOB: 11-02-1955, 59 y.o.   MRN: [id number]  HPI  59YO female presents for follow up.  Developed swelling with Enalapril last week. Cardiologist stopped medication and changed to Losartan. Weight was up to 300lbs. Has lost over 10lbs after stopping medication.  DM - BG higher in mornings near 150-170. Afternoon 90-120. Sugars have been diet controlled.  Depression - Symptoms well controlled on Citalopram.  Wt Readings from Last 3 Encounters:  01/08/15 290 lb 6 oz (131.713 kg)  06/22/14 285 lb 2 oz (129.332 kg)  05/18/14 290 lb 2 oz (131.6 kg)   BP Readings from Last 3 Encounters:  01/08/15 130/85  06/22/14 116/80  05/18/14 132/75    Past Medical History  Diagnosis Date  . Depression   . Anxiety   . GERD (gastroesophageal reflux disease)   . Vitamin D deficiency   . Elevated BP   . Kidney stone     Dr. Cyndie Mull, April 2014  . Renal cyst     Dr. Cyndie Mull  . SVT (supraventricular tachycardia) [facility])     s/p cardioversion  . Diabetes mellitus 2010   Family History  Problem Relation Age of Onset  . COPD Father   . Stroke Father   . Breast cancer Maternal Aunt   . Seizures Maternal Grandmother   . Breast cancer Other   . Cancer Other     breast  . Diabetes Mother     borderline  . Hypertension Mother   . Thyroid disease Mother   . Colon cancer Sister     81  . Colon cancer Cousin     40's  . Cancer Sister     colon   Past Surgical History  Procedure Laterality Date  . Arm surgery      due to fracture left elbow  . Cholecystectomy  5/07  . Tubal ligation  02-21-01  . Colonoscopy  2007  . Abdominal hysterectomy  09/27/13    Dr. Veryl Speak   Social History   Social History  . Marital Status: Married    Spouse Name: N/A  . Number of Children: 0  . Years of Education: N/A   Occupational History  . 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 Smoking status: Never Smoker   . Smokeless tobacco: Never Used  . Alcohol Use: No  . Drug Use: No  . Sexual Activity: Not Asked   Other Topics Concern  . None   Social History Narrative   Lives with husband, dog. 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 Hobbies - Quilting, gardening      Regular Exercise -  NO   Daily Caffeine Use:  1-2 cups coffee, 1-2 soda/tea                Review of Systems  Constitutional: Negative for fever, chills, appetite change, fatigue and unexpected weight change.  Eyes: Negative for visual disturbance.  Respiratory: Negative for cough and shortness of breath.   Cardiovascular: Negative for chest pain, palpitations and leg swelling.  Gastrointestinal: Negative for nausea, vomiting, abdominal pain, diarrhea and constipation.  Musculoskeletal: Negative for myalgias and arthralgias.  Skin: Negative for color change and rash.  Hematological: Negative for adenopathy. Does not bruise/bleed easily.  Psychiatric/Behavioral: Negative for dysphoric mood. The patient is not nervous/anxious.        Objective:    BP 130/85  mmHg  Pulse 55  Temp(Src) 97.8 F (36.6 C) (Oral)  Ht 5\' 3"  (1.6 m)  Wt 290 lb 6 oz (131.713 kg)  BMI 51.45 kg/m2  SpO2 96% Physical Exam  Constitutional: She is oriented to person, place, and time. She appears well-developed and well-nourished. No distress.  HENT:  Head: Normocephalic and atraumatic.  Right Ear: External ear normal.  Left Ear: External ear normal.  Nose: Nose normal.  Mouth/Throat: Oropharynx is clear and moist. No oropharyngeal exudate.  Eyes: Conjunctivae are normal. Pupils are equal, round, and reactive to light. Right eye exhibits no discharge. Left eye exhibits no discharge. No scleral icterus.  Neck: Normal range of motion. Neck supple. No tracheal deviation present. No thyromegaly present.  Cardiovascular: Normal rate, regular rhythm, normal heart sounds and intact distal  pulses.  Exam reveals no gallop and no friction rub.   No murmur heard. Pulmonary/Chest: Effort normal and breath sounds normal. No respiratory distress. She has no wheezes. She has no rales. She exhibits no tenderness.  Musculoskeletal: Normal range of motion. She exhibits no edema or tenderness.  Lymphadenopathy:    She has no cervical adenopathy.  Neurological: She is alert and oriented to person, place, and time. No cranial nerve deficit. She exhibits normal muscle tone. Coordination normal.  Skin: Skin is warm and dry. No rash noted. She is not diaphoretic. No erythema. No pallor.  Psychiatric: She has a normal mood and affect. Her behavior is normal. Judgment and thought content normal.          Assessment & Plan:   Problem List Items Addressed This Visit      Unprioritized   Depression    Symptoms well controlled with Citalopram. Will continue.      Relevant Medications   citalopram (CELEXA) 20 MG tablet   Diabetes mellitus type 2 in obese ([place]) - Primary    BG slightly elevated in the mornings. Will check A1c with labs. Discussed starting Victoza to help with BG and appetite. Will make final decision after labs back.      Relevant Medications   losartan (COZAAR) 25 MG tablet   Other Relevant Orders   Comprehensive metabolic panel   Hemoglobin A1c   Lipid panel   Microalbumin / creatinine urine ratio   Obesity, Class III, BMI 40-49.9 (morbid obesity) (HCC)    Wt Readings from Last 3 Encounters:  01/08/15 290 lb 6 oz (131.713 kg)  06/22/14 285 lb 2 oz (129.332 kg)  05/18/14 290 lb 2 oz (131.6 kg)   Encouraged healthy diet and exercise. Will consider starting Victoza/Saxenda to help with appetite. Awaiting lab results.          Return in about 3 months (around 04/10/2015) for Recheck of Diabetes.

## 2015-01-08 NOTE — Assessment & Plan Note (Signed)
BG slightly elevated in the mornings. Will check A1c with labs. Discussed starting Victoza to help with BG and appetite. Will make final decision after labs back.

## 2015-01-08 NOTE — Patient Instructions (Addendum)
Labs today.  We will consider starting Victoza after labs back.

## 2015-01-09 ENCOUNTER — Other Ambulatory Visit: Payer: Self-pay | Admitting: Internal Medicine

## 2015-01-09 ENCOUNTER — Encounter: Payer: Self-pay | Admitting: Internal Medicine

## 2015-01-09 MED ORDER — LIRAGLUTIDE 18 MG/3ML ~~LOC~~ SOPN: 0.6000 mg | PEN_INJECTOR | Freq: Every day | SUBCUTANEOUS | Status: DC

## 2015-01-10 ENCOUNTER — Telehealth: Payer: Self-pay

## 2015-01-10 NOTE — Telephone Encounter (Signed)
PA started for Victoza on cover my meds. Awaiting results

## 2015-01-22 ENCOUNTER — Encounter: Payer: Self-pay | Admitting: Internal Medicine

## 2015-04-11 ENCOUNTER — Ambulatory Visit (INDEPENDENT_AMBULATORY_CARE_PROVIDER_SITE_OTHER): Payer: BLUE CROSS/BLUE SHIELD | Admitting: Internal Medicine

## 2015-04-11 ENCOUNTER — Encounter: Payer: Self-pay | Admitting: Internal Medicine

## 2015-04-11 VITALS — BP 105/67 | HR 62 | Temp 98.2°F | Ht 63.0 in | Wt 295.0 lb

## 2015-04-11 DIAGNOSIS — E1169 Type 2 diabetes mellitus with other specified complication: Secondary | ICD-10-CM

## 2015-04-11 DIAGNOSIS — E669 Obesity, unspecified: Secondary | ICD-10-CM | POA: Diagnosis not present

## 2015-04-11 DIAGNOSIS — E119 Type 2 diabetes mellitus without complications: Secondary | ICD-10-CM | POA: Diagnosis not present

## 2015-04-11 DIAGNOSIS — Z800 Family history of malignant neoplasm of digestive organs: Secondary | ICD-10-CM | POA: Diagnosis not present

## 2015-04-11 DIAGNOSIS — R0600 Dyspnea, unspecified: Secondary | ICD-10-CM

## 2015-04-11 LAB — CBC
HCT: 38.3 % (ref 36.0–46.0)
Hemoglobin: 12.6 g/dL (ref 12.0–15.0)
MCHC: 32.9 g/dL (ref 30.0–36.0)
MCV: 85.7 fl (ref 78.0–100.0)
Platelets: 389 10*3/uL (ref 150.0–400.0)
RBC: 4.46 Mil/uL (ref 3.87–5.11)
RDW: 14.2 % (ref 11.5–15.5)
WBC: 10.3 10*3/uL (ref 4.0–10.5)

## 2015-04-11 LAB — LIPID PANEL
CHOL/HDL RATIO: 4
CHOLESTEROL: 167 mg/dL (ref 0–200)
HDL: 38.1 mg/dL — AB (ref >39.00)
LDL CALC: 99 mg/dL (ref 0–99)
NonHDL: 129.27
TRIGLYCERIDES: 150 mg/dL — AB (ref 0.0–149.0)
VLDL: 30 mg/dL (ref 0.0–40.0)

## 2015-04-11 LAB — COMPREHENSIVE METABOLIC PANEL
ALK PHOS: 87 U/L (ref 39–117)
ALT: 12 U/L (ref 0–35)
AST: 14 U/L (ref 0–37)
Albumin: 4 g/dL (ref 3.5–5.2)
BUN: 11 mg/dL (ref 6–23)
CO2: 28 meq/L (ref 19–32)
Calcium: 9.2 mg/dL (ref 8.4–10.5)
Chloride: 103 mEq/L (ref 96–112)
Creatinine, Ser: 0.82 mg/dL (ref 0.40–1.20)
GFR: 75.81 mL/min (ref >60.00)
GLUCOSE: 141 mg/dL — AB (ref 70–99)
POTASSIUM: 4 meq/L (ref 3.5–5.1)
Sodium: 138 mEq/L (ref 135–145)
Total Bilirubin: 0.6 mg/dL (ref 0.2–1.2)
Total Protein: 7.1 g/dL (ref 6.0–8.3)

## 2015-04-11 LAB — MICROALBUMIN / CREATININE URINE RATIO
CREATININE, U: 193.9 mg/dL
MICROALB/CREAT RATIO: 0.4 mg/g (ref 0.0–30.0)

## 2015-04-11 LAB — HEMOGLOBIN A1C: Hgb A1c MFr Bld: 7.1 % — ABNORMAL HIGH (ref 4.6–6.5)

## 2015-04-11 LAB — TSH: TSH: 1.59 u[IU]/mL (ref 0.35–4.50)

## 2015-04-11 MED ORDER — FLUTICASONE PROPIONATE 50 MCG/ACT NA SUSP: 2.0000 | Freq: Every day | NASAL | Status: DC

## 2015-04-11 MED ORDER — METFORMIN HCL 500 MG PO TABS
500.0000 mg | ORAL_TABLET | Freq: Two times a day (BID) | ORAL | Status: DC
Start: 2015-04-11 — End: 2016-06-23

## 2015-04-11 MED ORDER — CITALOPRAM HYDROBROMIDE 20 MG PO TABS: 20.0000 mg | ORAL_TABLET | Freq: Every day | ORAL | Status: DC

## 2015-04-11 NOTE — Assessment & Plan Note (Signed)
Symptoms of dyspnea with exertion likely related to obesity and deconditioning. EKG today normal. Stress test in past reportedly normal. Will check CBC with labs. Encouraged her to follow up with cardiology and to start light exercise such as walking 10-1min daily.

## 2015-04-11 NOTE — Progress Notes (Signed)
Pre visit review using our clinic review tool, if applicable. No additional management support is needed unless otherwise documented below in the visit note. 

## 2015-04-11 NOTE — Assessment & Plan Note (Signed)
Wt Readings from Last 3 Encounters:  04/11/15 295 lb (133.811 kg)  01/08/15 290 lb 6 oz (131.713 kg)  06/22/14 285 lb 2 oz (129.332 kg)   Encouraged healthy diet and exercise. She is starting weight watchers.

## 2015-04-11 NOTE — Patient Instructions (Addendum)
Start Metformin 500mg  at bedtime, then after 1 week advance to twice daily if tolerating well.   Labs today.  Referral placed Dr. Bary Castilla for Colonoscopy.  Your EKG today is normal. Follow up with Cardiology as scheduled. Start light exercise, such as walking 10-40min daily.

## 2015-04-11 NOTE — Assessment & Plan Note (Signed)
Will set up follow up colonoscopy given her sister's history of colon cancer.

## 2015-04-11 NOTE — Assessment & Plan Note (Signed)
Will check A1c with labs. Start metformin 500mg . Encouraged healthy diet and exercise.

## 2015-04-11 NOTE — Progress Notes (Signed)
Subjective:    Patient ID: Shelby Estes, female    DOB: June 01, 1955, 60 y.o.   MRN: [id number]  HPI  60YO female presents for follow up.  DM - BG running near 150s in mornings and lower in afternoon. Took Metformin in the past, but had some diarrhea with this.  Planning to start back on Weight Watchers.  Dyspnea - over last few weeks. Has some shortness of breath when walking a short distance such as out to her car from a store. No chest pain. Has to sigh to get deep breath. Has follow up with cardiology in March. She reports that previous stress test was normal. No nausea or lightheadedness. Occasional fluttering during these episodes.   Wt Readings from Last 3 Encounters:  04/11/15 295 lb (133.811 kg)  01/08/15 290 lb 6 oz (131.713 kg)  06/22/14 285 lb 2 oz (129.332 kg)   BP Readings from Last 3 Encounters:  04/11/15 105/67  01/08/15 130/85  06/22/14 116/80    Past Medical History  Diagnosis Date  . Depression   . Anxiety   . GERD (gastroesophageal reflux disease)   . Vitamin D deficiency   . Elevated BP   . Kidney stone     Dr. Cyndie Mull, April 2014  . Renal cyst     Dr. Cyndie Mull  . SVT (supraventricular tachycardia) [facility])     s/p cardioversion  . Diabetes mellitus 2010   Family History  Problem Relation Age of Onset  . COPD Father   . Stroke Father   . Breast cancer Maternal Aunt   . Seizures Maternal Grandmother   . Breast cancer Other   . Cancer Other     breast  . Diabetes Mother     borderline  . Hypertension Mother   . Thyroid disease Mother   . Colon cancer Sister     106  . Colon cancer Cousin     40's  . Cancer Sister     colon   Past Surgical History  Procedure Laterality Date  . Arm surgery      due to fracture left elbow  . Cholecystectomy  5/07  . Tubal ligation  02-21-01  . Colonoscopy  2007  . Abdominal hysterectomy  09/27/13    Dr. Veryl Speak   Social History   Social History  . Marital Status:  Married    Spouse Name: N/A  . Number of Children: 0  . Years of Education: N/A   Occupational History  . 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 Smoking status: Never Smoker   . Smokeless tobacco: Never Used  . Alcohol Use: No  . Drug Use: No  . Sexual Activity: Not Asked   Other Topics Concern  . None   Social History Narrative   Lives with husband, dog. 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 Hobbies - Quilting, gardening      Regular Exercise -  NO   Daily Caffeine Use:  1-2 cups coffee, 1-2 soda/tea                Review of Systems  Constitutional: Negative for fever, chills, appetite change, fatigue and unexpected weight change.  Eyes: Negative for visual disturbance.  Respiratory: Positive for chest tightness and shortness of breath. Negative for cough.   Cardiovascular: Positive for palpitations. Negative for chest pain and leg swelling.  Gastrointestinal: Negative for nausea, vomiting, abdominal pain, diarrhea and constipation.  Musculoskeletal: Negative for myalgias and arthralgias.  Skin: Negative for color change and rash.  Hematological: Negative for adenopathy. Does not bruise/bleed easily.  Psychiatric/Behavioral: Negative for sleep disturbance and dysphoric mood. The patient is not nervous/anxious.        Objective:    BP 105/67 mmHg  Pulse 62  Temp(Src) 98.2 F (36.8 C) (Oral)  Ht 5\' 3"  (1.6 m)  Wt 295 lb (133.811 kg)  BMI 52.27 kg/m2  SpO2 96% Physical Exam  Constitutional: She is oriented to person, place, and time. She appears well-developed and well-nourished. No distress.  HENT:  Head: Normocephalic and atraumatic.  Right Ear: External ear normal.  Left Ear: External ear normal.  Nose: Nose normal.  Mouth/Throat: Oropharynx is clear and moist. No oropharyngeal exudate.  Eyes: Conjunctivae are normal. Pupils are equal, round, and reactive to light. Right eye exhibits no  discharge. Left eye exhibits no discharge. No scleral icterus.  Neck: Normal range of motion. Neck supple. No tracheal deviation present. No thyromegaly present.  Cardiovascular: Normal rate, regular rhythm, normal heart sounds and intact distal pulses.  Exam reveals no gallop and no friction rub.   No murmur heard. Pulmonary/Chest: Effort normal and breath sounds normal. No respiratory distress. She has no wheezes. She has no rales. She exhibits no tenderness.  Musculoskeletal: Normal range of motion. She exhibits no edema or tenderness.  Lymphadenopathy:    She has no cervical adenopathy.  Neurological: She is alert and oriented to person, place, and time. No cranial nerve deficit. She exhibits normal muscle tone. Coordination normal.  Skin: Skin is warm and dry. No rash noted. She is not diaphoretic. No erythema. No pallor.  Psychiatric: She has a normal mood and affect. Her behavior is normal. Judgment and thought content normal.          Assessment & Plan:   Problem List Items Addressed This Visit      Unprioritized   Diabetes mellitus type 2 in obese ([place]) - Primary    Will check A1c with labs. Start metformin 500mg . Encouraged healthy diet and exercise.       Relevant Medications   metFORMIN (GLUCOPHAGE) 500 MG tablet   Other Relevant Orders   Comprehensive metabolic panel   Hemoglobin A1c   Lipid panel   Microalbumin / creatinine urine ratio   Dyspnea    Symptoms of dyspnea with exertion likely related to obesity and deconditioning. EKG today normal. Stress test in past reportedly normal. Will check CBC with labs. Encouraged her to follow up with cardiology and to start light exercise such as walking 10-80min daily.      Relevant Orders   TSH   CBC   EKG 12-Lead (Completed)   Family history of colon cancer    Will set up follow up colonoscopy given her sister's history of colon cancer.      Relevant Orders   Ambulatory referral to General Surgery   Obesity,  Class III, BMI 40-49.9 (morbid obesity) (HCC)    Wt Readings from Last 3 Encounters:  04/11/15 295 lb (133.811 kg)  01/08/15 290 lb 6 oz (131.713 kg)  06/22/14 285 lb 2 oz (129.332 kg)   Encouraged healthy diet and exercise. She is starting weight watchers.      Relevant Medications   metFORMIN (GLUCOPHAGE) 500 MG tablet       Return in about 3 months (around 07/10/2015) for Recheck of Diabetes.

## 2015-04-12 ENCOUNTER — Telehealth: Payer: Self-pay

## 2015-04-12 NOTE — Telephone Encounter (Signed)
Prior auth for Fluticasone approved on cover my meds.

## 2015-04-13 NOTE — Telephone Encounter (Signed)
Drug approved from 04/12/2015-04/11/2016.

## 2015-05-01 ENCOUNTER — Ambulatory Visit (INDEPENDENT_AMBULATORY_CARE_PROVIDER_SITE_OTHER): Payer: BLUE CROSS/BLUE SHIELD | Admitting: General Surgery

## 2015-05-01 ENCOUNTER — Other Ambulatory Visit: Payer: Self-pay | Admitting: General Surgery

## 2015-05-01 ENCOUNTER — Encounter: Payer: Self-pay | Admitting: General Surgery

## 2015-05-01 VITALS — BP 138/78 | HR 70 | Resp 14 | Ht 64.0 in | Wt 290.0 lb

## 2015-05-01 DIAGNOSIS — Z1211 Encounter for screening for malignant neoplasm of colon: Secondary | ICD-10-CM | POA: Insufficient documentation

## 2015-05-01 MED ORDER — POLYETHYLENE GLYCOL 3350 17 GM/SCOOP PO POWD: 1.0000 | Freq: Once | ORAL | Status: DC

## 2015-05-01 NOTE — Patient Instructions (Addendum)
Colonoscopy A colonoscopy is an exam to look at the entire large intestine (colon). This exam can help find problems such as tumors, polyps, inflammation, and areas of bleeding. The exam takes about 1 hour.  LET [facility] CARE PROVIDER KNOW ABOUT:   Any allergies you have.  All medicines you are taking, including vitamins, herbs, eye drops, creams, and over-the-counter medicines.  Previous problems you or members of your family have had with the use of anesthetics.  Any blood disorders you have.  Previous surgeries you have had.  Medical conditions you have. RISKS AND COMPLICATIONS  Generally, this is a safe procedure. However, as with any procedure, complications can occur. Possible complications include:  Bleeding.  Tearing or rupture of the colon wall.  Reaction to medicines given during the exam.  Infection (rare). BEFORE THE PROCEDURE   Ask your health care provider about changing or stopping your regular medicines.  You may be prescribed an oral bowel prep. This involves drinking a large amount of medicated liquid, starting the day before your procedure. The liquid will cause you to have multiple loose stools until your stool is almost clear or light green. This cleans out your colon in preparation for the procedure.  Do not eat or drink anything else once you have started the bowel prep, unless your health care provider tells you it is safe to do so.  Arrange for someone to drive you home after the procedure. PROCEDURE   You will be given medicine to help you relax (sedative).  You will lie on your side with your knees bent.  A long, flexible tube with a light and camera on the end (colonoscope) will be inserted through the rectum and into the colon. The camera sends video back to a computer screen as it moves through the colon. The colonoscope also releases carbon dioxide gas to inflate the colon. This helps your health care provider see the area better.  During  the exam, your health care provider may take a small tissue sample (biopsy) to be examined under a microscope if any abnormalities are found.  The exam is finished when the entire colon has been viewed. AFTER THE PROCEDURE   Do not drive for 24 hours after the exam.  You may have a small amount of blood in your stool.  You may pass moderate amounts of gas and have mild abdominal cramping or bloating. This is caused by the gas used to inflate your colon during the exam.  Ask when your test results will be ready and how you will get your results. Make sure you get your test results.   This information is not intended to replace advice given to you by your health care provider. Make sure you discuss any questions you have with your health care provider.   Document Released: 02/29/2000 Document Revised: 12/22/2012 Document Reviewed: 11/08/2012 Elsevier Interactive Patient Education 2016 [employer].   Patient scheduled for colonoscopy.  Prep instructions given and script for Miralax completed.

## 2015-05-01 NOTE — Progress Notes (Signed)
Patient ID: Shelby Estes, female   DOB: 1955-05-12, 60 y.o.   MRN: [id number]  Chief Complaint  Patient presents with  . Other    colonoscopy    HPI Shelby Estes is a 60 y.o. female here today for evaluation colonoscopy. Last one 06/19/2005. Patient states no GI problems at this time.  Bowels are regular and every other day, no bleeding.  HPI  Past Medical History  Diagnosis Date  . Depression   . Anxiety   . GERD (gastroesophageal reflux disease)   . Vitamin D deficiency   . Elevated BP   . Kidney stone     Dr. Cyndie Mull, April 2014  . Renal cyst     Dr. Cyndie Mull  . SVT (supraventricular tachycardia) [facility])     s/p cardioversion  . Diabetes mellitus 2010    Past Surgical History  Procedure Laterality Date  . Arm surgery      due to fracture left elbow  . Tubal ligation  02-21-01  . Colonoscopy  2007    Dr Bary Castilla  . Abdominal hysterectomy  09/27/13    Dr. Veryl Speak  . Cholecystectomy  5/07    Dr Bary Castilla    Family History  Problem Relation Age of Onset  . COPD Father   . Stroke Father   . Breast cancer Maternal Aunt   . Seizures Maternal Grandmother   . Breast cancer Other   . Cancer Other     breast  . Diabetes Mother     borderline  . Hypertension Mother   . Thyroid disease Mother   . Colon cancer Sister     21  . Colon cancer Cousin     40's    Social History Social History  Substance Use Topics  . Smoking status: Never Smoker   . Smokeless tobacco: Never Used  . Alcohol Use: No    Allergies  Allergen Reactions  . Bupropion     Muscle aches   . Clarithromycin   . Enalapril     edema  . Phentermine     SVT  . Ceclor [Cefaclor] Swelling and Rash  . Penicillins Swelling and Rash  . Sulfa Antibiotics Swelling and Rash    Current Outpatient Prescriptions  Medication Sig Dispense Refill  . Cholecalciferol (VITAMIN D) 2000 UNITS CAPS Take 1 capsule by mouth daily.    . citalopram (CELEXA) 20 MG tablet Take 1  tablet (20 mg total) by mouth daily. 30 tablet 6  . fluticasone (FLONASE) 50 MCG/ACT nasal spray Place 2 sprays into both nostrils daily. 16 g 6  . furosemide (LASIX) 20 MG tablet     . losartan (COZAAR) 25 MG tablet     . metFORMIN (GLUCOPHAGE) 500 MG tablet Take 1 tablet (500 mg total) by mouth 2 (two) times daily with a meal. 180 tablet 3  . metoprolol tartrate (LOPRESSOR) 25 MG tablet Take 1 tablet (25 mg total) by mouth 2 (two) times daily. 60 tablet 6  . polyethylene glycol powder (GLYCOLAX/MIRALAX) powder Take 255 g by mouth once. 255 grams one bottle for colonoscopy prep 255 g 0   No current facility-administered medications for this visit.    Review of Systems Review of Systems  Constitutional: Negative.   Respiratory: Negative.   Cardiovascular: Negative.   Gastrointestinal: Negative.     Blood pressure 138/78, pulse 70, resp. rate 14, height 5\' 4"  (1.626 m), weight 290 lb (131.543 kg).  Physical Exam Physical Exam  Constitutional: She is oriented  to person, place, and time. She appears well-developed and well-nourished.  Neck: Neck supple.  Cardiovascular: Normal rate, regular rhythm and normal heart sounds.   Pulmonary/Chest: Effort normal and breath sounds normal.  Lymphadenopathy:    She has no cervical adenopathy.  Neurological: She is alert and oriented to person, place, and time.  Skin: Skin is warm and dry.    Data Reviewed 06/19/2005 screening colonoscopy reviewed: Normal.  Assessment    Candidate for screening colonoscopy    Plan        Colonoscopy with possible biopsy/polypectomy prn: Information regarding the procedure, including its potential risks and complications (including but not limited to perforation of the bowel, which may require emergency surgery to repair, and bleeding) was verbally given to the patient. Educational information regarding lower intestinal endoscopy was given to the patient. Written instructions for how to complete the  bowel prep using Miralax were provided. The importance of drinking ample fluids to avoid dehydration as a result of the prep emphasized.   The patient should not make use of metformin the day of the prep for procedure.  PCP:  Ronette Deter  This information has been scribed by Shelby Estes.     Shelby Estes 05/01/2015, 8:12 PM

## 2015-05-02 ENCOUNTER — Telehealth: Payer: Self-pay | Admitting: *Deleted

## 2015-05-02 NOTE — Telephone Encounter (Signed)
Message left for patient to call the office.

## 2015-05-02 NOTE — Telephone Encounter (Signed)
-----   Message from [name], MD sent at 05/01/2015  8:13 PM EST ----- The patient should be instructed not to make use of metformin the day of the prep for procedure. Thank you

## 2015-05-03 NOTE — Telephone Encounter (Signed)
Patient called back and was notified of change in diabetic medication for colonoscopy. This patient verbalizes understanding.

## 2015-05-29 ENCOUNTER — Ambulatory Visit (INDEPENDENT_AMBULATORY_CARE_PROVIDER_SITE_OTHER): Payer: BLUE CROSS/BLUE SHIELD | Admitting: Obstetrics and Gynecology

## 2015-05-29 ENCOUNTER — Encounter: Payer: Self-pay | Admitting: Obstetrics and Gynecology

## 2015-05-29 VITALS — BP 121/73 | HR 70 | Ht 64.0 in | Wt 291.3 lb

## 2015-05-29 DIAGNOSIS — Z780 Asymptomatic menopausal state: Secondary | ICD-10-CM | POA: Diagnosis not present

## 2015-05-29 DIAGNOSIS — E669 Obesity, unspecified: Secondary | ICD-10-CM

## 2015-05-29 DIAGNOSIS — N393 Stress incontinence (female) (male): Secondary | ICD-10-CM

## 2015-05-29 DIAGNOSIS — N952 Postmenopausal atrophic vaginitis: Secondary | ICD-10-CM | POA: Diagnosis not present

## 2015-05-29 MED ORDER — ESTRADIOL 0.1 MG/GM VA CREA: TOPICAL_CREAM | VAGINAL | Status: DC

## 2015-05-29 NOTE — Progress Notes (Signed)
ANNUAL PREVENTATIVE CARE GYN  ENCOUNTER NOTE  Subjective:       Shelby Estes is a 60 y.o. G0P0000 female here for a routine annual gynecologic exam.  Current complaints: 1.  Mild stress incontinence. 2.  Vaginal dryness   Gynecologic History No LMP recorded. Patient has had a hysterectomy. Contraception: status post hysterectomy robotic hysterectomy, [facility] (Dr. Claiborne Rigg) Last Pap: 2013. Results were: normal Last mammogram: 2016. Results were: BI-RADS Category 2: Benign  Obstetric History OB History  Gravida Para Term Preterm AB SAB TAB Ectopic Multiple Living  0 0 0 0 0 0 0 0 0 0         Past Medical History  Diagnosis Date  . Depression   . Anxiety   . GERD (gastroesophageal reflux disease)   . Vitamin D deficiency   . Elevated BP   . Kidney stone     Dr. Cyndie Mull, April 2014  . Renal cyst     Dr. Cyndie Mull  . SVT (supraventricular tachycardia) [facility])     s/p cardioversion  . Diabetes mellitus 2010    Past Surgical History  Procedure Laterality Date  . Arm surgery      due to fracture left elbow  . Tubal ligation  02-21-01  . Colonoscopy  2007    Dr Bary Castilla  . Abdominal hysterectomy  09/27/13    Dr. Veryl Speak  . Cholecystectomy  5/07    Dr Bary Castilla    Current Outpatient Prescriptions on File Prior to Visit  Medication Sig Dispense Refill  . Cholecalciferol (VITAMIN D) 2000 UNITS CAPS Take 1 capsule by mouth daily.    . citalopram (CELEXA) 20 MG tablet Take 1 tablet (20 mg total) by mouth daily. 30 tablet 6  . fluticasone (FLONASE) 50 MCG/ACT nasal spray Place 2 sprays into both nostrils daily. 16 g 6  . furosemide (LASIX) 20 MG tablet     . losartan (COZAAR) 25 MG tablet     . metFORMIN (GLUCOPHAGE) 500 MG tablet Take 1 tablet (500 mg total) by mouth 2 (two) times daily with a meal. 180 tablet 3  . metoprolol tartrate (LOPRESSOR) 25 MG tablet Take 1 tablet (25 mg total) by mouth 2 (two) times daily. 60 tablet 6  . polyethylene  glycol powder (GLYCOLAX/MIRALAX) powder Take 255 g by mouth once. 255 grams one bottle for colonoscopy prep 255 g 0   No current facility-administered medications on file prior to visit.    Allergies  Allergen Reactions  . Bupropion     Muscle aches   . Clarithromycin   . Enalapril     edema  . Phentermine     SVT  . Ceclor [Cefaclor] Swelling and Rash  . Penicillins Swelling and Rash  . Sulfa Antibiotics Swelling and Rash    Social History   Social History  . Marital Status: Married    Spouse Name: N/A  . Number of Children: 0  . Years of Education: N/A   Occupational History  . 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 Smoking status: Never Smoker   . Smokeless tobacco: Never Used  . Alcohol Use: No  . Drug Use: No  . Sexual Activity: Yes    Birth Control/ Protection: Surgical   Other Topics Concern  . Not on file   Social History Narrative   Lives with husband, dog. 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 [place], gardening  Regular Exercise -  NO   Daily Caffeine Use:  1-2 cups coffee, 1-2 soda/tea                Family History  Problem Relation Age of Onset  . COPD Father   . Stroke Father   . Breast cancer Maternal Aunt   . Seizures Maternal Grandmother   . Breast cancer Other   . Cancer Other     breast  . Diabetes Mother     borderline  . Hypertension Mother   . Thyroid disease Mother   . Colon cancer Sister     9  . Colon cancer Cousin     63's    The following portions of the patient's history were reviewed and updated as appropriate: allergies, current medications, past family history, past medical history, past social history, past surgical history and problem list.  Review of Systems ROS Review of Systems - General ROS: negative for - chills, fatigue, fever, night sweats, weight gain or weight loss. Positive for -  mild hot flashes Psychological ROS:  negative for - anxiety, decreased libido, depression, mood swings, physical abuse or sexual abuse Ophthalmic ROS: negative for - blurry vision, eye pain or loss of vision ENT ROS: negative for - headaches, hearing change, visual changes or vocal changes Allergy and Immunology ROS: negative for - hives, itchy/watery eyes or seasonal allergies Hematological and Lymphatic ROS: negative for - bleeding problems, bruising, swollen lymph nodes or weight loss Endocrine ROS: negative for - galactorrhea, hair pattern changes, malaise/lethargy, mood swings, palpitations, polydipsia/polyuria, skin changes, temperature intolerance or unexpected weight changes. Positive for - mild vaginal dryness Breast ROS: negative for - new or changing breast lumps or nipple discharge Respiratory ROS: negative for - cough or shortness of breath Cardiovascular ROS: negative for - chest pain, irregular heartbeat, palpitations or shortness of breath Gastrointestinal ROS: no abdominal pain, change in bowel habits, or black or bloody stools Genito-Urinary ROS: no dysuria, trouble voiding, or hematuria. Positive for - mild stress incontinence Musculoskeletal ROS: negative for - joint pain or joint stiffness Neurological ROS: negative for - bowel and urinary changes.  Dermatological ROS: negative for rash and skin lesion changes   Objective:   BP 121/73 mmHg  Pulse 70  Ht 5\' 4"  (1.626 m)  Wt 291 lb 4.8 oz (132.133 kg)  BMI 49.98 kg/m2 CONSTITUTIONAL: Well-developed, well-nourished female in no acute distress.  PSYCHIATRIC: Normal mood and affect. Normal behavior. Normal judgment and thought content. Shelby Estes: Alert and oriented to person, place, and time. Normal muscle tone coordination. No cranial nerve deficit noted. HENT:  Normocephalic, atraumatic, External right and left ear normal. Oropharynx is clear and moist EYES: Conjunctivae and EOM are normal. Pupils are equal, round, and reactive to light. No scleral icterus.   NECK: Normal range of motion, supple, no masses.  Normal thyroid.  SKIN: Skin is warm and dry. No rash noted. Not diaphoretic. No erythema. No pallor. CARDIOVASCULAR: Normal heart rate noted, regular rhythm, no murmur. RESPIRATORY: Clear to auscultation bilaterally. Effort and breath sounds normal, no problems with respiration noted. BREASTS: Symmetric in size. No masses, skin changes, nipple drainage, or lymphadenopathy. ABDOMEN: Soft, normal bowel sounds, no distention noted.  No tenderness, rebound or guarding.  BLADDER: Normal PELVIC:  External Genitalia: Normal  BUS: Normal  Vagina: Mild atrophy  Cervix: Absent due to hysterectomy - intact vaginal cuff  Uterus: Absent  Adnexa: Absent  RV: External Exam NormaI, No Rectal Masses and Normal Sphincter tone  MUSCULOSKELETAL:  Normal range of motion. No tenderness.  No cyanosis, clubbing, or edema.  2+ distal pulses. LYMPHATIC: No Axillary, Supraclavicular, or Inguinal Adenopathy.    Assessment:   Annual gynecologic examination 60 y.o. Contraception: status post hysterectomy Obesity 2 Vaginal atrophy  Plan:  Pap: Not needed and Not done Mammogram: Not Ordered and Not Indicated Stool Guaiac Testing:  Ordered Routine preventative health maintenance measures emphasized: Exercise/Diet/Weight control and Stress Management  Stress urinary incontinence: recommended regular kegal exercises.  Patient given Estrase cream sample and rx, to use 0.5-1 g twice/week. Dr. Gilford Rile primary care will follow for routine screenings. Return to Shelby Estes, Shelby Estes  Shelby Mars, Shelby Estes   I have seen, interviewed, and examined the patient in conjunction with the Shelby Estes and affirm the diagnosis and management plan. Shelby Sullivant A. Shelby Blick, Shelby Estes, FACOG   Note: This dictation was prepared with Dragon dictation along with smaller phrase technology. Any transcriptional errors that result from this process  are unintentional.

## 2015-05-29 NOTE — Patient Instructions (Signed)
1. Estrace cream 1/2-1 g intravaginally twice weekly 2. Kegel exercises encouraged along with timed voiding 3. Weight loss encouraged 4. Return in 1 year for follow-up 5. Continue with wellness exams with Dr. Ronette Deter

## 2015-06-13 ENCOUNTER — Telehealth: Payer: Self-pay | Admitting: *Deleted

## 2015-06-13 NOTE — Telephone Encounter (Signed)
Patient called the office with a few questions about colonoscopy prep. This patient's questions were answered accordingly.   She reports she is still taking the same medications since her last office visit. Patient reports that she only takes metformin once daily. Medication list has been updated to reflect this as we had patient taking twice daily.   Patient states she has picked up Miralax prescription.   We will proceed with colonoscopy as scheduled for 06-20-15 at [facility].

## 2015-06-19 ENCOUNTER — Encounter: Payer: Self-pay | Admitting: *Deleted

## 2015-06-20 ENCOUNTER — Ambulatory Visit: Payer: BLUE CROSS/BLUE SHIELD | Admitting: Anesthesiology

## 2015-06-20 ENCOUNTER — Ambulatory Visit
Admission: RE | Admit: 2015-06-20 | Discharge: 2015-06-20 | Disposition: A | Discharge status: Home / Self Care | Payer: BLUE CROSS/BLUE SHIELD | Source: Ambulatory Visit | Attending: General Surgery | Admitting: General Surgery

## 2015-06-20 ENCOUNTER — Encounter
Admission: RE | Disposition: A | Discharge status: Home / Self Care | Payer: Self-pay | Source: Ambulatory Visit | Attending: General Surgery

## 2015-06-20 ENCOUNTER — Encounter: Payer: Self-pay | Admitting: *Deleted

## 2015-06-20 DIAGNOSIS — K219 Gastro-esophageal reflux disease without esophagitis: Secondary | ICD-10-CM | POA: Diagnosis not present

## 2015-06-20 DIAGNOSIS — Z882 Allergy status to sulfonamides status: Secondary | ICD-10-CM | POA: Diagnosis not present

## 2015-06-20 DIAGNOSIS — Z888 Allergy status to other drugs, medicaments and biological substances status: Secondary | ICD-10-CM | POA: Insufficient documentation

## 2015-06-20 DIAGNOSIS — Z880 Allergy status to penicillin: Secondary | ICD-10-CM | POA: Diagnosis not present

## 2015-06-20 DIAGNOSIS — E669 Obesity, unspecified: Secondary | ICD-10-CM | POA: Insufficient documentation

## 2015-06-20 DIAGNOSIS — E119 Type 2 diabetes mellitus without complications: Secondary | ICD-10-CM | POA: Insufficient documentation

## 2015-06-20 DIAGNOSIS — Z87442 Personal history of urinary calculi: Secondary | ICD-10-CM | POA: Diagnosis not present

## 2015-06-20 DIAGNOSIS — F419 Anxiety disorder, unspecified: Secondary | ICD-10-CM | POA: Insufficient documentation

## 2015-06-20 DIAGNOSIS — Z881 Allergy status to other antibiotic agents status: Secondary | ICD-10-CM | POA: Insufficient documentation

## 2015-06-20 DIAGNOSIS — F329 Major depressive disorder, single episode, unspecified: Secondary | ICD-10-CM | POA: Diagnosis not present

## 2015-06-20 DIAGNOSIS — Z6841 Body Mass Index (BMI) 40.0 and over, adult: Secondary | ICD-10-CM | POA: Insufficient documentation

## 2015-06-20 DIAGNOSIS — D122 Benign neoplasm of ascending colon: Secondary | ICD-10-CM | POA: Diagnosis not present

## 2015-06-20 DIAGNOSIS — I471 Supraventricular tachycardia: Secondary | ICD-10-CM | POA: Diagnosis not present

## 2015-06-20 DIAGNOSIS — Z90710 Acquired absence of both cervix and uterus: Secondary | ICD-10-CM | POA: Insufficient documentation

## 2015-06-20 DIAGNOSIS — Z9049 Acquired absence of other specified parts of digestive tract: Secondary | ICD-10-CM | POA: Insufficient documentation

## 2015-06-20 DIAGNOSIS — Z5181 Encounter for therapeutic drug level monitoring: Secondary | ICD-10-CM | POA: Insufficient documentation

## 2015-06-20 DIAGNOSIS — Z1211 Encounter for screening for malignant neoplasm of colon: Secondary | ICD-10-CM | POA: Diagnosis not present

## 2015-06-20 DIAGNOSIS — E559 Vitamin D deficiency, unspecified: Secondary | ICD-10-CM | POA: Diagnosis not present

## 2015-06-20 DIAGNOSIS — Z7984 Long term (current) use of oral hypoglycemic drugs: Secondary | ICD-10-CM | POA: Insufficient documentation

## 2015-06-20 HISTORY — PX: COLONOSCOPY WITH PROPOFOL: SHX5780

## 2015-06-20 LAB — GLUCOSE, CAPILLARY: GLUCOSE-CAPILLARY: 144 mg/dL — AB (ref 65–99)

## 2015-06-20 SURGERY — COLONOSCOPY WITH PROPOFOL
Anesthesia: General

## 2015-06-20 MED ORDER — LIDOCAINE HCL (CARDIAC) 20 MG/ML IV SOLN
INTRAVENOUS | Status: DC | PRN
  Administered 2015-06-20: 30 mg via INTRAVENOUS

## 2015-06-20 MED ORDER — MIDAZOLAM HCL 5 MG/5ML IJ SOLN
INTRAMUSCULAR | Status: DC | PRN
  Administered 2015-06-20: 1 mg via INTRAVENOUS

## 2015-06-20 MED ORDER — FENTANYL CITRATE (PF) 100 MCG/2ML IJ SOLN
INTRAMUSCULAR | Status: DC | PRN
  Administered 2015-06-20: 50 ug via INTRAVENOUS

## 2015-06-20 MED ORDER — SODIUM CHLORIDE 0.9 % IV SOLN
INTRAVENOUS | Status: DC
  Administered 2015-06-20: 1000 mL via INTRAVENOUS

## 2015-06-20 MED ORDER — IPRATROPIUM-ALBUTEROL 0.5-2.5 (3) MG/3ML IN SOLN: 3.0000 mL | Freq: Four times a day (QID) | RESPIRATORY_TRACT | Status: DC

## 2015-06-20 MED ORDER — IPRATROPIUM-ALBUTEROL 0.5-2.5 (3) MG/3ML IN SOLN
RESPIRATORY_TRACT | Status: AC
  Administered 2015-06-20: 3 mL via RESPIRATORY_TRACT
  Filled 2015-06-20: qty 3

## 2015-06-20 MED ORDER — PROPOFOL 10 MG/ML IV BOLUS
INTRAVENOUS | Status: DC | PRN
  Administered 2015-06-20: 50 mg via INTRAVENOUS

## 2015-06-20 MED ORDER — PROPOFOL 500 MG/50ML IV EMUL
INTRAVENOUS | Status: DC | PRN
  Administered 2015-06-20: 160 ug/kg/min via INTRAVENOUS

## 2015-06-20 NOTE — Anesthesia Postprocedure Evaluation (Signed)
Anesthesia Post Note  Patient: Shelby Estes  Procedure(s) Performed: Procedure(s) (LRB): COLONOSCOPY WITH PROPOFOL (N/A)  Patient location during evaluation: PACU Anesthesia Type: General Level of consciousness: awake Pain management: pain level controlled Vital Signs Assessment: post-procedure vital signs reviewed and stable Respiratory status: spontaneous breathing Cardiovascular status: blood pressure returned to baseline Anesthetic complications: no    Last Vitals:  Filed Vitals:   06/20/15 0840 06/20/15 0850  BP: 92/63 109/68  Pulse: 69 71  Temp:    Resp: 17 18    Last Pain: There were no vitals filed for this visit.               VAN STAVEREN,[name]

## 2015-06-20 NOTE — H&P (Signed)
Shelby Estes:[id number] 1955/04/22     HPI:  Candidate for screening colonoscopy. Tolerated prep well.   Prescriptions prior to admission  Medication Sig Dispense Refill Last Dose  . Cholecalciferol (VITAMIN D) 2000 UNITS CAPS Take 1 capsule by mouth daily.   06/20/2015 at 0530  . citalopram (CELEXA) 20 MG tablet Take 1 tablet (20 mg total) by mouth daily. 30 tablet 6 06/20/2015 at 0530  . losartan (COZAAR) 25 MG tablet    06/20/2015 at 0530  . metoprolol tartrate (LOPRESSOR) 25 MG tablet Take 1 tablet (25 mg total) by mouth 2 (two) times daily. 60 tablet 6 06/20/2015 at 0530  . estradiol (ESTRACE) 0.1 MG/GM vaginal cream Apply 1/2-1 gram per vagina twice weekly 30 g 12   . fluticasone (FLONASE) 50 MCG/ACT nasal spray Place 2 sprays into both nostrils daily. 16 g 6 Taking  . furosemide (LASIX) 20 MG tablet    Taking  . metFORMIN (GLUCOPHAGE) 500 MG tablet Take 1 tablet (500 mg total) by mouth 2 (two) times daily with a meal. (Patient taking differently: Take 500 mg by mouth. Once daily.) 180 tablet 3 Taking  . polyethylene glycol powder (GLYCOLAX/MIRALAX) powder Take 255 g by mouth once. 255 grams one bottle for colonoscopy prep 255 g 0 Taking   Allergies  Allergen Reactions  . Bupropion     Muscle aches   . Clarithromycin   . Enalapril     edema  . Phentermine     SVT  . Ceclor [Cefaclor] Swelling and Rash  . Penicillins Swelling and Rash  . Sulfa Antibiotics Swelling and Rash   Past Medical History  Diagnosis Date  . Depression   . Anxiety   . GERD (gastroesophageal reflux disease)   . Vitamin D deficiency   . Elevated BP   . Kidney stone     Dr. Cyndie Mull, April 2014  . Renal cyst     Dr. Cyndie Mull  . SVT (supraventricular tachycardia) [facility])     s/p cardioversion  . Diabetes mellitus 2010   Past Surgical History  Procedure Laterality Date  . Arm surgery      due to fracture left elbow  . Tubal ligation  02-21-01  . Colonoscopy  2007    Dr Bary Castilla  .  Abdominal hysterectomy  09/27/13    Dr. Veryl Speak  . Cholecystectomy  5/07    Dr Bary Castilla   Social History   Social History  . Marital Status: Married    Spouse Name: N/A  . Number of Children: 0  . Years of Education: N/A   Occupational History  . 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 Smoking status: Never Smoker   . Smokeless tobacco: Never Used  . Alcohol Use: No  . Drug Use: No  . Sexual Activity: Yes    Birth Control/ Protection: Surgical   Other Topics Concern  . Not on file   Social History Narrative   Lives with husband, dog. 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 Hobbies - Quilting, gardening      Regular Exercise -  NO   Daily Caffeine Use:  1-2 cups coffee, 1-2 soda/tea               Social History   Social History Narrative   Lives with husband, dog. 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 Hobbies - Quilting, gardening      Regular Exercise -  NO  Daily Caffeine Use:  1-2 cups coffee, 1-2 soda/tea                 ROS: Negative.     PE: HEENT: Negative. Lungs: Clear. Cardio: RR. Shelby Estes 06/20/2015   Assessment/Plan:  Proceed with planned endoscopy.

## 2015-06-20 NOTE — Transfer of Care (Signed)
Immediate Anesthesia Transfer of Care Note  Patient: Shelby Estes  Procedure(s) Performed: Procedure(s): COLONOSCOPY WITH PROPOFOL (N/A)  Patient Location: PACU and Short Stay  Anesthesia Type:General  Level of Consciousness: awake and patient cooperative  Airway & Oxygen Therapy: Patient Spontanous Breathing and Patient connected to nasal cannula oxygen  Post-op Assessment: Report given to RN and Post -op Vital signs reviewed and stable  Post vital signs: Reviewed and stable  Last Vitals:  Filed Vitals:   06/20/15 0716  BP: 108/78  Pulse: 66  Temp: 35.8 C  Resp: 18    Complications: No apparent anesthesia complications

## 2015-06-20 NOTE — OR Nursing (Addendum)
P.T CAME OUT COUGHING.SUCTIONED ; HOB RAISED.CONTINUE TO COUGH . SLIGHT WHEEZE FROM COUGHING the patient CRYING DOES NOT KNOW WHY. BREATHING TREATMENT DONE WITH RELIEF. LUNGS CLEAR ON DISCHARGE.DR 'S BRYNETT AND VANSTAVERN IN.

## 2015-06-20 NOTE — Op Note (Signed)
[facility] Gastroenterology Patient Name: Shelby Estes Procedure Date: 06/20/2015 7:51 AM MRN: [id number] Account #: [id number] Date of Birth: 1955-07-06 Admit Type: Outpatient Age: 60 Room: [facility] ENDO ROOM 1 Gender: Female Note Status: Finalized Procedure:            Colonoscopy Indications:          Screening for colorectal malignant neoplasm Providers:            [name], MD Referring MD:         Hewitt Blade. Sarina Ser, MD (Referring MD) Medicines:            Monitored Anesthesia Care Complications:        No immediate complications. Procedure:            Pre-Anesthesia Assessment:                       - Prior to the procedure, a History and Physical was                        performed, and patient medications, allergies and                        sensitivities were reviewed. The patient's tolerance of                        previous anesthesia was reviewed.                       - The risks and benefits of the procedure and the                        sedation options and risks were discussed with the                        patient. All questions were answered and informed                        consent was obtained.                       After obtaining informed consent, the colonoscope was                        passed under direct vision. Throughout the procedure,                        the patient's blood pressure, pulse, and oxygen                        saturations were monitored continuously. The                        Colonoscope was introduced through the anus and                        advanced to the the cecum, identified by the                        appendiceal orifice, ileocecal valve and palpation. The  colonoscopy was performed without difficulty. The                        patient tolerated the procedure well. The quality of                        the bowel preparation was excellent. Findings:      A 6 mm polyp  was found in the ascending colon distal ascending colon.       The polyp was sessile. Biopsies were taken with a cold forceps for       histology.      The retroflexed view of the distal rectum and anal verge was normal and       showed no anal or rectal abnormalities. Impression:           - One 6 mm polyp in the ascending colon in the distal                        ascending colon. Biopsied.                       - The distal rectum and anal verge are normal on                        retroflexion view. Recommendation:       - Telephone endoscopist for pathology results in 1 week. Procedure Code(s):    --- Professional ---                       [id number], Colonoscopy, flexible; with biopsy, single or                        multiple Diagnosis Code(s):    --- Professional ---                       Z12.11, Encounter for screening for malignant neoplasm                        of colon                       D12.2, Benign neoplasm of ascending colon CPT copyright 2016 American Medical Association. All rights reserved. The codes documented in this report are preliminary and upon coder review may  be revised to meet current compliance requirements. [name], MD 06/20/2015 8:23:40 AM This report has been signed electronically. Number of Addenda: 0 Note Initiated On: 06/20/2015 7:51 AM Scope Withdrawal Time: 0 hours 18 minutes 0 seconds  Total Procedure Duration: 0 hours 23 minutes 12 seconds       [facility]

## 2015-06-20 NOTE — Anesthesia Preprocedure Evaluation (Signed)
Anesthesia Evaluation  Patient identified by MRN, date of birth, ID band Patient awake    Reviewed: Allergy & Precautions, NPO status   Airway Mallampati: III       Dental  (+) Teeth Intact   Pulmonary shortness of breath and with exertion, sleep apnea and Continuous Positive Airway Pressure Ventilation ,     + decreased breath sounds      Cardiovascular hypertension, Pt. on home beta blockers  Rhythm:Regular     Neuro/Psych    GI/Hepatic Neg liver ROS, GERD  ,  Endo/Other  diabetes, Well Controlled, Type 2, Oral Hypoglycemic Agents  Renal/GU negative Renal ROS     Musculoskeletal negative musculoskeletal ROS (+)   Abdominal (+) + obese,   Peds  Hematology negative hematology ROS (+)   Anesthesia Other Findings   Reproductive/Obstetrics                             Anesthesia Physical Anesthesia Plan  ASA: III  Anesthesia Plan: General   Post-op Pain Management:    Induction: Intravenous  Airway Management Planned: Natural Airway and Nasal Cannula  Additional Equipment:   Intra-op Plan:   Post-operative Plan:   Informed Consent: I have reviewed the patients History and Physical, chart, labs and discussed the procedure including the risks, benefits and alternatives for the proposed anesthesia with the patient or authorized representative who has indicated his/her understanding and acceptance.     Plan Discussed with: CRNA  Anesthesia Plan Comments:         Anesthesia Quick Evaluation

## 2015-06-21 LAB — SURGICAL PATHOLOGY

## 2015-07-11 ENCOUNTER — Ambulatory Visit (INDEPENDENT_AMBULATORY_CARE_PROVIDER_SITE_OTHER): Payer: BLUE CROSS/BLUE SHIELD | Admitting: Internal Medicine

## 2015-07-11 ENCOUNTER — Encounter: Payer: Self-pay | Admitting: Internal Medicine

## 2015-07-11 VITALS — BP 102/72 | HR 57 | Ht 64.0 in | Wt 290.0 lb

## 2015-07-11 DIAGNOSIS — E119 Type 2 diabetes mellitus without complications: Secondary | ICD-10-CM

## 2015-07-11 DIAGNOSIS — F329 Major depressive disorder, single episode, unspecified: Secondary | ICD-10-CM | POA: Diagnosis not present

## 2015-07-11 DIAGNOSIS — E669 Obesity, unspecified: Secondary | ICD-10-CM

## 2015-07-11 DIAGNOSIS — E1169 Type 2 diabetes mellitus with other specified complication: Secondary | ICD-10-CM

## 2015-07-11 DIAGNOSIS — F32A Depression, unspecified: Secondary | ICD-10-CM

## 2015-07-11 LAB — COMPREHENSIVE METABOLIC PANEL
ALT: 15 U/L (ref 0–35)
AST: 14 U/L (ref 0–37)
Albumin: 4 g/dL (ref 3.5–5.2)
Alkaline Phosphatase: 93 U/L (ref 39–117)
BUN: 16 mg/dL (ref 6–23)
CHLORIDE: 102 meq/L (ref 96–112)
CO2: 28 mEq/L (ref 19–32)
Calcium: 9.4 mg/dL (ref 8.4–10.5)
Creatinine, Ser: 0.79 mg/dL (ref 0.40–1.20)
GFR: 79.08 mL/min (ref >60.00)
GLUCOSE: 138 mg/dL — AB (ref 70–99)
POTASSIUM: 3.9 meq/L (ref 3.5–5.1)
SODIUM: 138 meq/L (ref 135–145)
TOTAL PROTEIN: 7.2 g/dL (ref 6.0–8.3)
Total Bilirubin: 0.4 mg/dL (ref 0.2–1.2)

## 2015-07-11 LAB — HEMOGLOBIN A1C: Hgb A1c MFr Bld: 7 % — ABNORMAL HIGH (ref 4.6–6.5)

## 2015-07-11 MED ORDER — VENLAFAXINE HCL ER 37.5 MG PO CP24: 37.5000 mg | ORAL_CAPSULE | Freq: Every day | ORAL | Status: DC

## 2015-07-11 NOTE — Assessment & Plan Note (Signed)
Wt Readings from Last 3 Encounters:  07/11/15 290 lb (131.543 kg)  06/20/15 291 lb (131.997 kg)  05/29/15 291 lb 4.8 oz (132.133 kg)   Body mass index is 49.75 kg/(m^2). Encouraged healthy diet and physical activity.

## 2015-07-11 NOTE — Progress Notes (Signed)
Subjective:    Patient ID: Shelby Estes, female    DOB: December 07, 1955, 60 y.o.   MRN: [id number]  HPI  60YO female presents for follow up.  DM. - Limiting carbs in diet. Not checking BG much.  Feeling tired and on edge all the time. Crying all the time. Tough time at home with work and caring for her mother. Financial stress. No suicidal ideation.   Lab Results  Component Value Date   HGBA1C 7.1* 04/11/2015     Wt Readings from Last 3 Encounters:  07/11/15 290 lb (131.543 kg)  06/20/15 291 lb (131.997 kg)  05/29/15 291 lb 4.8 oz (132.133 kg)   BP Readings from Last 3 Encounters:  07/11/15 102/72  06/20/15 101/64  05/29/15 121/73    Past Medical History  Diagnosis Date  . Depression   . Anxiety   . GERD (gastroesophageal reflux disease)   . Vitamin D deficiency   . Elevated BP   . Kidney stone     Dr. Cyndie Mull, April 2014  . Renal cyst     Dr. Cyndie Mull  . SVT (supraventricular tachycardia) [facility])     s/p cardioversion  . Diabetes mellitus 2010   Family History  Problem Relation Age of Onset  . COPD Father   . Stroke Father   . Breast cancer Maternal Aunt   . Seizures Maternal Grandmother   . Breast cancer Other   . Cancer Other     breast  . Diabetes Mother     borderline  . Hypertension Mother   . Thyroid disease Mother   . Colon cancer Sister     69  . Colon cancer Cousin     3's   Past Surgical History  Procedure Laterality Date  . Arm surgery      due to fracture left elbow  . Tubal ligation  02-21-01  . Colonoscopy  2007    Dr Bary Castilla  . Abdominal hysterectomy  09/27/13    Dr. Veryl Speak  . Cholecystectomy  5/07    Dr Bary Castilla  . Colonoscopy with propofol N/A 06/20/2015    Procedure: COLONOSCOPY WITH PROPOFOL;  Surgeon: [name], MD;  Location: [facility] ENDOSCOPY;  Service: Endoscopy;  Laterality: N/A;   Social History   Social History  . Marital Status: Married    Spouse Name: N/A  . Number of Children:  0  . Years of Education: N/A   Occupational History  . [employer] engineer Other   Social History Main Topics  . Smoking status: Never Smoker   . Smokeless tobacco: Never Used  . Alcohol Use: No  . Drug Use: No  . Sexual Activity: Yes    Birth Control/ Protection: Surgical   Other Topics Concern  . None   Social History Narrative   Lives with husband, dog. 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 Hobbies - Quilting, gardening      Regular Exercise -  NO   Daily Caffeine Use:  1-2 cups coffee, 1-2 soda/tea                Review of Systems  Constitutional: Positive for fatigue. Negative for fever, chills, appetite change and unexpected weight change.  Eyes: Negative for visual disturbance.  Respiratory: Negative for cough and shortness of breath.   Cardiovascular: Negative for chest pain, palpitations and leg swelling.  Gastrointestinal: Negative for abdominal pain, diarrhea and constipation.  Skin: Negative for color change and rash.  Hematological: Negative for adenopathy. Does not bruise/bleed easily.  Psychiatric/Behavioral: Positive for dysphoric mood. Negative for suicidal ideas and sleep disturbance. The patient is nervous/anxious.        Objective:    BP 102/72 mmHg  Pulse 57  Ht 5\' 4"  (1.626 m)  Wt 290 lb (131.543 kg)  BMI 49.75 kg/m2  SpO2 96% Physical Exam  Constitutional: She is oriented to person, place, and time. She appears well-developed and well-nourished. No distress.  HENT:  Head: Normocephalic and atraumatic.  Right Ear: External ear normal.  Left Ear: External ear normal.  Nose: Nose normal.  Mouth/Throat: Oropharynx is clear and moist. No oropharyngeal exudate.  Eyes: Conjunctivae are normal. Pupils are equal, round, and reactive to light. Right eye exhibits no discharge. Left eye exhibits no discharge. No scleral icterus.  Neck: Normal range of motion. Neck supple. No tracheal deviation present.  No thyromegaly present.  Cardiovascular: Normal rate, regular rhythm, normal heart sounds and intact distal pulses.  Exam reveals no gallop and no friction rub.   No murmur heard. Pulmonary/Chest: Effort normal and breath sounds normal. No respiratory distress. She has no wheezes. She has no rales. She exhibits no tenderness.  Musculoskeletal: Normal range of motion. She exhibits no edema or tenderness.  Lymphadenopathy:    She has no cervical adenopathy.  Neurological: She is alert and oriented to person, place, and time. No cranial nerve deficit. She exhibits normal muscle tone. Coordination normal.  Skin: Skin is warm and dry. No rash noted. She is not diaphoretic. No erythema. No pallor.  Psychiatric: Her speech is normal and behavior is normal. Judgment and thought content normal. Her mood appears anxious. Cognition and memory are normal. She exhibits a depressed mood. She expresses no suicidal ideation.          Assessment & Plan:   Problem List Items Addressed This Visit      Unprioritized   Depression    Worsening depression with increased stressors at home. Offered support today. Will start Effexor 37.5mg  daily. Continue Celexa. Follow up by email in 1- 2 weeks and in visit in 4 weeks and prn.      Relevant Medications   venlafaxine XR (EFFEXOR-XR) 37.5 MG 24 hr capsule   Diabetes mellitus type 2 in obese (HCC) - Primary    A1c with labs today. Continue current medication. Encouraged healthy diet and exercise.      Relevant Orders   Comprehensive metabolic panel   Hemoglobin A1c   Obesity, Class III, BMI 40-49.9 (morbid obesity) (HCC)    Wt Readings from Last 3 Encounters:  07/11/15 290 lb (131.543 kg)  06/20/15 291 lb (131.997 kg)  05/29/15 291 lb 4.8 oz (132.133 kg)   Body mass index is 49.75 kg/(m^2). Encouraged healthy diet and physical activity.          Return in about 4 weeks (around 08/08/2015) for Recheck.  Ronette Deter, MD Internal  Medicine [facility]

## 2015-07-11 NOTE — Patient Instructions (Signed)
Start Effexor 37.5mg  in the morning.  Labs today.  Follow up 4 weeks.

## 2015-07-11 NOTE — Assessment & Plan Note (Signed)
>>  ASSESSMENT AND PLAN FOR ANXIETY AND DEPRESSION WRITTEN ON 07/11/2015  7:59 AM BY WALKER, JENNIFER A, MD  Worsening depression with increased stressors at home. Offered support today. Will start Effexor  37.5mg  daily. Continue Celexa . Follow up by email in 1- 2 weeks and in visit in 4 weeks and prn.

## 2015-07-11 NOTE — Progress Notes (Signed)
Pre visit review using our clinic review tool, if applicable. No additional management support is needed unless otherwise documented below in the visit note. 

## 2015-07-11 NOTE — Assessment & Plan Note (Signed)
A1c with labs today. Continue current medication. Encouraged healthy diet and exercise.

## 2015-07-11 NOTE — Assessment & Plan Note (Signed)
Worsening depression with increased stressors at home. Offered support today. Will start Effexor 37.5mg  daily. Continue Celexa. Follow up by email in 1- 2 weeks and in visit in 4 weeks and prn.

## 2015-08-09 ENCOUNTER — Ambulatory Visit (INDEPENDENT_AMBULATORY_CARE_PROVIDER_SITE_OTHER): Payer: BLUE CROSS/BLUE SHIELD | Admitting: Internal Medicine

## 2015-08-09 ENCOUNTER — Encounter: Payer: Self-pay | Admitting: Internal Medicine

## 2015-08-09 VITALS — BP 104/68 | HR 67 | Ht 64.0 in | Wt 288.8 lb

## 2015-08-09 DIAGNOSIS — F329 Major depressive disorder, single episode, unspecified: Secondary | ICD-10-CM

## 2015-08-09 DIAGNOSIS — E669 Obesity, unspecified: Secondary | ICD-10-CM | POA: Diagnosis not present

## 2015-08-09 DIAGNOSIS — E1169 Type 2 diabetes mellitus with other specified complication: Secondary | ICD-10-CM

## 2015-08-09 DIAGNOSIS — E119 Type 2 diabetes mellitus without complications: Secondary | ICD-10-CM | POA: Diagnosis not present

## 2015-08-09 DIAGNOSIS — F32A Depression, unspecified: Secondary | ICD-10-CM

## 2015-08-09 MED ORDER — VENLAFAXINE HCL ER 37.5 MG PO CP24: 37.5000 mg | ORAL_CAPSULE | Freq: Every day | ORAL | Status: DC

## 2015-08-09 NOTE — Patient Instructions (Signed)
Continue current medications    Follow up in 3 months

## 2015-08-09 NOTE — Progress Notes (Signed)
Subjective:    Patient ID: Shelby Estes, female    DOB: [birth year], 60 y.o.   MRN: [id number]  HPI  60YO female presents for follow up.  Last seen in 06/2015 for worsening depression and for DM follow up. Started on Effexor.  Symptoms improved. Thinking more clearly. Pain improved. No side effects noted. Feeling less depressed and has less anxiety.  Working on losing weight. Trying to follow healthier diet and stay active.  Wt Readings from Last 3 Encounters:  08/09/15 288 lb 12.8 oz (130.999 kg)  07/11/15 290 lb (131.543 kg)  06/20/15 291 lb (131.997 kg)   BP Readings from Last 3 Encounters:  08/09/15 104/68  07/11/15 102/72  06/20/15 101/64    Past Medical History  Diagnosis Date  . Depression   . Anxiety   . GERD (gastroesophageal reflux disease)   . Vitamin D deficiency   . Elevated BP   . Kidney stone     Dr. Cyndie Mull, April 2014  . Renal cyst     Dr. Cyndie Mull  . SVT (supraventricular tachycardia) [facility])     s/p cardioversion  . Diabetes mellitus 2010   Family History  Problem Relation Age of Onset  . COPD Father   . Stroke Father   . Breast cancer Maternal Aunt   . Seizures Maternal Grandmother   . Breast cancer Other   . Cancer Other     breast  . Diabetes Mother     borderline  . Hypertension Mother   . Thyroid disease Mother   . Colon cancer Sister     46  . Colon cancer Cousin     66's   Past Surgical History  Procedure Laterality Date  . Arm surgery      due to fracture left elbow  . Tubal ligation  02-21-01  . Colonoscopy  2007    Dr Bary Castilla  . Abdominal hysterectomy  09/27/13    Dr. Veryl Speak  . Cholecystectomy  5/07    Dr Bary Castilla  . Colonoscopy with propofol N/A 06/20/2015    Procedure: COLONOSCOPY WITH PROPOFOL;  Surgeon: [name], MD;  Location: [facility] ENDOSCOPY;  Service: Endoscopy;  Laterality: N/A;   Social History   Social History  . Marital Status: Married    Spouse Name: N/A  . Number  of Children: 0  . Years of Education: N/A   Occupational History  . [employer] engineer Other   Social History Main Topics  . Smoking status: Never Smoker   . Smokeless tobacco: Never Used  . Alcohol Use: No  . Drug Use: No  . Sexual Activity: Yes    Birth Control/ Protection: Surgical   Other Topics Concern  . None   Social History Narrative   Lives with husband, dog. 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 Hobbies - Quilting, gardening      Regular Exercise -  NO   Daily Caffeine Use:  1-2 cups coffee, 1-2 soda/tea                Review of Systems  Constitutional: Negative for fever, chills, appetite change, fatigue and unexpected weight change.  Eyes: Negative for visual disturbance.  Respiratory: Negative for shortness of breath.   Cardiovascular: Negative for chest pain and leg swelling.  Gastrointestinal: Negative for nausea, vomiting, abdominal pain, diarrhea and constipation.  Musculoskeletal: Negative for myalgias and arthralgias.  Skin: Negative for color change and rash.  Hematological: Negative for adenopathy.  Does not bruise/bleed easily.  Psychiatric/Behavioral: Negative for sleep disturbance and dysphoric mood. The patient is not nervous/anxious.        Objective:    BP 104/68 mmHg  Pulse 67  Ht 5\' 4"  (1.626 m)  Wt 288 lb 12.8 oz (130.999 kg)  BMI 49.55 kg/m2  SpO2 96% Physical Exam  Constitutional: She is oriented to person, place, and time. She appears well-developed and well-nourished. No distress.  HENT:  Head: Normocephalic and atraumatic.  Right Ear: External ear normal.  Left Ear: External ear normal.  Nose: Nose normal.  Mouth/Throat: Oropharynx is clear and moist. No oropharyngeal exudate.  Eyes: Conjunctivae are normal. Pupils are equal, round, and reactive to light. Right eye exhibits no discharge. Left eye exhibits no discharge. No scleral icterus.  Neck: Normal range of motion. Neck  supple. No tracheal deviation present. No thyromegaly present.  Cardiovascular: Normal rate, regular rhythm, normal heart sounds and intact distal pulses.  Exam reveals no gallop and no friction rub.   No murmur heard. Pulmonary/Chest: Effort normal and breath sounds normal. No respiratory distress. She has no wheezes. She has no rales. She exhibits no tenderness.  Musculoskeletal: Normal range of motion. She exhibits no edema or tenderness.  Lymphadenopathy:    She has no cervical adenopathy.  Neurological: She is alert and oriented to person, place, and time. No cranial nerve deficit. She exhibits normal muscle tone. Coordination normal.  Skin: Skin is warm and dry. No rash noted. She is not diaphoretic. No erythema. No pallor.  Psychiatric: She has a normal mood and affect. Her speech is normal and behavior is normal. Judgment and thought content normal. Her mood appears not anxious. Cognition and memory are normal. She does not exhibit a depressed mood. She expresses no suicidal ideation.          Assessment & Plan:   Problem List Items Addressed This Visit      Unprioritized   Depression - Primary    Symptoms markedly improved with addition of Effexor. Will continue for now. Discussed potentially increasing dose to 75mg  in the future if symptoms not as well controlled. Follow up in 3 months and sooner as needed.      Relevant Medications   venlafaxine XR (EFFEXOR-XR) 37.5 MG 24 hr capsule   Diabetes mellitus type 2 in obese (HCC)    BG well controlled by report. Will continue current medications.      Obesity, Class III, BMI 40-49.9 (morbid obesity) (HCC)    Wt Readings from Last 3 Encounters:  08/09/15 288 lb 12.8 oz (130.999 kg)  07/11/15 290 lb (131.543 kg)  06/20/15 291 lb (131.997 kg)   Body mass index is 49.55 kg/(m^2). Congratulated pt on weight loss. Encouraged continued efforts at healthy diet and exercise.          Return in about 3 months (around  11/09/2015) for Recheck.  Ronette Deter, MD Internal Medicine [facility]

## 2015-08-09 NOTE — Progress Notes (Signed)
Pre visit review using our clinic review tool, if applicable. No additional management support is needed unless otherwise documented below in the visit note. 

## 2015-08-09 NOTE — Assessment & Plan Note (Signed)
BG well controlled by report. Will continue current medications.

## 2015-08-09 NOTE — Assessment & Plan Note (Signed)
Symptoms markedly improved with addition of Effexor. Will continue for now. Discussed potentially increasing dose to 75mg  in the future if symptoms not as well controlled. Follow up in 3 months and sooner as needed.

## 2015-08-09 NOTE — Assessment & Plan Note (Signed)
Wt Readings from Last 3 Encounters:  08/09/15 288 lb 12.8 oz (130.999 kg)  07/11/15 290 lb (131.543 kg)  06/20/15 291 lb (131.997 kg)   Body mass index is 49.55 kg/(m^2). Congratulated pt on weight loss. Encouraged continued efforts at healthy diet and exercise.

## 2015-08-09 NOTE — Assessment & Plan Note (Signed)
>>  ASSESSMENT AND PLAN FOR ANXIETY AND DEPRESSION WRITTEN ON 08/09/2015  8:16 AM BY WALKER, JENNIFER A, MD  Symptoms markedly improved with addition of Effexor . Will continue for now. Discussed potentially increasing dose to 75mg  in the future if symptoms not as well controlled. Follow up in 3 months and sooner as needed.

## 2015-10-10 ENCOUNTER — Other Ambulatory Visit: Payer: Self-pay

## 2015-10-10 ENCOUNTER — Encounter: Payer: Self-pay | Admitting: Internal Medicine

## 2015-10-10 NOTE — Telephone Encounter (Signed)
error 

## 2015-11-12 ENCOUNTER — Ambulatory Visit: Payer: BLUE CROSS/BLUE SHIELD | Admitting: Internal Medicine

## 2015-12-24 ENCOUNTER — Telehealth: Payer: Self-pay | Admitting: Family Medicine

## 2015-12-24 NOTE — Telephone Encounter (Signed)
Spoke with patient and advised that Dr. Caryl Bis does not have labs drawn before appointments. He will order anything that needs to be drawn the day of the appointment.

## 2015-12-24 NOTE — Telephone Encounter (Signed)
Pt called wanting to get labs done before appt on 01/31/16. Need order please and thank you!  Call pt @ [phone].

## 2016-01-07 ENCOUNTER — Other Ambulatory Visit: Payer: Self-pay | Admitting: Surgical

## 2016-01-07 NOTE — Telephone Encounter (Signed)
IS it OK to refill this? Patient has appointment on 01/31/16. Former Environmental consultant patient

## 2016-01-07 NOTE — Telephone Encounter (Signed)
Please contact the patient. It appears that she is on Effexor currently per review of the last note. Please see if she is taking this or if she is taking Celexa. Thanks.

## 2016-01-08 ENCOUNTER — Other Ambulatory Visit: Payer: Self-pay | Admitting: Family Medicine

## 2016-01-08 NOTE — Telephone Encounter (Signed)
Patient scheduled with Dr. Caryl Bis 01/31/16 please advise can fill medication til appointment?

## 2016-01-08 NOTE — Telephone Encounter (Signed)
Pt states she's taking both effexor and celexa and she's out of this medication she wants it sent today to her pharmacy she said she's out and this refill was requested on Friday.

## 2016-01-08 NOTE — Telephone Encounter (Signed)
[facility] has requested a refill for citalopram

## 2016-01-08 NOTE — Telephone Encounter (Signed)
Please contact the patient to see if she is taking this medication or effexor. Per the last note she was to be on effexor. Thanks.

## 2016-01-09 NOTE — Telephone Encounter (Signed)
Noted. Please inform the patient this has been sent to her pharmacy. We will discuss this further at her visit. Thanks.

## 2016-01-31 ENCOUNTER — Ambulatory Visit (INDEPENDENT_AMBULATORY_CARE_PROVIDER_SITE_OTHER): Payer: BLUE CROSS/BLUE SHIELD | Admitting: Family Medicine

## 2016-01-31 ENCOUNTER — Encounter: Payer: Self-pay | Admitting: Family Medicine

## 2016-01-31 VITALS — BP 114/70 | HR 96 | Temp 98.4°F | Ht 64.0 in | Wt 277.6 lb

## 2016-01-31 DIAGNOSIS — E669 Obesity, unspecified: Secondary | ICD-10-CM | POA: Diagnosis not present

## 2016-01-31 DIAGNOSIS — R002 Palpitations: Secondary | ICD-10-CM | POA: Insufficient documentation

## 2016-01-31 DIAGNOSIS — E119 Type 2 diabetes mellitus without complications: Secondary | ICD-10-CM | POA: Diagnosis not present

## 2016-01-31 DIAGNOSIS — E1169 Type 2 diabetes mellitus with other specified complication: Secondary | ICD-10-CM

## 2016-01-31 DIAGNOSIS — F3342 Major depressive disorder, recurrent, in full remission: Secondary | ICD-10-CM | POA: Diagnosis not present

## 2016-01-31 HISTORY — DX: Palpitations: R00.2

## 2016-01-31 LAB — COMPREHENSIVE METABOLIC PANEL
ALK PHOS: 93 U/L (ref 39–117)
ALT: 19 U/L (ref 0–35)
AST: 20 U/L (ref 0–37)
Albumin: 4.6 g/dL (ref 3.5–5.2)
BUN: 13 mg/dL (ref 6–23)
CO2: 29 mEq/L (ref 19–32)
Calcium: 9.9 mg/dL (ref 8.4–10.5)
Chloride: 98 mEq/L (ref 96–112)
Creatinine, Ser: 0.85 mg/dL (ref 0.40–1.20)
GFR: 72.54 mL/min (ref >60.00)
GLUCOSE: 118 mg/dL — AB (ref 70–99)
POTASSIUM: 4.1 meq/L (ref 3.5–5.1)
SODIUM: 137 meq/L (ref 135–145)
TOTAL PROTEIN: 7.8 g/dL (ref 6.0–8.3)
Total Bilirubin: 0.7 mg/dL (ref 0.2–1.2)

## 2016-01-31 LAB — LIPID PANEL
CHOL/HDL RATIO: 4
Cholesterol: 197 mg/dL (ref 0–200)
HDL: 45.8 mg/dL (ref >39.00)
LDL Cholesterol: 121 mg/dL — ABNORMAL HIGH (ref 0–99)
NONHDL: 151.28
Triglycerides: 152 mg/dL — ABNORMAL HIGH (ref 0.0–149.0)
VLDL: 30.4 mg/dL (ref 0.0–40.0)

## 2016-01-31 LAB — HEMOGLOBIN A1C: HEMOGLOBIN A1C: 6.6 % — AB (ref 4.6–6.5)

## 2016-01-31 NOTE — Progress Notes (Signed)
Pre visit review using our clinic review tool, if applicable. No additional management support is needed unless otherwise documented below in the visit note. 

## 2016-01-31 NOTE — Patient Instructions (Signed)
Nice to see you. We are going to taper you off of your Effexor. If you have 12-15 tablets left at home please take this every other day until you run out. If you have less than this at home please give [place] a call and we can refill. If you develop worsening anxiety or depression, jitteriness, sweatiness, shakiness, or any new symptoms while tapering please let us know. We'll check lab work today and call you with the results.

## 2016-01-31 NOTE — Assessment & Plan Note (Signed)
Well-controlled at this time. We will taper her off of Effexor. She reports she has 12-15 tablets left at home and thus we will have her take that every other day until she runs out of medication. She will contact us if she does not have that many tablets at home. She is given return precautions.

## 2016-01-31 NOTE — Progress Notes (Signed)
  Tommi Rumps, MD Phone: [phone]  Shelby Estes is a 60 y.o. female who presents today for follow-up.  DIABETES Disease Monitoring: Blood Sugar ranges-92-175 Polyuria/phagia/dipsia- no      Visual problems- no, has not seen ophthalmology recently Medications: Compliance- taking metformin twice daily Hypoglycemic symptoms- no  Depression: Patient notes this is well controlled. She takes Celexa and Effexor. She started on Effexor in the spring to augment her Celexa. She has no depression or anxiety at this time.  Palpitations: Patient has a history of this with heart rates in the 190s. Has not had any in the last 2 years since she has been on metoprolol. Reports no chest pain or palpitations.  PMH: nonsmoker.   ROS see history of present illness  Objective  Physical Exam Vitals:   01/31/16 1430  BP: 114/70  Pulse: 96  Temp: 98.4 F (36.9 C)    BP Readings from Last 3 Encounters:  01/31/16 114/70  08/09/15 104/68  07/11/15 102/72   Wt Readings from Last 3 Encounters:  01/31/16 277 lb 9.6 oz (125.9 kg)  08/09/15 288 lb 12.8 oz (131 kg)  07/11/15 290 lb (131.5 kg)    Physical Exam  Constitutional: No distress.  HENT:  Head: Normocephalic and atraumatic.  Cardiovascular: Normal rate, regular rhythm and normal heart sounds.   Pulmonary/Chest: Effort normal and breath sounds normal.  Neurological: She is alert. Gait normal.  Skin: Skin is warm and dry. She is not diaphoretic.  Psychiatric: Mood and affect normal.     Assessment/Plan: Please see individual problem list.  Diabetes mellitus type 2 in obese Varying levels of control. She'll continue metformin. We'll check lab work. She is advised to see her eye doctor.  Depression Well-controlled at this time. We will taper her off of Effexor. She reports she has 12-15 tablets left at home and thus we will have her take that every other day until she runs out of medication. She will contact us if she does  not have that many tablets at home. She is given return precautions.  Palpitations No recent issues with this. She will continue metoprolol. Given return precautions.   Orders Placed This Encounter  Procedures  . HgB A1c  . Comp Met (CMET)  . Lipid Profile    Tommi Rumps, MD [facility]

## 2016-01-31 NOTE — Assessment & Plan Note (Signed)
No recent issues with this. She will continue metoprolol. Given return precautions.

## 2016-01-31 NOTE — Assessment & Plan Note (Signed)
Varying levels of control. She'll continue metformin. We'll check lab work. She is advised to see her eye doctor.

## 2016-01-31 NOTE — Assessment & Plan Note (Signed)
>>  ASSESSMENT AND PLAN FOR ANXIETY AND DEPRESSION WRITTEN ON 01/31/2016  3:28 PM BY SONNENBERG, ERIC G, MD  Well-controlled at this time. We will taper her off of Effexor . She reports she has 12-15 tablets left at home and thus we will have her take that every other day until she runs out of medication. She will contact us  if she does not have that many tablets at home. She is given return precautions.

## 2016-02-01 ENCOUNTER — Telehealth: Payer: Self-pay | Admitting: Family Medicine

## 2016-02-01 NOTE — Telephone Encounter (Signed)
Dr. Caryl Bis has not read lab results yet. Will call when he has read

## 2016-02-01 NOTE — Telephone Encounter (Signed)
Pt called requesting lab results. Thank you!  Call pt @ until 4 pm  [phone] After 4 pm [phone]

## 2016-02-04 NOTE — Telephone Encounter (Signed)
LM for patient to return call.

## 2016-02-05 ENCOUNTER — Telehealth: Payer: Self-pay | Admitting: Family Medicine

## 2016-02-05 NOTE — Telephone Encounter (Signed)
Pt requested a call in reference medication changes  Pt [phone]

## 2016-02-05 NOTE — Telephone Encounter (Signed)
Patient advised of results.

## 2016-02-05 NOTE — Telephone Encounter (Signed)
Patient advised see result note for documentation.

## 2016-02-22 DIAGNOSIS — M179 Osteoarthritis of knee, unspecified: Secondary | ICD-10-CM

## 2016-02-22 DIAGNOSIS — M1710 Unilateral primary osteoarthritis, unspecified knee: Secondary | ICD-10-CM | POA: Insufficient documentation

## 2016-02-22 DIAGNOSIS — M25569 Pain in unspecified knee: Secondary | ICD-10-CM | POA: Insufficient documentation

## 2016-02-22 HISTORY — DX: Osteoarthritis of knee, unspecified: M17.9

## 2016-02-22 HISTORY — DX: Pain in unspecified knee: M25.569

## 2016-02-28 DIAGNOSIS — M6740 Ganglion, unspecified site: Secondary | ICD-10-CM | POA: Insufficient documentation

## 2016-05-06 ENCOUNTER — Ambulatory Visit: Payer: BLUE CROSS/BLUE SHIELD | Admitting: Family Medicine

## 2016-05-12 ENCOUNTER — Other Ambulatory Visit: Payer: Self-pay

## 2016-05-12 DIAGNOSIS — Z1239 Encounter for other screening for malignant neoplasm of breast: Secondary | ICD-10-CM

## 2016-05-12 NOTE — Telephone Encounter (Signed)
Last OV 01/31/16 last filled by Dr.Walker 05/25/017  30 [id number]

## 2016-05-12 NOTE — Telephone Encounter (Signed)
She was supposed to taper off of this medicine previously. Please see if she did this. Thanks.

## 2016-05-13 MED ORDER — VENLAFAXINE HCL ER 37.5 MG PO CP24: 37.5000 mg | ORAL_CAPSULE | Freq: Every day | ORAL | 6 refills | Status: DC

## 2016-05-13 NOTE — Telephone Encounter (Signed)
Patient tapered off Celexa per OV in November. Patient would also like a mammogram order. Patient is coming in for follow up Friday.

## 2016-05-13 NOTE — Telephone Encounter (Signed)
Noted. Refill sent to pharmacy. Mammogram ordered.

## 2016-05-16 ENCOUNTER — Ambulatory Visit: Payer: BLUE CROSS/BLUE SHIELD | Admitting: Family Medicine

## 2016-05-28 LAB — HM DIABETES EYE EXAM

## 2016-05-29 ENCOUNTER — Encounter: Payer: Self-pay | Admitting: Family Medicine

## 2016-05-30 NOTE — Progress Notes (Signed)
ANNUAL PREVENTATIVE CARE GYN  ENCOUNTER NOTE  Subjective:       Shelby Estes is a 61 y.o. G0P0000 female here for a routine annual gynecologic exam.  Current complaints: 1.  Mild stress incontinence. 2.  Vaginal dryness   Gynecologic History No LMP recorded. Patient has had a hysterectomy. Contraception: status post hysterectomy robotic hysterectomy, BSO (Dr. Claiborne Rigg) Last Pap: 2013. Results were: normal Last mammogram: 2016. Results were: BI-RADS Category 2: Benign cat c  Obstetric History OB History  Gravida Para Term Preterm AB Living  0 0 0 0 0 0  SAB TAB Ectopic Multiple Live Births  0 0 0 0          Past Medical History:  Diagnosis Date  . Anxiety   . Depression   . Diabetes mellitus 2010  . Elevated BP   . GERD (gastroesophageal reflux disease)   . Kidney stone    Dr. Cyndie Mull, April 2014  . Renal cyst    Dr. Cyndie Mull  . SVT (supraventricular tachycardia) [facility])    s/p cardioversion  . Vitamin D deficiency     Past Surgical History:  Procedure Laterality Date  . ABDOMINAL HYSTERECTOMY  09/27/13   Dr. Veryl Speak  . Arm Surgery     due to fracture left elbow  . CHOLECYSTECTOMY  5/07   Dr Bary Castilla  . COLONOSCOPY  2007   Dr Bary Castilla  . COLONOSCOPY WITH PROPOFOL N/A 06/20/2015   Procedure: COLONOSCOPY WITH PROPOFOL;  Surgeon: [name], MD;  Location: [facility] ENDOSCOPY;  Service: Endoscopy;  Laterality: N/A;  . TUBAL LIGATION  02-21-01    Current Outpatient Prescriptions on File Prior to Visit  Medication Sig Dispense Refill  . Cholecalciferol (VITAMIN D) 2000 UNITS CAPS Take 1 capsule by mouth daily.    . fluticasone (FLONASE) 50 MCG/ACT nasal spray Place 2 sprays into both nostrils daily. 16 g 6  . furosemide (LASIX) 20 MG tablet     . losartan (COZAAR) 25 MG tablet     . metFORMIN (GLUCOPHAGE) 500 MG tablet Take 1 tablet (500 mg total) by mouth 2 (two) times daily with a meal. (Patient taking differently: Take 500 mg by  mouth. Once daily.) 180 tablet 3  . metoprolol tartrate (LOPRESSOR) 25 MG tablet Take 1 tablet (25 mg total) by mouth 2 (two) times daily. 60 tablet 6  . venlafaxine XR (EFFEXOR-XR) 37.5 MG 24 hr capsule Take 1 capsule (37.5 mg total) by mouth daily with breakfast. 30 capsule 6   No current facility-administered medications on file prior to visit.     Allergies  Allergen Reactions  . Bupropion     Muscle aches   . Clarithromycin   . Enalapril     edema  . Phentermine     SVT  . Ceclor [Cefaclor] Swelling and Rash  . Penicillins Swelling and Rash  . Sulfa Antibiotics Swelling and Rash    Social History   Social History  . Marital status: Married    Spouse name: N/A  . Number of children: 0  . Years of education: N/A   Occupational History  . 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 Smoking status: Never Smoker  . Smokeless tobacco: Never Used  . Alcohol use No  . Drug use: No  . Sexual activity: Yes    Birth control/ protection: Surgical   Other Topics Concern  . Not on file   Social History Narrative  Lives with husband, dog. 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 Hobbies - Quilting, gardening      Regular Exercise -  NO   Daily Caffeine Use:  1-2 cups coffee, 1-2 soda/tea                Family History  Problem Relation Age of Onset  . COPD Father   . Stroke Father   . Breast cancer Maternal Aunt   . Seizures Maternal Grandmother   . Breast cancer Other   . Cancer Other     breast  . Diabetes Mother     borderline  . Hypertension Mother   . Thyroid disease Mother   . Colon cancer Sister     60  . Colon cancer Cousin     61's    The following portions of the patient's history were reviewed and updated as appropriate: allergies, current medications, past family history, past medical history, past social history, past surgical history and problem list.  Review of Systems Review of  Systems  Constitutional: Positive for weight loss. Negative for chills, diaphoresis, fever and malaise/fatigue.  HENT: Negative.   Eyes: Negative.   Respiratory: Negative.   Cardiovascular: Negative.   Gastrointestinal: Negative for abdominal pain, constipation, diarrhea, nausea and vomiting.  Genitourinary: Negative.   Musculoskeletal: Negative.   Skin: Negative for itching and rash.  Neurological: Negative.  Negative for weakness.  Endo/Heme/Allergies: Negative.   Psychiatric/Behavioral: Negative.      Objective:   BP 124/80   Pulse 70   Ht 5\' 4"  (1.626 m)   Wt 272 lb 4.8 oz (123.5 kg)   BMI 46.74 kg/m  CONSTITUTIONAL: Well-developed, well-nourished female in no acute distress.  PSYCHIATRIC: Normal mood and affect. Normal behavior. Normal judgment and thought content. [place]: Alert and oriented to person, place, and time. Normal muscle tone coordination. No cranial nerve deficit noted. HENT:  Normocephalic, atraumatic, External right and left ear normal. Oropharynx is clear and moist EYES: Conjunctivae and EOM are normal. Pupils are equal, round, and reactive to light. No scleral icterus.  NECK: Normal range of motion, supple, no masses.  Normal thyroid.  SKIN: Skin is warm and dry. No rash noted. Not diaphoretic. No erythema. No pallor. CARDIOVASCULAR: Normal heart rate noted, regular rhythm, no murmur. RESPIRATORY: Clear to auscultation bilaterally. Effort and breath sounds normal, no problems with respiration noted. BREASTS: Symmetric in size. No masses, skin changes, nipple drainage, or lymphadenopathy. ABDOMEN: Soft, normal bowel sounds, no distention noted.  No tenderness, rebound or guarding.  BLADDER: Normal PELVIC:  External Genitalia: Normal  BUS: Normal  Vagina: Mild atrophy  Cervix: Absent due to hysterectomy - intact vaginal cuff  Uterus: Absent  Adnexa: Absent  RV: External Exam NormaI, No Rectal Masses and Normal Sphincter tone  MUSCULOSKELETAL: Normal  range of motion. No tenderness.  No cyanosis, clubbing, or edema.  2+ distal pulses. LYMPHATIC: No Axillary, Supraclavicular, or Inguinal Adenopathy.    Assessment:   Annual gynecologic examination 61 y.o. Contraception: status post hysterectomy bmi- 46 Vaginal atrophy  Plan:  Pap: no further paps needed Mammogram ; SCHEDULED TODAY Stool Guaiac Testing:  Colonoscopy 06/2015 Routine preventative health maintenance measures emphasized: Exercise/Diet/Weight control and Stress Management  Stress urinary incontinence: recommended regular kegal exercises.  Return to [place], [place]  Brayton Mars, MD  Note: This dictation was prepared with Dragon dictation along with smaller phrase technology. Any transcriptional errors that result from this process are unintentional.

## 2016-06-03 ENCOUNTER — Encounter: Payer: Self-pay | Admitting: Family Medicine

## 2016-06-03 ENCOUNTER — Ambulatory Visit (INDEPENDENT_AMBULATORY_CARE_PROVIDER_SITE_OTHER): Payer: BLUE CROSS/BLUE SHIELD | Admitting: Family Medicine

## 2016-06-03 ENCOUNTER — Ambulatory Visit (INDEPENDENT_AMBULATORY_CARE_PROVIDER_SITE_OTHER): Payer: BLUE CROSS/BLUE SHIELD | Admitting: Obstetrics and Gynecology

## 2016-06-03 ENCOUNTER — Encounter: Payer: Self-pay | Admitting: Obstetrics and Gynecology

## 2016-06-03 VITALS — BP 124/80 | HR 70 | Ht 64.0 in | Wt 272.3 lb

## 2016-06-03 VITALS — BP 134/80 | HR 92 | Temp 98.5°F | Wt 275.0 lb

## 2016-06-03 DIAGNOSIS — B9789 Other viral agents as the cause of diseases classified elsewhere: Secondary | ICD-10-CM | POA: Diagnosis not present

## 2016-06-03 DIAGNOSIS — Z780 Asymptomatic menopausal state: Secondary | ICD-10-CM

## 2016-06-03 DIAGNOSIS — K219 Gastro-esophageal reflux disease without esophagitis: Secondary | ICD-10-CM | POA: Diagnosis not present

## 2016-06-03 DIAGNOSIS — Z9989 Dependence on other enabling machines and devices: Secondary | ICD-10-CM | POA: Diagnosis not present

## 2016-06-03 DIAGNOSIS — F418 Other specified anxiety disorders: Secondary | ICD-10-CM | POA: Diagnosis not present

## 2016-06-03 DIAGNOSIS — E669 Obesity, unspecified: Secondary | ICD-10-CM | POA: Diagnosis not present

## 2016-06-03 DIAGNOSIS — F32A Depression, unspecified: Secondary | ICD-10-CM

## 2016-06-03 DIAGNOSIS — E1169 Type 2 diabetes mellitus with other specified complication: Secondary | ICD-10-CM

## 2016-06-03 DIAGNOSIS — F419 Anxiety disorder, unspecified: Secondary | ICD-10-CM

## 2016-06-03 DIAGNOSIS — G4733 Obstructive sleep apnea (adult) (pediatric): Secondary | ICD-10-CM | POA: Diagnosis not present

## 2016-06-03 DIAGNOSIS — N393 Stress incontinence (female) (male): Secondary | ICD-10-CM | POA: Diagnosis not present

## 2016-06-03 DIAGNOSIS — N952 Postmenopausal atrophic vaginitis: Secondary | ICD-10-CM

## 2016-06-03 DIAGNOSIS — J069 Acute upper respiratory infection, unspecified: Secondary | ICD-10-CM | POA: Diagnosis not present

## 2016-06-03 DIAGNOSIS — F329 Major depressive disorder, single episode, unspecified: Secondary | ICD-10-CM

## 2016-06-03 LAB — POCT GLYCOSYLATED HEMOGLOBIN (HGB A1C): HEMOGLOBIN A1C: 6.5

## 2016-06-03 LAB — HM MAMMOGRAPHY

## 2016-06-03 MED ORDER — VENLAFAXINE HCL ER 75 MG PO CP24: 75.0000 mg | ORAL_CAPSULE | Freq: Every day | ORAL | 1 refills | Status: DC

## 2016-06-03 MED ORDER — PANTOPRAZOLE SODIUM 40 MG PO TBEC: 40.0000 mg | DELAYED_RELEASE_TABLET | Freq: Every day | ORAL | 3 refills | Status: DC

## 2016-06-03 NOTE — Progress Notes (Signed)
Tommi Rumps, MD Phone: [phone]  Shelby Estes is a 61 y.o. female who presents today for f/u.  DIABETES Disease Monitoring: Blood Sugar ranges-100-180 Polyuria/phagia/dipsia- no      Medications: Compliance- taking metformin Hypoglycemic symptoms- no  Patient notes mild cough productive of clear mucus and some rhinorrhea with ear congestion over the last week. It has been improving quite a bit over the last several days. No sinus congestion. No fevers. Used Alka-Seltzer cold and sinus with some benefit.  Obstructive sleep apnea: Has not used her CPAP in over a year. Her cardiologist manages this. She did feel a little bit better while using it. She wakes mostly well rested. No excessive daytime sleepiness.  GERD: Patient notes fairly significant indigestion with reflux after eating. Particularly worsens with lying down. Feels the food come up at times. Also feels that food gets caught in her lower esophagus at times. No blood in her stool.  Patient notes she is under a significant amount of stress and has a lot of anxiety. Her mother has dementia and she is providing a fairly significant amount of care for her. She notes no depression. No SI or HI. She is interested in going up on the Effexor. blood in her stool.  PMH: nonsmoker.   ROS see history of present illness  Objective  Physical Exam Vitals:   06/03/16 1548  BP: 134/80  Pulse: 92  Temp: 98.5 F (36.9 C)    BP Readings from Last 3 Encounters:  06/03/16 134/80  06/03/16 124/80  01/31/16 114/70   Wt Readings from Last 3 Encounters:  06/03/16 275 lb (124.7 kg)  06/03/16 272 lb 4.8 oz (123.5 kg)  01/31/16 277 lb 9.6 oz (125.9 kg)    Physical Exam  Constitutional: No distress.  HENT:  Head: Normocephalic and atraumatic.  Mouth/Throat: Oropharynx is clear and moist. No oropharyngeal exudate.  Normal TMs bilaterally  Eyes: Conjunctivae are normal. Pupils are equal, round, and reactive to light.    Cardiovascular: Normal rate, regular rhythm and normal heart sounds.   Pulmonary/Chest: Effort normal and breath sounds normal.  Abdominal: Soft. Bowel sounds are normal. She exhibits no distension. There is no tenderness. There is no rebound and no guarding.  Musculoskeletal: She exhibits no edema.  Neurological: She is alert. Gait normal.  Skin: Skin is warm and dry. She is not diaphoretic.     Assessment/Plan: Please see individual problem list.  OSA on CPAP Not using CPAP. She would prefer to discuss this with her cardiologist at her follow-up in the next month or so. Encouraged her to do so.  Diabetes mellitus type 2 in obese Well-controlled. Continue metformin.  Anxiety and depression No depression. Anxiety at this point. Given worsening we will increase her Effexor dose. Monitor with this dose increased.  GERD (gastroesophageal reflux disease) Patient with reflux symptoms. Not on medication. Occasionally feels as though food sticks in her lower esophagus. We'll try her on Protonix. She'll follow-up in a month. If not improving by then we will refer to GI.  URI (upper respiratory infection) Upper respiratory symptoms consistent with viral infection. Encouraged her to continue monitor given that she is improving. If worsen she'll let us know.   Orders Placed This Encounter  Procedures  . POCT HgB A1C    Meds ordered this encounter  Medications  . venlafaxine XR (EFFEXOR-XR) 75 MG 24 hr capsule    Sig: Take 1 capsule (75 mg total) by mouth daily with breakfast.    Dispense:  90 capsule    Refill:  1  . pantoprazole (PROTONIX) 40 MG tablet    Sig: Take 1 tablet (40 mg total) by mouth daily.    Dispense:  30 tablet    Refill:  [place], MD [place]

## 2016-06-03 NOTE — Assessment & Plan Note (Signed)
Upper respiratory symptoms consistent with viral infection. Encouraged her to continue monitor given that she is improving. If worsen she'll let us know.

## 2016-06-03 NOTE — Assessment & Plan Note (Signed)
Patient with reflux symptoms. Not on medication. Occasionally feels as though food sticks in her lower esophagus. We'll try her on Protonix. She'll follow-up in a month. If not improving by then we will refer to GI.

## 2016-06-03 NOTE — Patient Instructions (Signed)
1. No Pap smear needed 2. Mammogram already scheduled for today 3. Stool guaiac testing is deferred due to colonoscopy in 2017 4. Continue with healthy eating and exercise with controlled weight loss 5. Recommend calcium with vitamin D supplementation daily 6. Continue home exercises to help with stress urinary incontinence 7. Return in 1 year for physical 8. Maintain internal medicine/primary care visits for routine non gynecologic health issues .  Health Maintenance for Postmenopausal Women Menopause is a normal process in which your reproductive ability comes to an end. This process happens gradually over a span of months to years, usually between the ages of 42 and 42. Menopause is complete when you have missed 12 consecutive menstrual periods. It is important to talk with your health care provider about some of the most common conditions that affect postmenopausal women, such as heart disease, cancer, and bone loss (osteoporosis). Adopting a healthy lifestyle and getting preventive care can help to promote your health and wellness. Those actions can also lower your chances of developing some of these common conditions. What should I know about menopause? During menopause, you may experience a number of symptoms, such as:  Moderate-to-severe hot flashes.  Night sweats.  Decrease in sex drive.  Mood swings.  Headaches.  Tiredness.  Irritability.  Memory problems.  Insomnia. Choosing to treat or not to treat menopausal changes is an individual decision that you make with your health care provider. What should I know about hormone replacement therapy and supplements? Hormone therapy products are effective for treating symptoms that are associated with menopause, such as hot flashes and night sweats. Hormone replacement carries certain risks, especially as you become older. If you are thinking about using estrogen or estrogen with progestin treatments, discuss the benefits and  risks with your health care provider. What should I know about heart disease and stroke? Heart disease, heart attack, and stroke become more likely as you age. This may be due, in part, to the hormonal changes that your body experiences during menopause. These can affect how your body processes dietary fats, triglycerides, and cholesterol. Heart attack and stroke are both medical emergencies. There are many things that you can do to help prevent heart disease and stroke:  Have your blood pressure checked at least every 1-2 years. High blood pressure causes heart disease and increases the risk of stroke.  If you are 55-37 years old, ask your health care provider if you should take aspirin to prevent a heart attack or a stroke.  Do not use any tobacco products, including cigarettes, chewing tobacco, or electronic cigarettes. If you need help quitting, ask your health care provider.  It is important to eat a healthy diet and maintain a healthy weight.  Be sure to include plenty of vegetables, fruits, low-fat dairy products, and lean protein.  Avoid eating foods that are high in solid fats, added sugars, or salt (sodium).  Get regular exercise. This is one of the most important things that you can do for your health.  Try to exercise for at least 150 minutes each week. The type of exercise that you do should increase your heart rate and make you sweat. This is known as moderate-intensity exercise.  Try to do strengthening exercises at least twice each week. Do these in addition to the moderate-intensity exercise.  Know your numbers.Ask your health care provider to check your cholesterol and your blood glucose. Continue to have your blood tested as directed by your health care provider. What should I know  about cancer screening? There are several types of cancer. Take the following steps to reduce your risk and to catch any cancer development as early as possible. Breast Cancer  Practice  breast self-awareness.  This means understanding how your breasts normally appear and feel.  It also means doing regular breast self-exams. Let your health care provider know about any changes, no matter how small.  If you are 3 or older, have a clinician do a breast exam (clinical breast exam or CBE) every year. Depending on your age, family history, and medical history, it may be recommended that you also have a yearly breast X-ray (mammogram).  If you have a family history of breast cancer, talk with your health care provider about genetic screening.  If you are at high risk for breast cancer, talk with your health care provider about having an MRI and a mammogram every year.  Breast cancer (BRCA) gene test is recommended for women who have family members with BRCA-related cancers. Results of the assessment will determine the need for genetic counseling and BRCA1 and for BRCA2 testing. BRCA-related cancers include these types:  Breast. This occurs in males or females.  Ovarian.  Tubal. This may also be called fallopian tube cancer.  Cancer of the abdominal or pelvic lining (peritoneal cancer).  Prostate.  Pancreatic. Cervical, Uterine, and Ovarian Cancer  Your health care provider may recommend that you be screened regularly for cancer of the pelvic organs. These include your ovaries, uterus, and vagina. This screening involves a pelvic exam, which includes checking for microscopic changes to the surface of your cervix (Pap test).  For women ages 21-65, health care providers may recommend a pelvic exam and a Pap test every three years. For women ages 92-65, they may recommend the Pap test and pelvic exam, combined with testing for human papilloma virus (HPV), every five years. Some types of HPV increase your risk of cervical cancer. Testing for HPV may also be done on women of any age who have unclear Pap test results.  Other health care providers may not recommend any screening for  nonpregnant women who are considered low risk for pelvic cancer and have no symptoms. Ask your health care provider if a screening pelvic exam is right for you.  If you have had past treatment for cervical cancer or a condition that could lead to cancer, you need Pap tests and screening for cancer for at least 20 years after your treatment. If Pap tests have been discontinued for you, your risk factors (such as having a new sexual partner) need to be reassessed to determine if you should start having screenings again. Some women have medical problems that increase the chance of getting cervical cancer. In these cases, your health care provider may recommend that you have screening and Pap tests more often.  If you have a family history of uterine cancer or ovarian cancer, talk with your health care provider about genetic screening.  If you have vaginal bleeding after reaching menopause, tell your health care provider.  There are currently no reliable tests available to screen for ovarian cancer. Lung Cancer  Lung cancer screening is recommended for adults 75-8 years old who are at high risk for lung cancer because of a history of smoking. A yearly low-dose CT scan of the lungs is recommended if you:  Currently smoke.  Have a history of at least 30 pack-years of smoking and you currently smoke or have quit within the past 15 years. A pack-year  is smoking an average of one pack of cigarettes per day for one year. Yearly screening should:  Continue until it has been 15 years since you quit.  Stop if you develop a health problem that would prevent you from having lung cancer treatment. Colorectal Cancer  This type of cancer can be detected and can often be prevented.  Routine colorectal cancer screening usually begins at age 18 and continues through age 4.  If you have risk factors for colon cancer, your health care provider may recommend that you be screened at an earlier age.  If you have  a family history of colorectal cancer, talk with your health care provider about genetic screening.  Your health care provider may also recommend using home test kits to check for hidden blood in your stool.  A small camera at the end of a tube can be used to examine your colon directly (sigmoidoscopy or colonoscopy). This is done to check for the earliest forms of colorectal cancer.  Direct examination of the colon should be repeated every 5-10 years until age 52. However, if early forms of precancerous polyps or small growths are found or if you have a family history or genetic risk for colorectal cancer, you may need to be screened more often. Skin Cancer  Check your skin from head to toe regularly.  Monitor any moles. Be sure to tell your health care provider:  About any new moles or changes in moles, especially if there is a change in a mole's shape or color.  If you have a mole that is larger than the size of a pencil eraser.  If any of your family members has a history of skin cancer, especially at a young age, talk with your health care provider about genetic screening.  Always use sunscreen. Apply sunscreen liberally and repeatedly throughout the day.  Whenever you are outside, protect yourself by wearing long sleeves, pants, a wide-brimmed hat, and sunglasses. What should I know about osteoporosis? Osteoporosis is a condition in which bone destruction happens more quickly than new bone creation. After menopause, you may be at an increased risk for osteoporosis. To help prevent osteoporosis or the bone fractures that can happen because of osteoporosis, the following is recommended:  If you are 44-66 years old, get at least 1,000 mg of calcium and at least 600 mg of vitamin D per day.  If you are older than age 68 but younger than age 15, get at least 1,200 mg of calcium and at least 600 mg of vitamin D per day.  If you are older than age 90, get at least 1,200 mg of calcium and  at least 800 mg of vitamin D per day. Smoking and excessive alcohol intake increase the risk of osteoporosis. Eat foods that are rich in calcium and vitamin D, and do weight-bearing exercises several times each week as directed by your health care provider. What should I know about how menopause affects my mental health? Depression may occur at any age, but it is more common as you become older. Common symptoms of depression include:  Low or sad mood.  Changes in sleep patterns.  Changes in appetite or eating patterns.  Feeling an overall lack of motivation or enjoyment of activities that you previously enjoyed.  Frequent crying spells. Talk with your health care provider if you think that you are experiencing depression. What should I know about immunizations? It is important that you get and maintain your immunizations. These include:  Tetanus, diphtheria, and pertussis (Tdap) booster vaccine.  Influenza every year before the flu season begins.  Pneumonia vaccine.  Shingles vaccine. Your health care provider may also recommend other immunizations. This information is not intended to replace advice given to you by your health care provider. Make sure you discuss any questions you have with your health care provider. Document Released: 04/25/2005 Document Revised: 09/21/2015 Document Reviewed: 12/05/2014 Elsevier Interactive Patient Education  2017 [employer].

## 2016-06-03 NOTE — Patient Instructions (Addendum)
Nice to see you. We are going to check your A1c today. We will increase your Effexor. If you develop worsening anxiety please let us know. We are going to start you on Protonix for your reflux. If your symptoms do not improve in the next several weeks please let us know.

## 2016-06-03 NOTE — Assessment & Plan Note (Signed)
>>  ASSESSMENT AND PLAN FOR ANXIETY AND DEPRESSION WRITTEN ON 06/03/2016  4:51 PM BY SONNENBERG, ERIC G, MD  No depression. Anxiety at this point. Given worsening we will increase her Effexor  dose. Monitor with this dose increased.

## 2016-06-03 NOTE — Assessment & Plan Note (Signed)
Well controlled. Continue metformin.

## 2016-06-03 NOTE — Assessment & Plan Note (Signed)
Not using CPAP. She would prefer to discuss this with her cardiologist at her follow-up in the next month or so. Encouraged her to do so.

## 2016-06-03 NOTE — Assessment & Plan Note (Signed)
No depression. Anxiety at this point. Given worsening we will increase her Effexor dose. Monitor with this dose increased.

## 2016-06-03 NOTE — Progress Notes (Signed)
Pre visit review using our clinic review tool, if applicable. No additional management support is needed unless otherwise documented below in the visit note. 

## 2016-06-05 ENCOUNTER — Encounter: Payer: Self-pay | Admitting: Family Medicine

## 2016-06-23 ENCOUNTER — Other Ambulatory Visit: Payer: Self-pay | Admitting: Family Medicine

## 2016-07-04 ENCOUNTER — Encounter: Payer: Self-pay | Admitting: Family Medicine

## 2016-07-04 ENCOUNTER — Ambulatory Visit (INDEPENDENT_AMBULATORY_CARE_PROVIDER_SITE_OTHER): Payer: BLUE CROSS/BLUE SHIELD | Admitting: Family Medicine

## 2016-07-04 DIAGNOSIS — F32A Depression, unspecified: Secondary | ICD-10-CM

## 2016-07-04 DIAGNOSIS — F329 Major depressive disorder, single episode, unspecified: Secondary | ICD-10-CM

## 2016-07-04 DIAGNOSIS — K219 Gastro-esophageal reflux disease without esophagitis: Secondary | ICD-10-CM | POA: Diagnosis not present

## 2016-07-04 DIAGNOSIS — F419 Anxiety disorder, unspecified: Secondary | ICD-10-CM

## 2016-07-04 NOTE — Assessment & Plan Note (Signed)
Currently asymptomatic. The Protonix worked very well for her. Discussed that we should try discontinuing this and see if her symptoms recur. If they do recur at any point she will contact us and we will refer to GI for consideration of EGD. She can take Tums and refill the Protonix if her symptoms do recur.

## 2016-07-04 NOTE — Progress Notes (Signed)
Pre visit review using our clinic review tool, if applicable. No additional management support is needed unless otherwise documented below in the visit note. 

## 2016-07-04 NOTE — Progress Notes (Signed)
  Tommi Rumps, MD Phone: [phone]  Shelby Estes is a 61 y.o. female who presents today for follow-up.  Patient notes her anxiety is significant better. She notes no anxiety at this time. The increased Effexor dose has been helpful. No depression. Notes they've gotten some help with her mother so that she does not have to sit with her all the time.  GERD: She's been taking Protonix. She notes no reflux symptoms. She's had no sensation that food is sticking. No blood in her stool. No abdominal pain.  Obesity: She has been trying to eat better. Has cut down on the amount of bread she is eating. Does not exercise currently.  PMH: nonsmoker.   ROS see history of present illness  Objective  Physical Exam Vitals:   07/04/16 1030  BP: 128/80  Pulse: 77  Temp: 97.8 F (36.6 C)    BP Readings from Last 3 Encounters:  07/04/16 128/80  06/03/16 134/80  06/03/16 124/80   Wt Readings from Last 3 Encounters:  07/04/16 268 lb 12.8 oz (121.9 kg)  06/03/16 275 lb (124.7 kg)  06/03/16 272 lb 4.8 oz (123.5 kg)    Physical Exam  Constitutional: No distress.  Cardiovascular: Normal rate, regular rhythm and normal heart sounds.   Pulmonary/Chest: Effort normal and breath sounds normal.  Musculoskeletal: She exhibits no edema.  Neurological: She is alert. Gait normal.  Skin: Skin is warm and dry. She is not diaphoretic.     Assessment/Plan: Please see individual problem list.  GERD (gastroesophageal reflux disease) Currently asymptomatic. The Protonix worked very well for her. Discussed that we should try discontinuing this and see if her symptoms recur. If they do recur at any point she will contact us and we will refer to GI for consideration of EGD. She can take Tums and refill the Protonix if her symptoms do recur.  Anxiety and depression Significantly improved at this time. Suspect this is a combination of her getting help with her mother and the increased dose of  Effexor. She'll continue to monitor.  Obesity, Class III, BMI 40-49.9 (morbid obesity) Congratulated on weight loss. Encouraged continued dietary changes. Encouraged her to add low impact exercise as well.   Tommi Rumps, MD [place]

## 2016-07-04 NOTE — Patient Instructions (Signed)
Nice to see you. Please discontinue the Protonix. If you have recurrence of your reflux or feeling as though food is sticking please contact us and let us know so that we can get you to see GI. If this occurs you can always take Tums and refill your Protonix. Please try to incorporate some low impact exercise. Please continue to work on your diet.

## 2016-07-04 NOTE — Assessment & Plan Note (Signed)
Significantly improved at this time. Suspect this is a combination of her getting help with her mother and the increased dose of Effexor. She'll continue to monitor.

## 2016-07-04 NOTE — Assessment & Plan Note (Addendum)
Congratulated on weight loss. Encouraged continued dietary changes. Encouraged her to add low impact exercise as well.

## 2016-07-04 NOTE — Assessment & Plan Note (Signed)
>>  ASSESSMENT AND PLAN FOR ANXIETY AND DEPRESSION WRITTEN ON 07/04/2016 10:59 AM BY SONNENBERG, ERIC G, MD  Significantly improved at this time. Suspect this is a combination of her getting help with her mother and the increased dose of Effexor . She'll continue to monitor.

## 2016-11-08 ENCOUNTER — Other Ambulatory Visit: Payer: Self-pay | Admitting: Family Medicine

## 2016-11-27 ENCOUNTER — Other Ambulatory Visit: Payer: Self-pay | Admitting: Family Medicine

## 2016-12-04 ENCOUNTER — Ambulatory Visit: Payer: BLUE CROSS/BLUE SHIELD | Admitting: Family Medicine

## 2017-03-11 ENCOUNTER — Other Ambulatory Visit: Payer: Self-pay | Admitting: Family Medicine

## 2017-05-11 ENCOUNTER — Other Ambulatory Visit: Payer: Self-pay | Admitting: Family Medicine

## 2017-06-04 ENCOUNTER — Encounter: Payer: BLUE CROSS/BLUE SHIELD | Admitting: Obstetrics and Gynecology

## 2017-06-04 NOTE — Progress Notes (Deleted)
ANNUAL PREVENTATIVE CARE GYN  ENCOUNTER NOTE  Subjective:       Shelby Estes is a 62 y.o. G0P0000 female here for a routine annual gynecologic exam.  Current complaints: 1.      Gynecologic History No LMP recorded. Patient has had a hysterectomy. Contraception: status post hysterectomy robotic hysterectomy, BSO (Dr. Claiborne Rigg) Last Pap: 2013. Results were: normal Last mammogram: 06/03/2016 birad 1 bibc. Results were: wnl  Obstetric History OB History  Gravida Para Term Preterm AB Living  0 0 0 0 0 0  SAB TAB Ectopic Multiple Live Births  0 0 0 0      Past Medical History:  Diagnosis Date  . Anxiety   . Depression   . Diabetes mellitus 2010  . Elevated BP   . GERD (gastroesophageal reflux disease)   . Kidney stone    Dr. Cyndie Mull, April 2014  . Renal cyst    Dr. Cyndie Mull  . SVT (supraventricular tachycardia) [facility])    s/p cardioversion  . Vitamin D deficiency     Past Surgical History:  Procedure Laterality Date  . ABDOMINAL HYSTERECTOMY  09/27/13   Dr. Veryl Speak  . Arm Surgery     due to fracture left elbow  . CHOLECYSTECTOMY  5/07   Dr Bary Castilla  . COLONOSCOPY  2007   Dr Bary Castilla  . COLONOSCOPY WITH PROPOFOL N/A 06/20/2015   Procedure: COLONOSCOPY WITH PROPOFOL;  Surgeon: [name], MD;  Location: [facility] ENDOSCOPY;  Service: Endoscopy;  Laterality: N/A;  . TUBAL LIGATION  02-21-01    Current Outpatient Medications on File Prior to Visit  Medication Sig Dispense Refill  . Cholecalciferol (VITAMIN D) 2000 UNITS CAPS Take 1 capsule by mouth daily.    . fluticasone (FLONASE) 50 MCG/ACT nasal spray Place 2 sprays into both nostrils daily. 16 g 6  . furosemide (LASIX) 20 MG tablet     . losartan (COZAAR) 25 MG tablet     . meloxicam (MOBIC) 15 MG tablet Take by mouth.    . metFORMIN (GLUCOPHAGE) 500 MG tablet Take 1 tablet (500 mg total) by mouth 2 (two) times daily with a meal. PLEASE CALL OFFICE SOON FOR AN APPOINTMENT 180 tablet  0  . metoprolol tartrate (LOPRESSOR) 25 MG tablet Take 1 tablet (25 mg total) by mouth 2 (two) times daily. 60 tablet 6  . pantoprazole (PROTONIX) 40 MG tablet Take 1 tablet (40 mg total) by mouth daily. 30 tablet 3  . venlafaxine XR (EFFEXOR-XR) 75 MG 24 hr capsule TAKE 1 CAPSULE BY MOUTH EVERY DAY WITH BREAKFAST 90 capsule 1   No current facility-administered medications on file prior to visit.     Allergies  Allergen Reactions  . Bupropion     Muscle aches   . Clarithromycin   . Enalapril     edema  . Phentermine     SVT  . Ceclor [Cefaclor] Swelling and Rash  . Penicillins Swelling and Rash  . Sulfa Antibiotics Swelling and Rash    Social History   Socioeconomic History  . Marital status: Married    Spouse name: Not on file  . Number of children: 0  . Years of education: Not on file  . Highest education level: Not on file  Occupational History  . Occupation: Agricultural engineer: [employer]  Social Needs  . Financial resource strain: Not on file  . Food insecurity:    Worry: Not on file  Inability: Not on file  . Transportation needs:    Medical: Not on file    Non-medical: Not on file  Tobacco Use  . Smoking status: Never Smoker  . Smokeless tobacco: Never Used  Substance and Sexual Activity  . Alcohol use: No  . Drug use: No  . Sexual activity: Yes    Birth control/protection: Surgical  Lifestyle  . Physical activity:    Days per week: Not on file    Minutes per session: Not on file  . Stress: Not on file  Relationships  . Social connections:    Talks on phone: Not on file    Gets together: Not on file    Attends religious service: Not on file    Active member of club or organization: Not on file    Attends meetings of clubs or organizations: Not on file    Relationship status: Not on file  . Intimate partner violence:    Fear of current or ex partner: Not on file    Emotionally abused: Not on file    Physically  abused: Not on file    Forced sexual activity: Not on file  Other Topics Concern  . Not on file  Social History Narrative   Lives with husband, dog. 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 Hobbies - Quilting, gardening      Regular Exercise -  NO   Daily Caffeine Use:  1-2 cups coffee, 1-2 soda/tea                Family History  Problem Relation Age of Onset  . COPD Father   . Stroke Father   . Diabetes Mother        borderline  . Hypertension Mother   . Thyroid disease Mother   . Breast cancer Maternal Aunt   . Seizures Maternal Grandmother   . Breast cancer Other   . Cancer Other        breast  . Colon cancer Sister        38  . Colon cancer Cousin        42's    The following portions of the patient's history were reviewed and updated as appropriate: allergies, current medications, past family history, past medical history, past social history, past surgical history and problem list.  Review of Systems    Objective:   There were no vitals taken for this visit. CONSTITUTIONAL: Well-developed, well-nourished female in no acute distress.  PSYCHIATRIC: Normal mood and affect. Normal behavior. Normal judgment and thought content. [place]: Alert and oriented to person, place, and time. Normal muscle tone coordination. No cranial nerve deficit noted. HENT:  Normocephalic, atraumatic, External right and left ear normal. Oropharynx is clear and moist EYES: Conjunctivae and EOM are normal. Pupils are equal, round, and reactive to light. No scleral icterus.  NECK: Normal range of motion, supple, no masses.  Normal thyroid.  SKIN: Skin is warm and dry. No rash noted. Not diaphoretic. No erythema. No pallor. CARDIOVASCULAR: Normal heart rate noted, regular rhythm, no murmur. RESPIRATORY: Clear to auscultation bilaterally. Effort and breath sounds normal, no problems with respiration noted. BREASTS: Symmetric in size. No masses, skin changes, nipple  drainage, or lymphadenopathy. ABDOMEN: Soft, normal bowel sounds, no distention noted.  No tenderness, rebound or guarding.  BLADDER: Normal PELVIC:  External Genitalia: Normal  BUS: Normal  Vagina: Mild atrophy  Cervix: Absent due to hysterectomy - intact vaginal cuff  Uterus: Absent  Adnexa: Absent  RV:  External Exam NormaI, No Rectal Masses and Normal Sphincter tone  MUSCULOSKELETAL: Normal range of motion. No tenderness.  No cyanosis, clubbing, or edema.  2+ distal pulses. LYMPHATIC: No Axillary, Supraclavicular, or Inguinal Adenopathy.    Assessment:   Annual gynecologic examination 62 y.o. Contraception: status post hysterectomy bmi- 46 Vaginal atrophy  Plan:  Pap: no further paps needed Mammogram ;  Stool Guaiac Testing: ordered Routine preventative health maintenance measures emphasized: Exercise/Diet/Weight control and Stress Management  Stress urinary incontinence: recommended regular kegal exercises.  Return to [place]   Note: This dictation was prepared with Dragon dictation along with smaller phrase technology. Any transcriptional errors that result from this process are unintentional.

## 2017-06-09 ENCOUNTER — Encounter: Payer: BLUE CROSS/BLUE SHIELD | Admitting: Obstetrics and Gynecology

## 2017-06-11 NOTE — Progress Notes (Signed)
ANNUAL PREVENTATIVE CARE GYN  ENCOUNTER NOTE  Subjective:       Shelby Estes is a 62 y.o. G0P0000 female here for a routine annual gynecologic exam.  Current complaints: 1.  None  Patient reports left knee problems which may require knee replacement.  Exercise is limited because of the knee dysfunction. Patient reports occasional constipation that responds to Dulcolax.  Last colonoscopy was in 2017; 1 polyp was noted.  Sister and aunt have died of colon cancer. Bladder function is notable for some mild stress incontinence. Vaginal dryness is not a significant issue; she does use lubricants for intercourse Patient is taking vitamin D supplementation; she has not been taking calcium.  Gynecologic History No LMP recorded. Patient has had a hysterectomy. Contraception: status post hysterectomy robotic hysterectomy, BSO (Dr. Claiborne Rigg) Last Pap: 2013. Results were: normal Last mammogram: 06/03/2016 birad 1 bibc. Results were: wnl History of adenomyosis, leiomyoma uteri, and atypical endometrial hyperplasia  Obstetric History OB History  Gravida Para Term Preterm AB Living  0 0 0 0 0 0  SAB TAB Ectopic Multiple Live Births  0 0 0 0      Past Medical History:  Diagnosis Date  . Anxiety   . Depression   . Diabetes mellitus 2010  . Elevated BP   . GERD (gastroesophageal reflux disease)   . Kidney stone    Dr. Cyndie Mull, April 2014  . Renal cyst    Dr. Cyndie Mull  . SVT (supraventricular tachycardia) [facility])    s/p cardioversion  . Vitamin D deficiency     Past Surgical History:  Procedure Laterality Date  . ABDOMINAL HYSTERECTOMY  09/27/13   Dr. Veryl Speak  . Arm Surgery     due to fracture left elbow  . CHOLECYSTECTOMY  5/07   Dr Bary Castilla  . COLONOSCOPY  2007   Dr Bary Castilla  . COLONOSCOPY WITH PROPOFOL N/A 06/20/2015   Procedure: COLONOSCOPY WITH PROPOFOL;  Surgeon: [name], MD;  Location: [facility] ENDOSCOPY;  Service: Endoscopy;  Laterality:  N/A;  . TUBAL LIGATION  02-21-01    Current Outpatient Medications on File Prior to Visit  Medication Sig Dispense Refill  . Cholecalciferol (VITAMIN D) 2000 UNITS CAPS Take 1 capsule by mouth daily.    . fluticasone (FLONASE) 50 MCG/ACT nasal spray Place 2 sprays into both nostrils daily. 16 g 6  . furosemide (LASIX) 20 MG tablet     . losartan (COZAAR) 25 MG tablet     . meloxicam (MOBIC) 15 MG tablet Take by mouth.    . metFORMIN (GLUCOPHAGE) 500 MG tablet Take 1 tablet (500 mg total) by mouth 2 (two) times daily with a meal. PLEASE CALL OFFICE SOON FOR AN APPOINTMENT 180 tablet 0  . metoprolol tartrate (LOPRESSOR) 25 MG tablet Take 1 tablet (25 mg total) by mouth 2 (two) times daily. 60 tablet 6  . pantoprazole (PROTONIX) 40 MG tablet Take 1 tablet (40 mg total) by mouth daily. 30 tablet 3  . venlafaxine XR (EFFEXOR-XR) 75 MG 24 hr capsule TAKE 1 CAPSULE BY MOUTH EVERY DAY WITH BREAKFAST 90 capsule 1   No current facility-administered medications on file prior to visit.     Allergies  Allergen Reactions  . Bupropion     Muscle aches   . Clarithromycin   . Enalapril     edema  . Phentermine     SVT  . Ceclor [Cefaclor] Swelling and Rash  . Penicillins Swelling and Rash  . Sulfa Antibiotics Swelling  and Rash    Social History   Socioeconomic History  . Marital status: Married    Spouse name: Not on file  . Number of children: 0  . Years of education: Not on file  . Highest education level: Not on file  Occupational History  . Occupation: Agricultural engineer: [employer]  Social Needs  . Financial resource strain: Not on file  . Food insecurity:    Worry: Not on file    Inability: Not on file  . Transportation needs:    Medical: Not on file    Non-medical: Not on file  Tobacco Use  . Smoking status: Never Smoker  . Smokeless tobacco: Never Used  Substance and Sexual Activity  . Alcohol use: No  . Drug use: No  . Sexual  activity: Yes    Birth control/protection: Surgical  Lifestyle  . Physical activity:    Days per week: Not on file    Minutes per session: Not on file  . Stress: Not on file  Relationships  . Social connections:    Talks on phone: Not on file    Gets together: Not on file    Attends religious service: Not on file    Active member of club or organization: Not on file    Attends meetings of clubs or organizations: Not on file    Relationship status: Not on file  . Intimate partner violence:    Fear of current or ex partner: Not on file    Emotionally abused: Not on file    Physically abused: Not on file    Forced sexual activity: Not on file  Other Topics Concern  . Not on file  Social History Narrative   Lives with husband, dog. 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 Hobbies - Quilting, gardening      Regular Exercise -  NO   Daily Caffeine Use:  1-2 cups coffee, 1-2 soda/tea                Family History  Problem Relation Age of Onset  . COPD Father   . Stroke Father   . Diabetes Mother        borderline  . Hypertension Mother   . Thyroid disease Mother   . Breast cancer Maternal Aunt   . Seizures Maternal Grandmother   . Breast cancer Other   . Cancer Other        breast  . Colon cancer Sister        50  . Colon cancer Cousin        60's    The following portions of the patient's history were reviewed and updated as appropriate: allergies, current medications, past family history, past medical history, past social history, past surgical history and problem list.  Review of Systems Review of Systems  Constitutional:       States hot continuously  HENT: Negative.   Eyes: Negative.   Respiratory: Negative.   Cardiovascular: Negative.   Gastrointestinal: Positive for constipation.  Genitourinary:       Mild stress incontinence, stable  Musculoskeletal: Positive for joint pain.       Left knee pain; patient may need knee replacement   Skin: Negative.   Neurological: Negative.   Endo/Heme/Allergies: Negative.   Psychiatric/Behavioral: Negative.       Objective:   BP (!) 149/88   Pulse 82   Ht 5\' 4"  (1.626 m)  Wt 276 lb 8 oz (125.4 kg)   BMI 47.46 kg/m  CONSTITUTIONAL: Well-developed, well-nourished female in no acute distress.  PSYCHIATRIC: Normal mood and affect. Normal behavior. Normal judgment and thought content. [place]: Alert and oriented to person, place, and time. Normal muscle tone coordination. No cranial nerve deficit noted. HENT:  Normocephalic, atraumatic, External right and left ear normal. Oropharynx is clear and moist EYES: Conjunctivae and EOM are normal. No scleral icterus.  NECK: Normal range of motion, supple, no masses.  Normal thyroid.  SKIN: Skin is warm and dry. No rash noted. Not diaphoretic. No erythema. No pallor. CARDIOVASCULAR: Normal heart rate noted, regular rhythm, no murmur. RESPIRATORY: Clear to auscultation bilaterally. Effort and breath sounds normal, no problems with respiration noted. BREASTS: Symmetric in size. No masses, skin changes, nipple drainage, or lymphadenopathy. ABDOMEN: Soft, normal bowel sounds, no distention noted.  No tenderness, rebound or guarding.  BLADDER: Normal PELVIC:  External Genitalia: Normal  BUS: Normal  Vagina: Mild atrophy  Cervix: Absent due to hysterectomy - intact vaginal cuff; no masses or tenderness  Uterus: Surgically absent  Adnexa: Surgically absent  RV: External Exam NormaI, No Rectal Masses and Normal Sphincter tone  MUSCULOSKELETAL: Normal range of motion. No tenderness.  No cyanosis, clubbing, or edema.  2+ distal pulses. LYMPHATIC: No Axillary, Supraclavicular, or Inguinal Adenopathy.    Assessment:   Annual gynecologic examination 62 y.o. Contraception: status post hysterectomy robotic hysterectomy BSO bmi- 47 Vaginal atrophy  Plan:  Pap: no further paps needed Mammogram ; scheduled for today  Stool Guaiac  Testing: ordered Routine preventative health maintenance measures emphasized: Exercise/Diet/Weight control and Stress Management  Stress urinary incontinence: recommended regular kegal exercises.  Calcium 200 mg a day is recommended along with her vitamin D supplementation Return to [place], [place]  Brayton Mars, MD   Note: This dictation was prepared with Dragon dictation along with smaller phrase technology. Any transcriptional errors that result from this process are unintentional.

## 2017-06-17 ENCOUNTER — Encounter: Payer: Self-pay | Admitting: Obstetrics and Gynecology

## 2017-06-17 ENCOUNTER — Telehealth: Payer: Self-pay

## 2017-06-17 ENCOUNTER — Ambulatory Visit (INDEPENDENT_AMBULATORY_CARE_PROVIDER_SITE_OTHER): Payer: BLUE CROSS/BLUE SHIELD | Admitting: Obstetrics and Gynecology

## 2017-06-17 VITALS — BP 149/88 | HR 82 | Ht 64.0 in | Wt 276.5 lb

## 2017-06-17 DIAGNOSIS — Z1211 Encounter for screening for malignant neoplasm of colon: Secondary | ICD-10-CM | POA: Diagnosis not present

## 2017-06-17 DIAGNOSIS — N952 Postmenopausal atrophic vaginitis: Secondary | ICD-10-CM

## 2017-06-17 DIAGNOSIS — E1169 Type 2 diabetes mellitus with other specified complication: Secondary | ICD-10-CM

## 2017-06-17 DIAGNOSIS — N393 Stress incontinence (female) (male): Secondary | ICD-10-CM

## 2017-06-17 DIAGNOSIS — Z9079 Acquired absence of other genital organ(s): Secondary | ICD-10-CM | POA: Diagnosis not present

## 2017-06-17 DIAGNOSIS — Z130 Encounter for screening for diseases of the blood and blood-forming organs and certain disorders involving the immune mechanism: Secondary | ICD-10-CM

## 2017-06-17 DIAGNOSIS — Z800 Family history of malignant neoplasm of digestive organs: Secondary | ICD-10-CM | POA: Diagnosis not present

## 2017-06-17 DIAGNOSIS — Z90722 Acquired absence of ovaries, bilateral: Secondary | ICD-10-CM | POA: Insufficient documentation

## 2017-06-17 DIAGNOSIS — E669 Obesity, unspecified: Principal | ICD-10-CM

## 2017-06-17 DIAGNOSIS — E66813 Obesity, class 3: Secondary | ICD-10-CM

## 2017-06-17 DIAGNOSIS — Z780 Asymptomatic menopausal state: Secondary | ICD-10-CM | POA: Diagnosis not present

## 2017-06-17 DIAGNOSIS — Z1159 Encounter for screening for other viral diseases: Secondary | ICD-10-CM

## 2017-06-17 DIAGNOSIS — Z01419 Encounter for gynecological examination (general) (routine) without abnormal findings: Secondary | ICD-10-CM

## 2017-06-17 DIAGNOSIS — Z90710 Acquired absence of both cervix and uterus: Secondary | ICD-10-CM | POA: Diagnosis not present

## 2017-06-17 DIAGNOSIS — Z1329 Encounter for screening for other suspected endocrine disorder: Secondary | ICD-10-CM

## 2017-06-17 DIAGNOSIS — Z13220 Encounter for screening for lipoid disorders: Secondary | ICD-10-CM

## 2017-06-17 HISTORY — DX: Acquired absence of both cervix and uterus: Z90.79

## 2017-06-17 HISTORY — DX: Acquired absence of other genital organ(s): Z90.722

## 2017-06-17 LAB — HM MAMMOGRAPHY

## 2017-06-17 NOTE — Telephone Encounter (Signed)
Please advise 

## 2017-06-17 NOTE — Patient Instructions (Addendum)
1.  No Pap smear done.  No further Pap smears are needed 2.  Mammogram is ordered 3.  Stool guaiac card testing for colon cancer screening is ordered 4.  Continue with healthy eating and exercise with controlled weight loss 5.  Recommend calcium 1200 mg a day and vitamin D 800 international units a day.  Sources of calcium are Citracal, Os-Cal, Tums, Viactiv chews 6.  Return in 1 year for annual exam   Health Maintenance for Postmenopausal Women Menopause is a normal process in which your reproductive ability comes to an end. This process happens gradually over a span of months to years, usually between the ages of 39 and 19. Menopause is complete when you have missed 12 consecutive menstrual periods. It is important to talk with your health care provider about some of the most common conditions that affect postmenopausal women, such as heart disease, cancer, and bone loss (osteoporosis). Adopting a healthy lifestyle and getting preventive care can help to promote your health and wellness. Those actions can also lower your chances of developing some of these common conditions. What should I know about menopause? During menopause, you may experience a number of symptoms, such as:  Moderate-to-severe hot flashes.  Night sweats.  Decrease in sex drive.  Mood swings.  Headaches.  Tiredness.  Irritability.  Memory problems.  Insomnia.  Choosing to treat or not to treat menopausal changes is an individual decision that you make with your health care provider. What should I know about hormone replacement therapy and supplements? Hormone therapy products are effective for treating symptoms that are associated with menopause, such as hot flashes and night sweats. Hormone replacement carries certain risks, especially as you become older. If you are thinking about using estrogen or estrogen with progestin treatments, discuss the benefits and risks with your health care provider. What should I  know about heart disease and stroke? Heart disease, heart attack, and stroke become more likely as you age. This may be due, in part, to the hormonal changes that your body experiences during menopause. These can affect how your body processes dietary fats, triglycerides, and cholesterol. Heart attack and stroke are both medical emergencies. There are many things that you can do to help prevent heart disease and stroke:  Have your blood pressure checked at least every 1-2 years. High blood pressure causes heart disease and increases the risk of stroke.  If you are 47-85 years old, ask your health care provider if you should take aspirin to prevent a heart attack or a stroke.  Do not use any tobacco products, including cigarettes, chewing tobacco, or electronic cigarettes. If you need help quitting, ask your health care provider.  It is important to eat a healthy diet and maintain a healthy weight. ? Be sure to include plenty of vegetables, fruits, low-fat dairy products, and lean protein. ? Avoid eating foods that are high in solid fats, added sugars, or salt (sodium).  Get regular exercise. This is one of the most important things that you can do for your health. ? Try to exercise for at least 150 minutes each week. The type of exercise that you do should increase your heart rate and make you sweat. This is known as moderate-intensity exercise. ? Try to do strengthening exercises at least twice each week. Do these in addition to the moderate-intensity exercise.  Know your numbers.Ask your health care provider to check your cholesterol and your blood glucose. Continue to have your blood tested as directed by  your health care provider.  What should I know about cancer screening? There are several types of cancer. Take the following steps to reduce your risk and to catch any cancer development as early as possible. Breast Cancer  Practice breast self-awareness. ? This means understanding how  your breasts normally appear and feel. ? It also means doing regular breast self-exams. Let your health care provider know about any changes, no matter how small.  If you are 13 or older, have a clinician do a breast exam (clinical breast exam or CBE) every year. Depending on your age, family history, and medical history, it may be recommended that you also have a yearly breast X-ray (mammogram).  If you have a family history of breast cancer, talk with your health care provider about genetic screening.  If you are at high risk for breast cancer, talk with your health care provider about having an MRI and a mammogram every year.  Breast cancer (BRCA) gene test is recommended for women who have family members with BRCA-related cancers. Results of the assessment will determine the need for genetic counseling and BRCA1 and for BRCA2 testing. BRCA-related cancers include these types: ? Breast. This occurs in males or females. ? Ovarian. ? Tubal. This may also be called fallopian tube cancer. ? Cancer of the abdominal or pelvic lining (peritoneal cancer). ? Prostate. ? Pancreatic.  Cervical, Uterine, and Ovarian Cancer Your health care provider may recommend that you be screened regularly for cancer of the pelvic organs. These include your ovaries, uterus, and vagina. This screening involves a pelvic exam, which includes checking for microscopic changes to the surface of your cervix (Pap test).  For women ages 21-65, health care providers may recommend a pelvic exam and a Pap test every three years. For women ages 36-65, they may recommend the Pap test and pelvic exam, combined with testing for human papilloma virus (HPV), every five years. Some types of HPV increase your risk of cervical cancer. Testing for HPV may also be done on women of any age who have unclear Pap test results.  Other health care providers may not recommend any screening for nonpregnant women who are considered low risk for  pelvic cancer and have no symptoms. Ask your health care provider if a screening pelvic exam is right for you.  If you have had past treatment for cervical cancer or a condition that could lead to cancer, you need Pap tests and screening for cancer for at least 20 years after your treatment. If Pap tests have been discontinued for you, your risk factors (such as having a new sexual partner) need to be reassessed to determine if you should start having screenings again. Some women have medical problems that increase the chance of getting cervical cancer. In these cases, your health care provider may recommend that you have screening and Pap tests more often.  If you have a family history of uterine cancer or ovarian cancer, talk with your health care provider about genetic screening.  If you have vaginal bleeding after reaching menopause, tell your health care provider.  There are currently no reliable tests available to screen for ovarian cancer.  Lung Cancer Lung cancer screening is recommended for adults 57-44 years old who are at high risk for lung cancer because of a history of smoking. A yearly low-dose CT scan of the lungs is recommended if you:  Currently smoke.  Have a history of at least 30 pack-years of smoking and you currently smoke or  have quit within the past 15 years. A pack-year is smoking an average of one pack of cigarettes per day for one year.  Yearly screening should:  Continue until it has been 15 years since you quit.  Stop if you develop a health problem that would prevent you from having lung cancer treatment.  Colorectal Cancer  This type of cancer can be detected and can often be prevented.  Routine colorectal cancer screening usually begins at age 28 and continues through age 79.  If you have risk factors for colon cancer, your health care provider may recommend that you be screened at an earlier age.  If you have a family history of colorectal cancer, talk  with your health care provider about genetic screening.  Your health care provider may also recommend using home test kits to check for hidden blood in your stool.  A small camera at the end of a tube can be used to examine your colon directly (sigmoidoscopy or colonoscopy). This is done to check for the earliest forms of colorectal cancer.  Direct examination of the colon should be repeated every 5-10 years until age 17. However, if early forms of precancerous polyps or small growths are found or if you have a family history or genetic risk for colorectal cancer, you may need to be screened more often.  Skin Cancer  Check your skin from head to toe regularly.  Monitor any moles. Be sure to tell your health care provider: ? About any new moles or changes in moles, especially if there is a change in a mole's shape or color. ? If you have a mole that is larger than the size of a pencil eraser.  If any of your family members has a history of skin cancer, especially at a young age, talk with your health care provider about genetic screening.  Always use sunscreen. Apply sunscreen liberally and repeatedly throughout the day.  Whenever you are outside, protect yourself by wearing long sleeves, pants, a wide-brimmed hat, and sunglasses.  What should I know about osteoporosis? Osteoporosis is a condition in which bone destruction happens more quickly than new bone creation. After menopause, you may be at an increased risk for osteoporosis. To help prevent osteoporosis or the bone fractures that can happen because of osteoporosis, the following is recommended:  If you are 68-59 years old, get at least 1,000 mg of calcium and at least 600 mg of vitamin D per day.  If you are older than age 56 but younger than age 46, get at least 1,200 mg of calcium and at least 600 mg of vitamin D per day.  If you are older than age 19, get at least 1,200 mg of calcium and at least 800 mg of vitamin D per  day.  Smoking and excessive alcohol intake increase the risk of osteoporosis. Eat foods that are rich in calcium and vitamin D, and do weight-bearing exercises several times each week as directed by your health care provider. What should I know about how menopause affects my mental health? Depression may occur at any age, but it is more common as you become older. Common symptoms of depression include:  Low or sad mood.  Changes in sleep patterns.  Changes in appetite or eating patterns.  Feeling an overall lack of motivation or enjoyment of activities that you previously enjoyed.  Frequent crying spells.  Talk with your health care provider if you think that you are experiencing depression. What should I know  about immunizations? It is important that you get and maintain your immunizations. These include:  Tetanus, diphtheria, and pertussis (Tdap) booster vaccine.  Influenza every year before the flu season begins.  Pneumonia vaccine.  Shingles vaccine.  Your health care provider may also recommend other immunizations. This information is not intended to replace advice given to you by your health care provider. Make sure you discuss any questions you have with your health care provider. Document Released: 04/25/2005 Document Revised: 09/21/2015 Document Reviewed: 12/05/2014 Elsevier Interactive Patient Education  2018 [employer].

## 2017-06-17 NOTE — Telephone Encounter (Signed)
I am happy to order labs for her.  Please confirm whether or not she wants to have a hepatitis C test done.  This is a screening test.  She is in an age group that is at high risk.  This is a virus that can lead to cirrhosis and liver cancer if she has this and it is not treated.  If she would like for this to be checked I can order this with her other labs.  Thanks.

## 2017-06-17 NOTE — Telephone Encounter (Signed)
Copied from [place]. Topic: General - Other >> Jun 17, 2017 10:03 AM Cecelia Byars, NT wrote: Reason for CRM: The patient would like labs  done / scheduled prior to her office visit with Dr Caryl Bis so the results can be discussed during her visit with him that day , during her visit please call her with the information at [phone], thanks

## 2017-06-18 NOTE — Telephone Encounter (Signed)
Please advise 

## 2017-06-18 NOTE — Telephone Encounter (Signed)
Left message to return call, ok for pec to speak to patient about message below and schedule fasting lab appointment 2 days prior to appointment

## 2017-06-18 NOTE — Telephone Encounter (Signed)
Patient called and she has scheduled her lab for 5/3- her request. She does want the Hep C screen as suggested by her PCP.

## 2017-06-18 NOTE — Telephone Encounter (Signed)
Ordered

## 2017-06-18 NOTE — Telephone Encounter (Signed)
fyi

## 2017-06-18 NOTE — Addendum Note (Signed)
Addended by: Caryl Bis ERIC G on: 06/18/2017 12:26 PM   Modules accepted: Orders

## 2017-06-19 ENCOUNTER — Encounter: Payer: Self-pay | Admitting: Family Medicine

## 2017-06-30 ENCOUNTER — Other Ambulatory Visit: Payer: Self-pay | Admitting: Family Medicine

## 2017-07-01 LAB — FECAL OCCULT BLOOD, IMMUNOCHEMICAL: Fecal Occult Bld: NEGATIVE

## 2017-07-17 ENCOUNTER — Other Ambulatory Visit (INDEPENDENT_AMBULATORY_CARE_PROVIDER_SITE_OTHER): Payer: BLUE CROSS/BLUE SHIELD

## 2017-07-17 DIAGNOSIS — Z1159 Encounter for screening for other viral diseases: Secondary | ICD-10-CM

## 2017-07-17 DIAGNOSIS — Z130 Encounter for screening for diseases of the blood and blood-forming organs and certain disorders involving the immune mechanism: Secondary | ICD-10-CM | POA: Diagnosis not present

## 2017-07-17 DIAGNOSIS — Z1329 Encounter for screening for other suspected endocrine disorder: Secondary | ICD-10-CM

## 2017-07-17 DIAGNOSIS — E1169 Type 2 diabetes mellitus with other specified complication: Secondary | ICD-10-CM | POA: Diagnosis not present

## 2017-07-17 DIAGNOSIS — E669 Obesity, unspecified: Secondary | ICD-10-CM

## 2017-07-17 DIAGNOSIS — Z13220 Encounter for screening for lipoid disorders: Secondary | ICD-10-CM | POA: Diagnosis not present

## 2017-07-17 LAB — LIPID PANEL
CHOL/HDL RATIO: 5
Cholesterol: 173 mg/dL (ref 0–200)
HDL: 37.3 mg/dL — ABNORMAL LOW (ref >39.00)
NONHDL: 136
Triglycerides: 228 mg/dL — ABNORMAL HIGH (ref 0.0–149.0)
VLDL: 45.6 mg/dL — ABNORMAL HIGH (ref 0.0–40.0)

## 2017-07-17 LAB — COMPREHENSIVE METABOLIC PANEL
ALT: 22 U/L (ref 0–35)
AST: 19 U/L (ref 0–37)
Albumin: 4 g/dL (ref 3.5–5.2)
Alkaline Phosphatase: 92 U/L (ref 39–117)
BILIRUBIN TOTAL: 0.6 mg/dL (ref 0.2–1.2)
BUN: 12 mg/dL (ref 6–23)
CHLORIDE: 103 meq/L (ref 96–112)
CO2: 27 meq/L (ref 19–32)
CREATININE: 0.82 mg/dL (ref 0.40–1.20)
Calcium: 9.3 mg/dL (ref 8.4–10.5)
GFR: 75.24 mL/min (ref >60.00)
GLUCOSE: 183 mg/dL — AB (ref 70–99)
Potassium: 3.8 mEq/L (ref 3.5–5.1)
SODIUM: 141 meq/L (ref 135–145)
Total Protein: 7 g/dL (ref 6.0–8.3)

## 2017-07-17 LAB — CBC WITH DIFFERENTIAL/PLATELET
BASOS ABS: 0 10*3/uL (ref 0.0–0.1)
Basophils Relative: 0.4 % (ref 0.0–3.0)
EOS ABS: 0.2 10*3/uL (ref 0.0–0.7)
EOS PCT: 1.9 % (ref 0.0–5.0)
HCT: 38 % (ref 36.0–46.0)
Hemoglobin: 12.6 g/dL (ref 12.0–15.0)
Lymphocytes Relative: 26.1 % (ref 12.0–46.0)
Lymphs Abs: 3 10*3/uL (ref 0.7–4.0)
MCHC: 33.3 g/dL (ref 30.0–36.0)
MCV: 86.6 fl (ref 78.0–100.0)
MONO ABS: 0.6 10*3/uL (ref 0.1–1.0)
Monocytes Relative: 5 % (ref 3.0–12.0)
Neutro Abs: 7.6 10*3/uL (ref 1.4–7.7)
Neutrophils Relative %: 66.6 % (ref 43.0–77.0)
Platelets: 474 10*3/uL — ABNORMAL HIGH (ref 150.0–400.0)
RBC: 4.39 Mil/uL (ref 3.87–5.11)
RDW: 14.6 % (ref 11.5–15.5)
WBC: 11.4 10*3/uL — AB (ref 4.0–10.5)

## 2017-07-17 LAB — LDL CHOLESTEROL, DIRECT: LDL DIRECT: 116 mg/dL

## 2017-07-17 LAB — HEMOGLOBIN A1C: HEMOGLOBIN A1C: 7.8 % — AB (ref 4.6–6.5)

## 2017-07-17 LAB — TSH: TSH: 1.94 u[IU]/mL (ref 0.35–4.50)

## 2017-07-20 LAB — HEPATITIS C ANTIBODY
HEP C AB: NONREACTIVE
SIGNAL TO CUT-OFF: 0.02 (ref <1.00)

## 2017-07-22 ENCOUNTER — Ambulatory Visit: Payer: BLUE CROSS/BLUE SHIELD | Admitting: Family Medicine

## 2017-08-24 LAB — HM DIABETES EYE EXAM

## 2017-08-25 ENCOUNTER — Encounter: Payer: Self-pay | Admitting: Family Medicine

## 2017-08-26 ENCOUNTER — Encounter: Payer: Self-pay | Admitting: Family Medicine

## 2017-08-26 ENCOUNTER — Ambulatory Visit: Payer: BLUE CROSS/BLUE SHIELD | Admitting: Family Medicine

## 2017-08-26 ENCOUNTER — Encounter

## 2017-08-26 ENCOUNTER — Other Ambulatory Visit: Payer: Self-pay | Admitting: Family Medicine

## 2017-08-26 VITALS — BP 110/80 | HR 94 | Temp 98.3°F | Ht 63.0 in | Wt 274.0 lb

## 2017-08-26 DIAGNOSIS — E669 Obesity, unspecified: Secondary | ICD-10-CM

## 2017-08-26 DIAGNOSIS — E1169 Type 2 diabetes mellitus with other specified complication: Secondary | ICD-10-CM

## 2017-08-26 DIAGNOSIS — E785 Hyperlipidemia, unspecified: Secondary | ICD-10-CM

## 2017-08-26 DIAGNOSIS — K219 Gastro-esophageal reflux disease without esophagitis: Secondary | ICD-10-CM

## 2017-08-26 DIAGNOSIS — R42 Dizziness and giddiness: Secondary | ICD-10-CM | POA: Diagnosis not present

## 2017-08-26 DIAGNOSIS — E041 Nontoxic single thyroid nodule: Secondary | ICD-10-CM | POA: Diagnosis not present

## 2017-08-26 DIAGNOSIS — D72829 Elevated white blood cell count, unspecified: Secondary | ICD-10-CM

## 2017-08-26 DIAGNOSIS — I10 Essential (primary) hypertension: Secondary | ICD-10-CM | POA: Diagnosis not present

## 2017-08-26 HISTORY — DX: Dizziness and giddiness: R42

## 2017-08-26 LAB — CBC WITH DIFFERENTIAL/PLATELET
BASOS ABS: 0.1 10*3/uL (ref 0.0–0.1)
Basophils Relative: 0.4 % (ref 0.0–3.0)
Eosinophils Absolute: 0.1 10*3/uL (ref 0.0–0.7)
Eosinophils Relative: 1 % (ref 0.0–5.0)
HCT: 40 % (ref 36.0–46.0)
Hemoglobin: 13.3 g/dL (ref 12.0–15.0)
LYMPHS ABS: 3.5 10*3/uL (ref 0.7–4.0)
Lymphocytes Relative: 25.2 % (ref 12.0–46.0)
MCHC: 33.3 g/dL (ref 30.0–36.0)
MCV: 86.6 fl (ref 78.0–100.0)
MONO ABS: 0.9 10*3/uL (ref 0.1–1.0)
Monocytes Relative: 6.5 % (ref 3.0–12.0)
NEUTROS ABS: 9.3 10*3/uL — AB (ref 1.4–7.7)
NEUTROS PCT: 66.9 % (ref 43.0–77.0)
PLATELETS: 520 10*3/uL — AB (ref 150.0–400.0)
RBC: 4.61 Mil/uL (ref 3.87–5.11)
RDW: 14.4 % (ref 11.5–15.5)
WBC: 13.9 10*3/uL — ABNORMAL HIGH (ref 4.0–10.5)

## 2017-08-26 MED ORDER — ROSUVASTATIN CALCIUM 10 MG PO TABS: 10.0000 mg | ORAL_TABLET | Freq: Every day | ORAL | 3 refills | Status: DC

## 2017-08-26 NOTE — Assessment & Plan Note (Signed)
Needs repeat.  Possibly related to prior viral illness.

## 2017-08-26 NOTE — Assessment & Plan Note (Signed)
Recent A1c uncontrolled.  Increase metformin to 1000 mg twice daily.

## 2017-08-26 NOTE — Telephone Encounter (Signed)
Would you like for me to send a new script in your note today you wanted her to start on 1000mg  twice daily. Please advise. Thank you

## 2017-08-26 NOTE — Assessment & Plan Note (Signed)
Symptoms consistent with peripheral vertigo.  She is neurologically intact.  Her symptoms have improved.  She will monitor for any worsening.  Given return precautions.

## 2017-08-26 NOTE — Patient Instructions (Signed)
Nice to see you. We will get an ultrasound of your thyroid. We will refer you to GI. Please monitor the vertigo and if it does not continue to improve please let us know. Please increase your metformin to 1000 mg twice daily. We will recheck your CBC today.

## 2017-08-26 NOTE — Progress Notes (Addendum)
Tommi Rumps, MD Phone: [phone]  Shelby Estes is a 62 y.o. female who presents today for f/u.  CC: dm, htn, thyroid nodule  DIABETES Disease Monitoring: Blood Sugar ranges-130-170 Polyuria/phagia/dipsia- no      Optho- UTD should be getting records soon Medications: Compliance- taking metformin 500 mg BID Hypoglycemic symptoms- no She has been working on exercise and diet with decreased carbs.  HYPERTENSION  Disease Monitoring  Home BP Monitoring coming down with increased BP meds through cardiology Chest pain- no    Dyspnea- no Medications  Compliance-  Taking losartan/hctz, metoprolol.  Edema- no  She notes for at least the last year she has noted a knot in her right throat over her thyroid area.  She wonders if she has something wrong with her thyroid.  Recent TSH was normal.  Notes this area has gotten a little larger over the last couple months.  Notes her toenails are brittle.  She will feel choked occasionally.  She reports she had an issue with swollen glands and vertigo with ear fullness recently.  Notes she would have spinning when she would move her head while in bed.  She notes that has improved quite a bit.  She did take some Antivert.  She does have a history of vertigo that is similar.  GERD:   Reflux symptoms: occasional despite being on medication, burning and regurgitations   Abd pain: no   Blood in stool: no  Dysphagia: no   EGD: possible remote history  Medication: protonix    Social History   Tobacco Use  Smoking Status Never Smoker  Smokeless Tobacco Never Used     ROS see history of present illness  Objective  Physical Exam Vitals:   08/26/17 1340  BP: 110/80  Pulse: 94  Temp: 98.3 F (36.8 C)  SpO2: 97%    BP Readings from Last 3 Encounters:  08/26/17 110/80  06/17/17 (!) 149/88  07/04/16 128/80   Wt Readings from Last 3 Encounters:  08/26/17 274 lb (124.3 kg)  06/17/17 276 lb 8 oz (125.4 kg)  07/04/16 268 lb 12.8 oz  (121.9 kg)    Physical Exam  Constitutional: No distress.  Neck: Neck supple. No thyromegaly present.  Possible thyroid nodule noted in right lobe, nontender  Cardiovascular: Normal rate, regular rhythm and normal heart sounds.  Pulmonary/Chest: Effort normal and breath sounds normal.  Abdominal: Soft. Bowel sounds are normal. She exhibits no distension. There is no tenderness.  Musculoskeletal: She exhibits no edema.  Neurological: She is alert.  CN 2-12 intact, 5/5 strength in bilateral biceps, triceps, grip, quads, hamstrings, plantar and dorsiflexion, sensation to light touch intact in bilateral UE and LE, normal gait, normal rapid alternating movements, normal finger-to-nose  Skin: Skin is warm and dry. She is not diaphoretic.   Diabetic Foot Exam - Simple   Simple Foot Form Diabetic Foot exam was performed with the following findings:  Yes 08/26/2017  2:11 PM  Visual Inspection No deformities, no ulcerations, no other skin breakdown bilaterally:  Yes Sensation Testing Intact to touch and monofilament testing bilaterally:  Yes Pulse Check Posterior Tibialis and Dorsalis pulse intact bilaterally:  Yes Comments      Assessment/Plan: Please see individual problem list.  Diabetes mellitus type 2 in obese Recent A1c uncontrolled.  Increase metformin to 1000 mg twice daily.  Hypertension Followed by her cardiologist.  Has improved.  GERD (gastroesophageal reflux disease) Persistent symptoms despite medication.  Refer to GI.  Hyperlipidemia Discussed need for statin therapy  given elevated LDL and diabetes.  Will start on Crestor.  She will return in 1 month for repeat labs.  Leukocytosis Needs repeat.  Possibly related to prior viral illness.  Thyroid nodule Noted on exam.  Ultrasound ordered.  Discussed this could be a benign nodule or a cancerous process.  Vertigo Symptoms consistent with peripheral vertigo.  She is neurologically intact.  Her symptoms have improved.   She will monitor for any worsening.  Given return precautions.   Orders Placed This Encounter  Procedures  . US THYROID    Standing Status:   Future    Standing Expiration Date:   10/27/2018    Order Specific Question:   Reason for Exam (SYMPTOM  OR DIAGNOSIS REQUIRED)    Answer:   thyroid nodule right lobe    Order Specific Question:   Preferred imaging location?    Answer:   [facility]  . CBC w/Diff  . LDL cholesterol, direct    Standing Status:   Future    Standing Expiration Date:   08/27/2018  . Hepatic function panel    Standing Status:   Future    Standing Expiration Date:   08/27/2018  . Ambulatory referral to Gastroenterology    Referral Priority:   Routine    Referral Type:   Consultation    Referral Reason:   Specialty Services Required    Number of Visits Requested:   1    Meds ordered this encounter  Medications  . rosuvastatin (CRESTOR) 10 MG tablet    Sig: Take 1 tablet (10 mg total) by mouth daily.    Dispense:  90 tablet    Refill:  [place], MD [place]

## 2017-08-26 NOTE — Assessment & Plan Note (Addendum)
Noted on exam.  Ultrasound ordered.  Discussed this could be a benign nodule or a cancerous process.

## 2017-08-26 NOTE — Assessment & Plan Note (Signed)
Followed by her cardiologist.  Has improved.

## 2017-08-26 NOTE — Assessment & Plan Note (Signed)
Persistent symptoms despite medication.  Refer to GI.

## 2017-08-26 NOTE — Assessment & Plan Note (Signed)
Discussed need for statin therapy given elevated LDL and diabetes.  Will start on Crestor.  She will return in 1 month for repeat labs.

## 2017-08-27 ENCOUNTER — Telehealth: Payer: Self-pay

## 2017-08-27 ENCOUNTER — Other Ambulatory Visit: Payer: Self-pay | Admitting: Family Medicine

## 2017-08-27 DIAGNOSIS — D72829 Elevated white blood cell count, unspecified: Secondary | ICD-10-CM

## 2017-08-27 MED ORDER — METFORMIN HCL 1000 MG PO TABS: 1000.0000 mg | ORAL_TABLET | Freq: Two times a day (BID) | ORAL | 1 refills | Status: DC

## 2017-08-27 NOTE — Addendum Note (Signed)
Addended by: Leone Haven on: 08/27/2017 04:33 PM   Modules accepted: Orders

## 2017-08-27 NOTE — Telephone Encounter (Signed)
Please advise 

## 2017-08-27 NOTE — Telephone Encounter (Signed)
I sent a new prescription [facility] yesterday.

## 2017-08-27 NOTE — Telephone Encounter (Signed)
Patient notified and states she does take the Effexor. Patient states she was informed to increase the metformin to 1000 mg bid. She states she will need a new rx because she is running out of the 500 mgs

## 2017-08-27 NOTE — Telephone Encounter (Signed)
Copied from [place] [phone] [phone]. Topic: Inquiry >> Aug 27, 2017 12:41 PM Scherrie Gerlach wrote: Reason for CRM: pt saw Dr Josephina Gip yesterday, and states she did not have time to talk to him about her nerves, she has a lot going on.  Pt states much of the time spent on other issues, and time ran out. Then on top of all, she needs to see a hematologist. This makes her nervous as well Pt want to know if there is anything dr can give her to help keep her calm during this time.  Also wants to know if she is supposed to double up on the metformin she already has, because the pharmacy would not fill the new Rx sent yesterday.  The dr had doubled her dose. And she has some left.

## 2017-08-27 NOTE — Telephone Encounter (Addendum)
I will resend it though it was sent for 500 mg tablets with the directions of take 2 tablets (1000 mg total) by mouth twice daily.  I am not sure how it ended up getting transmitted to the pharmacy has 1000 mg tablets as that is not what was in the system.

## 2017-08-27 NOTE — Telephone Encounter (Signed)
Prescription needs to be for 1000 mg twice daily currently it states takes 2 tablets (1000 mg total) twice daily but it was sent for 1000 mg tablets which equals 2000 mg bid . Please send new rx stating 100 mg tablets, take one tablet 1000 mg twice daily

## 2017-08-27 NOTE — Telephone Encounter (Signed)
Unfortunately since we did not discuss this in the office I am not able to prescribe anything for this issue currently.  We could have her come back to the office to discuss further during an office visit.  Please make sure she is taking her Effexor.  Thanks.

## 2017-08-31 DIAGNOSIS — D75839 Thrombocytosis, unspecified: Secondary | ICD-10-CM | POA: Insufficient documentation

## 2017-08-31 DIAGNOSIS — D473 Essential (hemorrhagic) thrombocythemia: Secondary | ICD-10-CM | POA: Insufficient documentation

## 2017-08-31 NOTE — Progress Notes (Signed)
[facility] day:  09/01/2017  Chief Complaint: Shelby Estes is a 62 y.o. female with leukocytosis who is referred in consultation by Dr. Tommi Rumps for assessment and management.  HPI:  The patient has been aware of an elevated WBC since last month.  She denies any fevers or infections.  Two weeks ago, she had an inner ear problem with congestion and swollen glands.  She denies any use of steroids.  She has lost some weight.  Metformin dose has been decreased.  She feels hot all of the time.  Colonoscopy in 2017 noted "polyps".  She has sleep apnea, but has not been using her CPAP.  CBCs have been followed: 05/18/2014:  Hematocrit 39.0, hemoglobin 13.1, MCV 84.3, platelets 463,000, WBC 11,000 with an ANC of 7600.  Differential was unremarkable. 04/11/2015:  Hematocrit 38.3, hemoglobin 12.6, MCV 85.7, platelets 389,000, WBC 10,300. 07/17/2017:  Hematocrit 38.0, hemoglobin 12.6, MCV 86.6, platelets 474,000, WBC 11,400 with an ANC of 7600. 08/26/2017:  Hematocrit 40.0, hemoglobin 13.3, MCV 86.6, platelets 520,000, WBC 13,900 with an ANC of 9300.  Differential revealed 67% segs, 25% lymphs, 7% moncytes, and 1% eosinophils.  Diet is good.  She is trying to drink more water than soda.  She denies pica.  Symptomatically, she has been tired for years.She notes epigastric discomfort at times.  She has an appointment with GI in 2 weeks.  She has diarrhea on Metformin.  She denies any family history of any blood disorders or malignancies.   Past Medical History:  Diagnosis Date  . Anxiety   . Depression   . Diabetes mellitus 2010  . Elevated BP   . GERD (gastroesophageal reflux disease)   . Hypertension   . Kidney stone    Dr. Cyndie Mull, April 2014  . Renal cyst    Dr. Cyndie Mull  . SVT (supraventricular tachycardia) [facility])    s/p cardioversion  . Vitamin D deficiency     Past Surgical History:  Procedure Laterality Date   . ABDOMINAL HYSTERECTOMY  09/27/13   Dr. Veryl Speak  . Arm Surgery     due to fracture left elbow  . CHOLECYSTECTOMY  5/07   Dr Bary Castilla  . COLONOSCOPY  2007   Dr Bary Castilla  . COLONOSCOPY WITH PROPOFOL N/A 06/20/2015   Procedure: COLONOSCOPY WITH PROPOFOL;  Surgeon: [name], MD;  Location: [facility] ENDOSCOPY;  Service: Endoscopy;  Laterality: N/A;  . TUBAL LIGATION  02-21-01    Family History  Problem Relation Age of Onset  . COPD Father   . Stroke Father   . Diabetes Mother        borderline  . Hypertension Mother   . Thyroid disease Mother   . Breast cancer Maternal Aunt   . Seizures Maternal Grandmother   . Breast cancer Other   . Cancer Other        breast  . Colon cancer Sister        42  . Colon cancer Cousin        40's    Social History:  reports that she has never smoked. She has never used smokeless tobacco. She reports that she does not drink alcohol or use drugs.  She retired in 09/2016.  She previously worked at [employer] in [place].  She lives in [place].   Allergies:  Allergies  Allergen Reactions  . Bupropion     Muscle aches   . Clarithromycin   . Enalapril  edema  . Phentermine     SVT  . Ceclor [Cefaclor] Swelling and Rash  . Penicillins Swelling and Rash  . Sulfa Antibiotics Swelling and Rash    Current Medications: Current Outpatient Medications  Medication Sig Dispense Refill  . Cholecalciferol (VITAMIN D) 2000 UNITS CAPS Take 1 capsule by mouth daily.    Marland Kitchen losartan-hydrochlorothiazide (HYZAAR) 100-12.5 MG tablet   0  . meloxicam (MOBIC) 15 MG tablet Take by mouth.    . metFORMIN (GLUCOPHAGE) 1000 MG tablet Take 1 tablet (1,000 mg total) by mouth 2 (two) times daily with a meal. 180 tablet 1  . metoprolol tartrate (LOPRESSOR) 25 MG tablet Take 1 tablet (25 mg total) by mouth 2 (two) times daily. 60 tablet 6  . pantoprazole (PROTONIX) 40 MG tablet Take 1 tablet (40 mg total) by mouth daily. 30 tablet 3  . rosuvastatin  (CRESTOR) 10 MG tablet Take 1 tablet (10 mg total) by mouth daily. 90 tablet 3  . venlafaxine XR (EFFEXOR-XR) 75 MG 24 hr capsule TAKE 1 CAPSULE BY MOUTH EVERY DAY WITH BREAKFAST 90 capsule 1  . furosemide (LASIX) 20 MG tablet      No current facility-administered medications for this visit.     Review of Systems:  GENERAL:  Tired for years.  No fevers, sweats or weight loss. PERFORMANCE STATUS (ECOG):  1 HEENT:  Sinus drainage.  No visual changes, runny nose, sore throat, mouth sores or tenderness. Lungs: No shortness of breath or cough.  No hemoptysis.  Sleep apnea. Cardiac:  No chest pain, palpitations, orthopnea, or PND. GI:  Sometimes feels choked.  Epigastric discomfort.  Diarrhea secondary to Metformin.  No nausea, vomiting, constipation, melena or hematochezia. GU:  No urgency, frequency, dysuria, or hematuria. Musculoskeletal:  Once in awhile back pain.  No joint pain.  No muscle tenderness. Extremities:  No pain or swelling. Skin:  No rashes or skin changes. Neuro:  Carpal tunnel on right.  No headache, numbness or weakness, balance or coordination issues. Endocrine:  Diabetes.  No thyroid issues, hot flashes or night sweats. Psych:  No mood changes, depression or anxiety. Pain:  No focal pain. Review of systems:  All other systems reviewed and found to be negative.  Physical Exam: Blood pressure 115/83, pulse (!) 103, temperature 98.5 F (36.9 C), temperature source Tympanic, weight 272 lb 2 oz (123.4 kg). GENERAL:  Well developed, well nourished, woman sitting comfortably in the exam room in no acute distress. MENTAL STATUS:  Alert and oriented to person, place and time. HEAD:  Shoulder length dark blonde hair.  Normocephalic, atraumatic, face symmetric, no Cushingoid features. EYES:  Glasses.  Blue eyes.  Pupils equal round and reactive to light and accomodation.  No conjunctivitis or scleral icterus. ENT:  Oropharynx clear without lesion.  Tongue normal. Mucous membranes  moist.  RESPIRATORY:  Clear to auscultation without rales, wheezes or rhonchi. CARDIOVASCULAR:  Regular rate and rhythm without murmur, rub or gallop. ABDOMEN:  Soft, non-tender, with active bowel sounds, and no hepatomegaly.  Spleen tip palpable.  No masses. SKIN:  No rashes, ulcers or lesions. EXTREMITIES: No edema, no skin discoloration or tenderness.  No palpable cords. LYMPH NODES: No palpable cervical, supraclavicular, axillary or inguinal adenopathy  NEUROLOGICAL: Unremarkable. PSYCH:  Appropriate.   Office Visit on 09/01/2017  Component Date Value Ref Range Status  . Sodium 09/01/2017 140  135 - 145 mmol/L Final  . Potassium 09/01/2017 3.5  3.5 - 5.1 mmol/L Final  . Chloride 09/01/2017 101  101 - 111 mmol/L Final  . CO2 09/01/2017 26  22 - 32 mmol/L Final  . Glucose, Bld 09/01/2017 171* 65 - 99 mg/dL Final  . BUN 09/01/2017 15  6 - 20 mg/dL Final  . Creatinine, Ser 09/01/2017 0.86  0.44 - 1.00 mg/dL Final  . Calcium 09/01/2017 9.9  8.9 - 10.3 mg/dL Final  . Total Protein 09/01/2017 8.0  6.5 - 8.1 g/dL Final  . Albumin 09/01/2017 4.2  3.5 - 5.0 g/dL Final  . AST 09/01/2017 26  15 - 41 U/L Final  . ALT 09/01/2017 22  14 - 54 U/L Final  . Alkaline Phosphatase 09/01/2017 101  38 - 126 U/L Final  . Total Bilirubin 09/01/2017 0.6  0.3 - 1.2 mg/dL Final  . GFR calc non Af Amer 09/01/2017 >60  >60 mL/min Final  . GFR calc Af Amer 09/01/2017 >60  >60 mL/min Final   Comment: (NOTE) The eGFR has been calculated using the CKD EPI equation. This calculation has not been validated in all clinical situations. eGFR's persistently <60 mL/min signify possible Chronic Kidney Disease.   Shelby Estes gap 09/01/2017 13  5 - 15 Final   Performed at [facility], [address] [place], [place] [address]  . WBC 09/01/2017 17.2* 3.6 - 11.0 K/uL Final  . RBC 09/01/2017 4.76  3.80 - 5.20 MIL/uL Final  . Hemoglobin 09/01/2017 13.9  12.0 - 16.0 g/dL Final  . HCT 09/01/2017 41.0  35.0 - 47.0  % Final  . MCV 09/01/2017 86.1  80.0 - 100.0 fL Final  . MCH 09/01/2017 29.2  26.0 - 34.0 pg Final  . MCHC 09/01/2017 33.9  32.0 - 36.0 g/dL Final  . RDW 09/01/2017 14.3  11.5 - 14.5 % Final  . Platelets 09/01/2017 468* 150 - 440 K/uL Final  . Neutrophils Relative % 09/01/2017 74  % Final  . Neutro Abs 09/01/2017 12.6* 1.4 - 6.5 K/uL Final  . Lymphocytes Relative 09/01/2017 19  % Final  . Lymphs Abs 09/01/2017 3.3  1.0 - 3.6 K/uL Final  . Monocytes Relative 09/01/2017 6  % Final  . Monocytes Absolute 09/01/2017 1.0* 0.2 - 0.9 K/uL Final  . Eosinophils Relative 09/01/2017 1  % Final  . Eosinophils Absolute 09/01/2017 0.2  0 - 0.7 K/uL Final  . Basophils Relative 09/01/2017 0  % Final  . Basophils Absolute 09/01/2017 0.1  0 - 0.1 K/uL Final   Performed at [facility], [address] [place], [place] [address]  . Ferritin 09/01/2017 54  11 - 307 ng/mL Final   Performed at [facility], [address] [place], [place] [address]  . Sed Rate 09/01/2017 30  0 - 30 mm/hr Final   Performed at [facility], [address] [place], [place] [address]    Assessment:  Shelby Estes is a 62 y.o. female with mild leukocytosis and thrombocytosis.  WBC has ranged between 10,300 - 13,900 since 05/2014.  Platelet count has ranged between 389,000 - 520,000 since 05/2014.  Symptomatically, she has been fatigued for years.  She denies any B symptoms.  She has epigastric symptoms.  Spleen tip appears palpable.  She has no adenopathy or hepatomegaly.    Plan: 1.  Discuss mild leukocytosis of unclear etiology.  Leukocytosis appears mild and likely reactive.  However, platelet count has also been elevated and may indicate a myeloproliferative disorder.  Platelet count may also be reactive.  Discuss laboratory work-up. 2.  Labs today:  CBC with diff, CMP, sed rate, ferritin, JAK2 with reflex, BCR-ABL. 3.  RTC in 2 weeks for MD assessment, review of work-up and discussion  regarding direction of therapy.   Lequita Asal, MD  09/01/2017, 3:35 PM

## 2017-09-01 ENCOUNTER — Inpatient Hospital Stay: Payer: BLUE CROSS/BLUE SHIELD

## 2017-09-01 ENCOUNTER — Other Ambulatory Visit: Payer: Self-pay

## 2017-09-01 ENCOUNTER — Inpatient Hospital Stay: Payer: BLUE CROSS/BLUE SHIELD | Attending: Hematology and Oncology | Admitting: Hematology and Oncology

## 2017-09-01 ENCOUNTER — Encounter: Payer: Self-pay | Admitting: Hematology and Oncology

## 2017-09-01 VITALS — BP 115/83 | HR 103 | Temp 98.5°F | Wt 272.1 lb

## 2017-09-01 DIAGNOSIS — F418 Other specified anxiety disorders: Secondary | ICD-10-CM

## 2017-09-01 DIAGNOSIS — R5383 Other fatigue: Secondary | ICD-10-CM | POA: Diagnosis not present

## 2017-09-01 DIAGNOSIS — Z7984 Long term (current) use of oral hypoglycemic drugs: Secondary | ICD-10-CM | POA: Diagnosis not present

## 2017-09-01 DIAGNOSIS — Z87442 Personal history of urinary calculi: Secondary | ICD-10-CM

## 2017-09-01 DIAGNOSIS — Z803 Family history of malignant neoplasm of breast: Secondary | ICD-10-CM | POA: Diagnosis not present

## 2017-09-01 DIAGNOSIS — Z86010 Personal history of colonic polyps: Secondary | ICD-10-CM | POA: Diagnosis not present

## 2017-09-01 DIAGNOSIS — K219 Gastro-esophageal reflux disease without esophagitis: Secondary | ICD-10-CM

## 2017-09-01 DIAGNOSIS — G4700 Insomnia, unspecified: Secondary | ICD-10-CM | POA: Insufficient documentation

## 2017-09-01 DIAGNOSIS — I10 Essential (primary) hypertension: Secondary | ICD-10-CM

## 2017-09-01 DIAGNOSIS — R634 Abnormal weight loss: Secondary | ICD-10-CM | POA: Diagnosis not present

## 2017-09-01 DIAGNOSIS — D75839 Thrombocytosis, unspecified: Secondary | ICD-10-CM

## 2017-09-01 DIAGNOSIS — D473 Essential (hemorrhagic) thrombocythemia: Secondary | ICD-10-CM

## 2017-09-01 DIAGNOSIS — Z800 Family history of malignant neoplasm of digestive organs: Secondary | ICD-10-CM | POA: Diagnosis not present

## 2017-09-01 DIAGNOSIS — D72829 Elevated white blood cell count, unspecified: Secondary | ICD-10-CM | POA: Diagnosis present

## 2017-09-01 DIAGNOSIS — Z79899 Other long term (current) drug therapy: Secondary | ICD-10-CM | POA: Insufficient documentation

## 2017-09-01 DIAGNOSIS — E559 Vitamin D deficiency, unspecified: Secondary | ICD-10-CM | POA: Diagnosis not present

## 2017-09-01 DIAGNOSIS — I471 Supraventricular tachycardia: Secondary | ICD-10-CM | POA: Diagnosis not present

## 2017-09-01 DIAGNOSIS — E119 Type 2 diabetes mellitus without complications: Secondary | ICD-10-CM

## 2017-09-01 LAB — COMPREHENSIVE METABOLIC PANEL
ALT: 22 U/L (ref 14–54)
AST: 26 U/L (ref 15–41)
Albumin: 4.2 g/dL (ref 3.5–5.0)
Alkaline Phosphatase: 101 U/L (ref 38–126)
Anion gap: 13 (ref 5–15)
BUN: 15 mg/dL (ref 6–20)
CHLORIDE: 101 mmol/L (ref 101–111)
CO2: 26 mmol/L (ref 22–32)
CREATININE: 0.86 mg/dL (ref 0.44–1.00)
Calcium: 9.9 mg/dL (ref 8.9–10.3)
GFR calc Af Amer: >60 mL/min (ref >60)
GFR calc non Af Amer: >60 mL/min (ref >60)
Glucose, Bld: 171 mg/dL — ABNORMAL HIGH (ref 65–99)
Potassium: 3.5 mmol/L (ref 3.5–5.1)
SODIUM: 140 mmol/L (ref 135–145)
Total Bilirubin: 0.6 mg/dL (ref 0.3–1.2)
Total Protein: 8 g/dL (ref 6.5–8.1)

## 2017-09-01 LAB — CBC WITH DIFFERENTIAL/PLATELET
Basophils Absolute: 0.1 10*3/uL (ref 0–0.1)
Basophils Relative: 0 %
EOS ABS: 0.2 10*3/uL (ref 0–0.7)
EOS PCT: 1 %
HCT: 41 % (ref 35.0–47.0)
HEMOGLOBIN: 13.9 g/dL (ref 12.0–16.0)
LYMPHS ABS: 3.3 10*3/uL (ref 1.0–3.6)
Lymphocytes Relative: 19 %
MCH: 29.2 pg (ref 26.0–34.0)
MCHC: 33.9 g/dL (ref 32.0–36.0)
MCV: 86.1 fL (ref 80.0–100.0)
Monocytes Absolute: 1 10*3/uL — ABNORMAL HIGH (ref 0.2–0.9)
Monocytes Relative: 6 %
NEUTROS PCT: 74 %
Neutro Abs: 12.6 10*3/uL — ABNORMAL HIGH (ref 1.4–6.5)
Platelets: 468 10*3/uL — ABNORMAL HIGH (ref 150–440)
RBC: 4.76 MIL/uL (ref 3.80–5.20)
RDW: 14.3 % (ref 11.5–14.5)
WBC: 17.2 10*3/uL — ABNORMAL HIGH (ref 3.6–11.0)

## 2017-09-01 LAB — FERRITIN: FERRITIN: 54 ng/mL (ref 11–307)

## 2017-09-01 LAB — SEDIMENTATION RATE: Sed Rate: 30 mm/hr (ref 0–30)

## 2017-09-01 NOTE — Progress Notes (Signed)
New patient visit, referred by Dr Caryl Bis for leukocytosis.

## 2017-09-07 ENCOUNTER — Ambulatory Visit
Admission: RE | Admit: 2017-09-07 | Discharge: 2017-09-07 | Disposition: A | Discharge status: Home / Self Care | Payer: BLUE CROSS/BLUE SHIELD | Source: Ambulatory Visit | Attending: Family Medicine | Admitting: Family Medicine

## 2017-09-07 DIAGNOSIS — E041 Nontoxic single thyroid nodule: Secondary | ICD-10-CM | POA: Diagnosis present

## 2017-09-07 DIAGNOSIS — E010 Iodine-deficiency related diffuse (endemic) goiter: Secondary | ICD-10-CM | POA: Insufficient documentation

## 2017-09-10 LAB — BCR-ABL1 FISH
CELLS ANALYZED: 200
Cells Counted: 200

## 2017-09-10 LAB — CALR + JAK2 E12-15 + MPL (REFLEXED)

## 2017-09-10 LAB — JAK2 V617F, W REFLEX TO CALR/E12/MPL

## 2017-09-15 ENCOUNTER — Inpatient Hospital Stay: Payer: BLUE CROSS/BLUE SHIELD | Attending: Hematology and Oncology | Admitting: Hematology and Oncology

## 2017-09-15 ENCOUNTER — Encounter: Payer: Self-pay | Admitting: Hematology and Oncology

## 2017-09-15 VITALS — BP 118/84 | HR 76 | Temp 98.3°F | Resp 18 | Wt 267.4 lb

## 2017-09-15 DIAGNOSIS — I471 Supraventricular tachycardia: Secondary | ICD-10-CM | POA: Diagnosis not present

## 2017-09-15 DIAGNOSIS — D473 Essential (hemorrhagic) thrombocythemia: Secondary | ICD-10-CM | POA: Diagnosis not present

## 2017-09-15 DIAGNOSIS — D72829 Elevated white blood cell count, unspecified: Secondary | ICD-10-CM

## 2017-09-15 DIAGNOSIS — R5382 Chronic fatigue, unspecified: Secondary | ICD-10-CM

## 2017-09-15 DIAGNOSIS — E119 Type 2 diabetes mellitus without complications: Secondary | ICD-10-CM | POA: Insufficient documentation

## 2017-09-15 DIAGNOSIS — E559 Vitamin D deficiency, unspecified: Secondary | ICD-10-CM | POA: Insufficient documentation

## 2017-09-15 DIAGNOSIS — Z87442 Personal history of urinary calculi: Secondary | ICD-10-CM | POA: Diagnosis not present

## 2017-09-15 DIAGNOSIS — F418 Other specified anxiety disorders: Secondary | ICD-10-CM

## 2017-09-15 DIAGNOSIS — Z9049 Acquired absence of other specified parts of digestive tract: Secondary | ICD-10-CM | POA: Diagnosis not present

## 2017-09-15 DIAGNOSIS — D75839 Thrombocytosis, unspecified: Secondary | ICD-10-CM

## 2017-09-15 DIAGNOSIS — Z79899 Other long term (current) drug therapy: Secondary | ICD-10-CM | POA: Diagnosis not present

## 2017-09-15 DIAGNOSIS — I10 Essential (primary) hypertension: Secondary | ICD-10-CM | POA: Diagnosis not present

## 2017-09-15 DIAGNOSIS — Z7984 Long term (current) use of oral hypoglycemic drugs: Secondary | ICD-10-CM | POA: Insufficient documentation

## 2017-09-15 DIAGNOSIS — Z800 Family history of malignant neoplasm of digestive organs: Secondary | ICD-10-CM | POA: Diagnosis not present

## 2017-09-15 DIAGNOSIS — Z803 Family history of malignant neoplasm of breast: Secondary | ICD-10-CM | POA: Diagnosis not present

## 2017-09-15 DIAGNOSIS — K219 Gastro-esophageal reflux disease without esophagitis: Secondary | ICD-10-CM | POA: Diagnosis not present

## 2017-09-15 NOTE — Progress Notes (Signed)
Patient states she feels full after eating small portions.  She sometimes has to throw up small amounts.  Otherwise, offers no complaints.

## 2017-09-15 NOTE — Progress Notes (Signed)
[facility] day: 09/15/2017   Chief Complaint: Shelby Estes is a 62 y.o. female with leukocytosis who is seen for review of work-up and discussion regarding direction of therapy.  HPI:  The patient was last seen in the hematology clinic on 09/01/2017 for initial consultation.  WBC had ranged between 10,300 - 13,900 since 05/2014.  Platelet count had ranged between 389,000 - 520,000 since 05/2014.  Work-up on 09/01/2017 revealed a hematocrit 41.0, hemoglobin 13.9, MCV 86.1, platelets 468,000, WBC 17,200 with an ANC of 12,600.  Differential was unremarkable.  The following labs were normal:  JAK2 V617F, JAK2 exon 12-15, CALR, MPL, BCR-ABL, ferritin (54), and sed rate (30).  During the interim, patient is doing well today, and does not express any acute concerns. Patient denies B symptoms. She has not experienced any significant interval infections.  Patient advises that she maintains an adequate appetite. She is eating well. Weight today is 267 lb 6 oz (121.3 kg), which compared to her last visit to the clinic, represents a  5 pound decrease.   Patient denies pain in the clinic today.   Past Medical History:  Diagnosis Date  . Anxiety   . Depression   . Diabetes mellitus 2010  . Elevated BP   . GERD (gastroesophageal reflux disease)   . Hypertension   . Kidney stone    Dr. Cyndie Mull, April 2014  . Renal cyst    Dr. Cyndie Mull  . SVT (supraventricular tachycardia) [facility])    s/p cardioversion  . Vitamin D deficiency     Past Surgical History:  Procedure Laterality Date  . ABDOMINAL HYSTERECTOMY  09/27/13   Dr. Veryl Speak  . Arm Surgery     due to fracture left elbow  . CHOLECYSTECTOMY  5/07   Dr Bary Castilla  . COLONOSCOPY  2007   Dr Bary Castilla  . COLONOSCOPY WITH PROPOFOL N/A 06/20/2015   Procedure: COLONOSCOPY WITH PROPOFOL;  Surgeon: [name], MD;  Location: [facility] ENDOSCOPY;  Service: Endoscopy;  Laterality:  N/A;  . TUBAL LIGATION  02-21-01    Family History  Problem Relation Age of Onset  . COPD Father   . Stroke Father   . Diabetes Mother        borderline  . Hypertension Mother   . Thyroid disease Mother   . Breast cancer Maternal Aunt   . Seizures Maternal Grandmother   . Breast cancer Other   . Cancer Other        breast  . Colon cancer Sister        50  . Colon cancer Cousin        40's    Social History:  reports that she has never smoked. She has never used smokeless tobacco. She reports that she does not drink alcohol or use drugs.  She retired in 09/2016.  She previously worked at [employer] in [place].  She lives in [place]. She is accompanied by her husband, Jerrye Beavers.  Allergies:  Allergies  Allergen Reactions  . Bupropion     Muscle aches   . Clarithromycin   . Enalapril     edema  . Phentermine     SVT  . Ceclor [Cefaclor] Swelling and Rash  . Penicillins Swelling and Rash  . Sulfa Antibiotics Swelling and Rash    Current Medications: Current Outpatient Medications  Medication Sig Dispense Refill  . Cholecalciferol (VITAMIN D) 2000 UNITS CAPS Take 1 capsule by mouth daily.    Marland Kitchen  furosemide (LASIX) 20 MG tablet     . losartan-hydrochlorothiazide (HYZAAR) 100-12.5 MG tablet   0  . meloxicam (MOBIC) 15 MG tablet Take by mouth.    . metFORMIN (GLUCOPHAGE) 1000 MG tablet Take 1 tablet (1,000 mg total) by mouth 2 (two) times daily with a meal. 180 tablet 1  . metoprolol tartrate (LOPRESSOR) 25 MG tablet Take 1 tablet (25 mg total) by mouth 2 (two) times daily. 60 tablet 6  . pantoprazole (PROTONIX) 40 MG tablet Take 1 tablet (40 mg total) by mouth daily. 30 tablet 3  . rosuvastatin (CRESTOR) 10 MG tablet Take 1 tablet (10 mg total) by mouth daily. 90 tablet 3  . venlafaxine XR (EFFEXOR-XR) 75 MG 24 hr capsule TAKE 1 CAPSULE BY MOUTH EVERY DAY WITH BREAKFAST 90 capsule 1   No current facility-administered medications for this visit.     Review of  Systems:  GENERAL:  Doing well today.  No fevers, sweats.  Weight down 5 pounds. PERFORMANCE STATUS (ECOG):  1 HEENT:  Sinus drainage.  No visual changes, runny nose, sore throat, mouth sores or tenderness. Lungs: No shortness of breath or cough.  No hemoptysis. Cardiac:  No chest pain, palpitations, orthopnea, or PND. GI:  Eats a little and feels full.  Reflux.  No nausea, vomiting, diarrhea, constipation, melena or hematochezia. GU:  No urgency, frequency, dysuria, or hematuria. Musculoskeletal:  Rare back pain.  No joint pain.  No muscle tenderness. Extremities:  No pain or swelling. Skin:  No rashes or skin changes. Neuro:  Right carpal tunnel.  No headache, numbness or weakness, balance or coordination issues. Endocrine:  Diabetes.  No thyroid issues, hot flashes or night sweats. Psych:  No mood changes, depression or anxiety. Pain:  No focal pain. Review of systems:  All other systems reviewed and found to be negative.   Physical Exam: Blood pressure 118/84, pulse 76, temperature 98.3 F (36.8 C), resp. rate 18, weight 267 lb 6 oz (121.3 kg), SpO2 98 %. GENERAL:  Well developed, well nourished, woman sitting comfortably in the exam room in no acute distress. MENTAL STATUS:  Alert and oriented to person, place and time. HEAD:  Shoulder length dark blonde hair.  Normocephalic, atraumatic, face symmetric, no Cushingoid features. EYES:  Glasses.  Blue eyes.  No conjunctivitis or scleral icterus.  NEUROLOGICAL: Unremarkable. PSYCH:  Appropriate.    No visits with results within 3 Day(s) from this visit.  Latest known visit with results is:  Office Visit on 09/01/2017  Component Date Value Ref Range Status  . JAK2 GenotypR 09/01/2017 Comment   Final   Comment: (NOTE) Result: NEGATIVE for the JAK2 V617F mutation. Interpretation:  The G to T nucleotide change encoding the V617F mutation was not detected.  This result does not rule out the presence of the JAK2 mutation at a level  below the sensitivity of detection of this assay, or the presence of other mutations within JAK2 not detected by this assay.  This result does not rule out a diagnosis of polycythemia vera, essential thrombocythemia or idiopathic myelofibrosis as the V617F mutation is not detected in all patients with these disorders.   Marland Kitchen BACKGROUND: 09/01/2017 Comment   Final   Comment: (NOTE) JAK2 is a cytoplasmic tyrosine kinase with a key role in signal transduction from multiple hematopoietic growth factor receptors. A point mutation within exon 14 of the JAK2 gene ([id number]) encoding a valine to phenylalanine substitution at position 617 of the JAK2 protein (V617F) has been identified in  most patients with polycythemia vera, and in about half of those with either essential thrombocythemia or idiopathic myelofibrosis. The V617F has also been detected, although infrequently, in other myeloid disorders such as chronic myelomonocytic leukemia and chronic neutrophilic luekemia. V617F is an acquired mutation that alters a highly conserved valine present in the negative regulatory JH2 domain of the JAK2 protein and is predicted to dysregulate kinase activity. Methodology: Total genomic DNA was extracted and subjected to TaqMan real-time PCR amplification/detection. Two amplification products per sample were monitored by real-time PCR using primers/probes s                          pecific to JAK2 wild type (WT) and JAK2 mutant V617F. The ABI7900 Absolute Quantitation software will compare the patient specimen valuse to the standard curves and generate percent values for wild type and mutant type. In vitro studies have indicated that this assay has an analytical sensitivity of 1%. References: Baxter EJ, Scott Phineas Real, et al. Acquired mutation of the tyrosine kinase JAK2 in human myeloproliferative disorders. Lancet. 2005 Mar 19-25; 365(9464):1054-1061. Alfonso Ramus Couedic JP. A unique  clonal JAK2 mutation leading to constitutive signaling causes polycythaemia vera. Nature. 2005 Apr 28; 434(7037):1144-1148. Kralovics R, Passamonti F, Buser AS, et al. A gain-of-function mutation of JAK2 in myeloproliferative disorders. N Engl J Med. 2005 Apr 28; 352(17):1779-1790.   . Director Review, JAK2 09/01/2017 Comment   Final   Comment: (NOTE) Constance Goltz, PhD, [facility]               Director, [place] for [place], [place]               [phone] This test was developed and its performance characteristics determined by [facility]. It has not been cleared or approved by the Food and Drug Administration.   Marland Kitchen REFLEX: 09/01/2017 Comment   Final   Comment: (NOTE) Reflex to CALR Mutation Analysis, JAK2 Exon 12-15 Mutation Analysis, and MPL Mutation Analysis is indicated.   Marland Kitchen Extraction 09/01/2017 Completed   Corrected   Comment: (NOTE) Performed At: [facility] [address] [place], [place] [id number] Nechama Guard MD [id number] Performed At: [facility] [place], [place] [id number] Nechama Guard MD [id number] Performed at [facility], [address], [place], [place] [address]   . Specimen Type 09/01/2017 BLOOD   Final  . Cells Counted 09/01/2017 200   Final  . Cells Analyzed 09/01/2017 200   Final  . FISH Result 09/01/2017 Comment:   Final   NORMAL:  NO BCR OR ABL GENE REARRANGEMENT OBSERVED  . Interpretation 09/01/2017 Comment:   Final   Comment: (NOTE)             nuc ish 9q34(ASS1,ABL1)x2,22q11.2(BCRx2)[200].      The fluorescence in situ hybridization (FISH) study was normal. FISH, using unique sequence DNA probes for the ABL1 and BCR gene regions showed two ABL1 signals (red), two control ASS1 gene signals (aqua) located adjacent to the ABL1 locus at 9q34, and two BCR signals (green) at 22q11.2 in all  interphase nuclei examined. There was NO evidence of CML or ALL-associated  BCR/ABL1 dual fusion signals in this analysis. .      This analysis is limited to abnormalities detectable by the specific probes included in the study. FISH results should be interpreted within the context of a full cytogenetic analysis and pathology evaluation. Marland Kitchen  .This test was developed and its performance characteristics determined by [facility]). It has not been cleared or approved by the U.S. Food and Drug Administration. The DNA probe vendor for this study was [employer], research (physical sciences))                          .   . Director Review: 09/01/2017 Comment:   Final   Comment: (NOTE) Chandra Batch, PhD, [facility] Performed At: [facility] [place], [place] [id number] Nechama Guard MD KN:[id number] Performed At: [facility] [address] [place], [place] [id number] Rush Farmer MD [id number] Performed at [facility], [address], [place], [place] [address]   . Sodium 09/01/2017 140  135 - 145 mmol/L Final  . Potassium 09/01/2017 3.5  3.5 - 5.1 mmol/L Final  . Chloride 09/01/2017 101  101 - 111 mmol/L Final  . CO2 09/01/2017 26  22 - 32 mmol/L Final  . Glucose, Bld 09/01/2017 171* 65 - 99 mg/dL Final  . BUN 09/01/2017 15  6 - 20 mg/dL Final  . Creatinine, Ser 09/01/2017 0.86  0.44 - 1.00 mg/dL Final  . Calcium 09/01/2017 9.9  8.9 - 10.3 mg/dL Final  . Total Protein 09/01/2017 8.0  6.5 - 8.1 g/dL Final  . Albumin 09/01/2017 4.2  3.5 - 5.0 g/dL Final  . AST 09/01/2017 26  15 - 41 U/L Final  . ALT 09/01/2017 22  14 - 54 U/L Final  . Alkaline Phosphatase 09/01/2017 101  38 - 126 U/L Final  . Total Bilirubin 09/01/2017 0.6  0.3 - 1.2 mg/dL Final  . GFR calc non Af Amer 09/01/2017 >60  >60 mL/min Final  . GFR calc Af Amer 09/01/2017 >60  >60 mL/min Final   Comment: (NOTE) The eGFR has been calculated using the CKD  EPI equation. This calculation has not been validated in all clinical situations. eGFR's persistently <60 mL/min signify possible Chronic Kidney Disease.   Georgiann Hahn gap 09/01/2017 13  5 - 15 Final   Performed at [facility], [address] [place], [place] [address]  . WBC 09/01/2017 17.2* 3.6 - 11.0 K/uL Final  . RBC 09/01/2017 4.76  3.80 - 5.20 MIL/uL Final  . Hemoglobin 09/01/2017 13.9  12.0 - 16.0 g/dL Final  . HCT 09/01/2017 41.0  35.0 - 47.0 % Final  . MCV 09/01/2017 86.1  80.0 - 100.0 fL Final  . MCH 09/01/2017 29.2  26.0 - 34.0 pg Final  . MCHC 09/01/2017 33.9  32.0 - 36.0 g/dL Final  . RDW 09/01/2017 14.3  11.5 - 14.5 % Final  . Platelets 09/01/2017 468* 150 - 440 K/uL Final  . Neutrophils Relative % 09/01/2017 74  % Final  . Neutro Abs 09/01/2017 12.6* 1.4 - 6.5 K/uL Final  . Lymphocytes Relative 09/01/2017 19  % Final  . Lymphs Abs 09/01/2017 3.3  1.0 - 3.6 K/uL Final  . Monocytes Relative 09/01/2017 6  % Final  . Monocytes Absolute 09/01/2017 1.0* 0.2 - 0.9 K/uL Final  . Eosinophils Relative 09/01/2017 1  % Final  . Eosinophils Absolute 09/01/2017 0.2  0 - 0.7 K/uL  Final  . Basophils Relative 09/01/2017 0  % Final  . Basophils Absolute 09/01/2017 0.1  0 - 0.1 K/uL Final   Performed at [facility], [address] [place], [place] [address]  . Ferritin 09/01/2017 54  11 - 307 ng/mL Final   Performed at [facility], [address] [place], [place] [address]  . Sed Rate 09/01/2017 30  0 - 30 mm/hr Final   Performed at [facility], [address] [place], [place] [address]  . CALR Mutation Detection Result 09/01/2017 Comment   Final   Comment: (NOTE) NEGATIVE No insertions or deletions were detected within the analyzed region of the calreticulin (CALR) gene. A negative result does not entirely exclude the possibility of a clonal population carrying CALR gene mutations that are not covered by this assay. Results should be  interpreted in conjunction with clinical and laboratory findings for the most accurate interpretation.   . Background: 09/01/2017 Comment   Final   Comment: (NOTE) The calcium-binding endoplasmic reticulin chaperone protein, calreticulin (CALR), is somatically mutated in approximately 70% of patients with JAK2-negative essential thrombocythemia (ET) and 60- 88% of patients with JAK2-negative primary myelofibrosis(PMF). Only a minority of patients (approximately 8%) with myelodysplasia have mutations in  CALR gene. CALR mutations are rarely detected in patients with de novo acute myeloid leukemia, chronic myelogenous leukemia, lymphoid leukemia, or solid tumors. CALR mutations are not detected in polycythemia and generally appear to be mutually exclusive with JAK2 mutations and MPL mutations. The majority of mutational changes involve a variety of insertion or deletion mutations in exon 9 of the calreticulin gene: approximately 53% of all CALR mutations are a 52 bp deletion (type-1) while the second most prevalent mutation (approximately 32%) contains a 5 bp insertion (type-2). Other mutations (non-type 1 or type 2) are seen                           in a small minority of cases. CALR mutations in PMF tend to be associated with a favorable prognosis compared to JAK2 V617F mutations, whereas primary myelofibrosis negative for CALR, JAK2 V617F and MPL mutations (so-called triple negative) is associated with a poor prognosis and shorter survival. The detection of a CALR gene mutation aids in the specific diagnosis of a myeloproliferative neoplasm, and help distinguish this clonal disease from a benign reactive process.   . Methodology: 09/01/2017 Comment   Final   Comment: (NOTE) Genomic DNA was isolated from the provided specimen. Polymerase chain reaction (PCR) of exon 9 of the CALR gene was performed with specific fluorescent-labeled primers, and the PCR product was analyzed by  capillary gel electrophoresis to determine the size of the PCR products. This PCR assay is capable of detecting a mutant cell population with a sensitivity of 5 mutant cells per 100 normal cells. A negative result does not exclude the presence of a myeloproliferative disorder or other neoplastic process. This test was developed and its performance characteristics determined by [facility]. It has not been cleared or approved by the Food and Drug Administration. The FDA has determined that such clearance or approval is not necessary.   . References: 09/01/2017 Comment   Final   Comment: (NOTE) 1. Klampfel, T. et al. (2013) Somatic mutations of calreticulin in   myeloproliferative neoplasms. New Engl. J. Med. 938:1829-9371. 2. Haynes Kerns et al. (2013) Somatic CALR mutations in   myeloproliferative neoplasms with nonmutated JAK2. New Engl. J.   Med. [phone].   Marland Kitchen  Director Review 09/01/2017 Comment   Final   Comment: (NOTE) Constance Goltz, PhD, [facility]               Director, [place] for [place] and [place], [place]   . JAK2 Exons 12-15 Mut Det PCR: 09/01/2017 Comment   Final   Comment: (NOTE) NEGATIVE JAK2 mutations were not detected in exons 12, 13, 14 and 15. This result does not rule out the presence of JAK2 mutation at a level below the detection sensitivity of this assay, the presence of other mutations outside the analyzed region of the JAK2 gene, or the presence of a myeloproliferative or other neoplasm. Result must be correlated with other clinical data for the most accurate diagnosis.   Marland Kitchen BACKGROUND: 09/01/2017 Comment   Final   Comment: (NOTE) JAK2 V617F mutation is detected in patients with polycythemia vera (95%), essential thrombocythemia (50%) and primary myelofibrosis (50%). A small percentage of JAK2 mutation positive patients (3.3%) contain  other non-V617F mutations within exons 12 to 15. The detection of a JAK2 gene mutation aids in the specific diagnosis of a myeloproliferative neoplasm, and help distinguish this clonal disease from a benign reactive process.   . Method 09/01/2017 Comment   Final   Comment: (NOTE) Total RNA was purified from the provided specimen. The JAK2 gene region covering exons 12 to 15 was subjected to reverse- transcription coupled PCR amplification, and bi-directional sequencing to identify sequence variations. This assay has a sensitivity to detect approximately 15% population of cells containing the JAK2 mutations in a background of non-mutant cells. This test was developed and its performance characteristics determined by [facility]. It has not been cleared or approved by the Food and Drug Administration.   . References 09/01/2017 Comment   Final   Comment: (NOTE) Algasham, N. et al. Detection of mutations in JAK2 exons 12-15 by Sanger sequencing. Int J Lab Hemato. 2015, 38:34-41. Joelene Millin al. Mutation profile of JAK2 transcripts in patients with chronic myeloproliferative neoplasias. J Mol Diagn. 2009, 11:49-53.   Marland Kitchen DIRECTOR REVIEW: 09/01/2017 Comment   Final   Comment: (NOTE) Loni Muse, PhD, [facility]    Director, [place] for [place] and Pathology    [address] [place], [place] [address]    [phone]   . MPL MUTATION ANALYSIS RESULT: 09/01/2017 Comment   Final   Comment: (NOTE) No MPL mutation was identified in the provided specimen of this individual. Results should be interpreted in conjunction with clinical and other laboratory findings for the most accurate interpretation.   Marland Kitchen BACKGROUND: 09/01/2017 Comment   Final   Comment: (NOTE) MPL (myeloproliferative leukemia virus oncogene homology) belongs to the hematopoietin superfamily and enables its ligand thrombopoietin to facilitate both global hematopoiesis and megakaryocyte growth and  differentiation. MPL W515 mutations are present in patients with primary myelofibrosis (PMF) and essential thrombocythemia (ET) at a frequency of approximately 5% and 1% respectively. The S505 mutation is detected in patients with hereditary thrombocythemia.   Marland Kitchen METHODOLOGY: 09/01/2017 Comment   Final   Comment: (NOTE) Genomic DNA was purified from the provided specimen. MPL gene region covering the [facility]  and W515L/K mutations were subjected to PCR amplification and bi-directional sequencing in duplicate to identify sequence variations. This assay has a sensitivity to detect approximately 20-25% population of cells containing the MPL mutations in a background of non-mutant cells. This assay will not detect the mutation below the sensitivity of this assay. Molecular- based testing is highly accurate, but as in any laboratory test, rare diagnostic errors may occur.   Marland Kitchen REFERENCES: 09/01/2017 Comment   Final   Comment: (NOTE) 1. Pardanani AD, et al. (2006). MPL515 mutations in   myeloproliferative and other myeloid disorders: a study   of 1182 patients. Blood 032:1224-8250. 2. Andre Lefort and Levine RL. (2008). JAK2 and MPL   mutations in myeloproliferative neoplasms: discovery and   science. Leukemia 22:1813-1817. 3. Juline Patch, et al. (2009). Evidence for a founder effect   of the MPL-S505N mutation in eight [place] pedigrees with   hereditary thrombocythemia. Haematologica 94(10):1368-   0370.   Marland Kitchen DIRECTOR REVIEW: 09/01/2017 Comment   Final   Comment: (NOTE) Loni Muse, PhD, [facility]    Director, [place] for [place] and [place], [place] [address]    [phone] This test was developed and its performance characteristics determined by [facility]. It has not been cleared or approved by the Food and Drug Administration.   . Extraction 09/01/2017 Comment   Final   Comment: (NOTE) This sample has been received and DNA  extraction has been performed. Performed At: [facility] [address] [place], [place] [id number] Nechama Guard MD XT:[id number] Performed At: [facility] [address] [place], [place] [id number] Nechama Guard MD [id number] Performed at [facility], [address] [place],  [address]     Assessment:  Shelby Estes is a 62 y.o. female with mild leukocytosis and thrombocytosis.  WBC has ranged between 10,300 - 13,900 since 05/2014.  Platelet count has ranged between 389,000 - 520,000 since 05/2014.  Work-up on 09/01/2017 revealed a hematocrit 41.0, hemoglobin 13.9, MCV 86.1, platelets 468,000, WBC 17,200 with an ANC of 12,600.  Differential was unremarkable.  Normal labs included:  JAK2 V617F, JAK2 exon 12-15, CALR, MPL, BCR-ABL, ferritin (54),and  sed rate (30).  Symptomatically, she has chronic fatigue.  She denies any B symptoms.  She has epigastric symptoms.  Spleen tip appears palpable.  She has no adenopathy or hepatomegaly.    Plan: 1.  Review work-up.  Etiology likely reactive.  Work-up negative. 2.  Discuss consideration of ultrasound to assess spleen. 3.  RTC in 6 months for MD assess and labs (CBC with diff, CMP, ferritin, sed rate).   Honor Loh, NP  09/15/2017, 3:51 PM   I saw and evaluated the patient, participating in the key portions of the service and reviewing pertinent diagnostic studies and records.  I reviewed the nurse practitioner's note and agree with the findings and the plan.  The assessment and plan were discussed with the patient.  A few questions were asked by the patient and answered.   Nolon Stalls, MD 09/15/2017,3:51 PM

## 2017-09-23 ENCOUNTER — Ambulatory Visit: Payer: BLUE CROSS/BLUE SHIELD | Admitting: Gastroenterology

## 2017-09-23 ENCOUNTER — Encounter: Payer: Self-pay | Admitting: Gastroenterology

## 2017-09-23 VITALS — BP 92/67 | HR 89 | Wt 267.7 lb

## 2017-09-23 DIAGNOSIS — K219 Gastro-esophageal reflux disease without esophagitis: Secondary | ICD-10-CM

## 2017-09-23 NOTE — Progress Notes (Signed)
Jonathon Bellows MD, MRCP(U.K) [address]  [place], [place] [address]  Main: [phone]  Fax: [phone]   Gastroenterology Consultation  Referring Provider:     Leone Haven, MD Primary Care Physician:  Leone Haven, MD Primary Gastroenterologist:  Dr. Jonathon Bellows  Reason for Consultation:     GERD        HPI:   ARABELA BASALDUA is a 62 y.o. y/o female referred for consultation & management  by Dr. Caryl Bis, Angela Adam, MD.    She has been referred for GERD.   She says that over the past few years her GERD/burping has got worse and the past few months has a feeling of fullnes even after a very small meal, feels all siting in her stomach . Throws up within 90 mins of her meals. Says she eats very little. Says she has tried and lost some weight last few months. Says the metformin causes her to go more often to have a bowel movement . So far she has been on Protonix for the past 2 years, controls her symptoms most of the times and occasionally has to take some TUMS usually has the symptoms more in the night before bed time.    She has her dinner at 6-7 pm and does not go to bed till about 11. She says her Hba1c is around 7 . She takes meloxicam . Says she has "gas pains" . Feels better after a bowel  Movement . No EGD.    Past Medical History:  Diagnosis Date  . Anxiety   . Depression   . Diabetes mellitus 2010  . Elevated BP   . GERD (gastroesophageal reflux disease)   . Hypertension   . Kidney stone    Dr. Cyndie Mull, April 2014  . Renal cyst    Dr. Cyndie Mull  . SVT (supraventricular tachycardia) [facility])    s/p cardioversion  . Vitamin D deficiency     Past Surgical History:  Procedure Laterality Date  . ABDOMINAL HYSTERECTOMY  09/27/13   Dr. Veryl Speak  . Arm Surgery     due to fracture left elbow  . CHOLECYSTECTOMY  5/07   Dr Bary Castilla  . COLONOSCOPY  2007   Dr Bary Castilla  . COLONOSCOPY WITH PROPOFOL N/A 06/20/2015   Procedure: COLONOSCOPY WITH PROPOFOL;  Surgeon: [name], MD;  Location: [facility] ENDOSCOPY;  Service: Endoscopy;  Laterality: N/A;  . TUBAL LIGATION  02-21-01    Prior to Admission medications   Medication Sig Start Date End Date Taking? Authorizing Provider  Cholecalciferol (VITAMIN D) 2000 UNITS CAPS Take 1 capsule by mouth daily.    [provider]  furosemide (LASIX) 20 MG tablet  01/05/15   [provider]  losartan-hydrochlorothiazide Konrad Penta) 100-12.5 MG tablet  08/20/17   [provider]  meloxicam (MOBIC) 15 MG tablet Take by mouth. 10/01/15   [provider]  metFORMIN (GLUCOPHAGE) 1000 MG tablet Take 1 tablet (1,000 mg total) by mouth 2 (two) times daily with a meal. 08/27/17   Leone Haven, MD  metoprolol tartrate (LOPRESSOR) 25 MG tablet Take 1 tablet (25 mg total) by mouth 2 (two) times daily. 05/18/14   Jackolyn Confer, MD  pantoprazole (PROTONIX) 40 MG tablet Take 1 tablet (40 mg total) by mouth daily. 06/03/16   Leone Haven, MD  rosuvastatin (CRESTOR) 10 MG tablet Take 1 tablet (10 mg total) by mouth daily. 08/26/17   Leone Haven, MD  venlafaxine XR (EFFEXOR-XR) 75 MG 24 hr capsule TAKE 1 CAPSULE BY MOUTH EVERY DAY WITH BREAKFAST 05/13/17   Leone Haven, MD    Family History  Problem Relation Age of Onset  . COPD Father   . Stroke Father   . Diabetes Mother        borderline  . Hypertension Mother   . Thyroid disease Mother   . Breast cancer Maternal Aunt   . Seizures Maternal Grandmother   . Breast cancer Other   . Cancer Other        breast  . Colon cancer Sister        53  . Colon cancer Cousin        40's     Social History   Tobacco Use  . Smoking status: Never Smoker  . Smokeless tobacco: Never Used  Substance Use Topics  . Alcohol use: No  . Drug use: No    Allergies as of 09/23/2017 - Review Complete 09/15/2017  Allergen Reaction Noted  . Bupropion  06/22/2014  . Clarithromycin   04/29/2011  . Enalapril  01/08/2015  . Phentermine  03/01/2013  . Ceclor [cefaclor] Swelling and Rash 04/21/2011  . Penicillins Swelling and Rash 04/21/2011  . Sulfa antibiotics Swelling and Rash 04/21/2011    Review of Systems:    All systems reviewed and negative except where noted in HPI.   Physical Exam:  There were no vitals taken for this visit. No LMP recorded. Patient has had a hysterectomy. Psych:  Alert and cooperative. Normal mood and affect. General:   Alert,  Well-developed, well-nourished, pleasant and cooperative in NAD Head:  Normocephalic and atraumatic. Eyes:  Sclera clear, no icterus.   Conjunctiva pink. Ears:  Normal auditory acuity. Nose:  No deformity, discharge, or lesions. Mouth:  No deformity or lesions,oropharynx pink & moist. Neck:  Supple; no masses or thyromegaly. Lungs:  Respirations even and unlabored.  Clear throughout to auscultation.   No wheezes, crackles, or rhonchi. No acute distress. Heart:  Regular rate and rhythm; no murmurs, clicks, rubs, or gallops. Abdomen:  Normal bowel sounds.  No bruits.  Soft, non-tender and non-distended without masses, hepatosplenomegaly or hernias noted.  No guarding or rebound tenderness.    Neurologic:  Alert and oriented x3;  grossly normal neurologically. Skin:  Intact without significant lesions or rashes. No jaundice. Lymph Nodes:  No significant cervical adenopathy. Psych:  Alert and cooperative. Normal mood and affect.  Imaging Studies: US Thyroid  Result Date: 09/07/2017 CLINICAL DATA:  Nodule. EXAM: THYROID ULTRASOUND TECHNIQUE: Ultrasound examination of the thyroid gland and adjacent soft tissues was performed. COMPARISON:  None. FINDINGS: Parenchymal Echotexture: Moderately heterogenous Isthmus: 0.5 cm thickness Right lobe: 5 x 1.8 x 2.1 cm Left lobe: 4.5 x 1.6 x 1.5 cm _________________________________________________________ Estimated total number of nodules >/= 1 cm: 2 Number of spongiform nodules >/=   2 cm not described below (TR1): 0 Number of mixed cystic and solid nodules >/= 1.5 cm not described below ([place]): 0 _________________________________________________________ Nodule # 3: Location: Right; Inferior Maximum size: 2.3 cm; Other 2 dimensions: 2 x 1.3 cm Composition: cystic/almost completely cystic (0) Echogenicity: hypoechoic (2) Shape: not taller-than-wide (0) Margins: smooth (0) Echogenic foci: none (0) ACR TI-RADS total points: 2. ACR TI-RADS risk category: TR2 (2 points). ACR TI-RADS recommendations: This nodule does NOT meet TI-RADS criteria for biopsy or dedicated follow-up. _________________________________________________________ Nodule # 5: Location: Left; Superior Maximum size: 0.9 cm; Other 2 dimensions: 0.8 x 0.6 cm Composition:  solid/almost completely solid (2) Echogenicity: hypoechoic (2) Shape: not taller-than-wide (0) Margins: ill-defined (0) Echogenic foci: none (0) ACR TI-RADS total points: 4. ACR TI-RADS risk category: TR4 (4-6 points). ACR TI-RADS recommendations: Given size (<0.9 cm) and appearance, this nodule does NOT meet TI-RADS criteria for biopsy or dedicated follow-up. _________________________________________________________ Nodule # 7: Location: Left; Inferior Maximum size: 1 cm; Other 2 dimensions: 0.7 x 0.6 cm Composition: cystic/almost completely cystic (0) Echogenicity: anechoic (0) Shape: not taller-than-wide (0) Margins: smooth (0) Echogenic foci: macrocalcifications (1) ACR TI-RADS total points: 1. ACR TI-RADS risk category: TR1 (0-1 points). ACR TI-RADS recommendations: This nodule does NOT meet TI-RADS criteria for biopsy or dedicated follow-up. _________________________________________________________ At least 5 additional subcentimeter hypoechoic or cystic lesions, none of which meets criteria for biopsy or dedicated imaging follow-up. IMPRESSION: 1. 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 None meets criteria for biopsy or dedicated imaging follow-up. The  above is in keeping with the ACR TI-RADS recommendations - J Am Coll Radiol 2017;14:587-595. Electronically Signed   By: Lucrezia Europe M.D.   On: 09/07/2017 16:33    Assessment and Plan:   KOLETTE VEY is a 62 y.o. y/o female has been referred for GERD.   Plan  GERD : Counseled on life style changes, suggest to use PPI first thing in the morning on empty stomach and eat 30 minutes after. Advised on the use of a wedge pillow at night , avoid meals for 3 hours prior to bed time. Weight loss .Discussed the risks and benefits of long term PPI use including but not limited to bone loss, chronic kidney disease, infections , low magnesium . Aim to use at the lowest dose for the shortest period of time . Commence on Zantac 150 mg at night - if no better in 6 weeks will consider EGD   Follow up in 6 -8 weeks   Dr Jonathon Bellows MD,MRCP(U.K)

## 2017-09-23 NOTE — Patient Instructions (Signed)
Counseled on life style changes, suggest to use PPI first thing in the morning on empty stomach and eat 30 minutes after. Advised on the use of a wedge pillow at night , avoid meals for 3 hours prior to bed time. Weight loss Discussed the risks and benefits of long term PPI use including but not limited to bone loss, chronic kidney disease, infections , low magnesium . Aim to use at the lowest dose for the shortest period of time . Start zantac 150 mg at night

## 2017-09-25 ENCOUNTER — Other Ambulatory Visit (INDEPENDENT_AMBULATORY_CARE_PROVIDER_SITE_OTHER): Payer: BLUE CROSS/BLUE SHIELD

## 2017-09-25 ENCOUNTER — Telehealth: Payer: Self-pay | Admitting: Family Medicine

## 2017-09-25 DIAGNOSIS — E785 Hyperlipidemia, unspecified: Secondary | ICD-10-CM | POA: Diagnosis not present

## 2017-09-25 DIAGNOSIS — E042 Nontoxic multinodular goiter: Secondary | ICD-10-CM

## 2017-09-25 LAB — HEPATIC FUNCTION PANEL
ALK PHOS: 87 U/L (ref 39–117)
ALT: 17 U/L (ref 0–35)
AST: 15 U/L (ref 0–37)
Albumin: 4 g/dL (ref 3.5–5.2)
BILIRUBIN DIRECT: 0.1 mg/dL (ref 0.0–0.3)
BILIRUBIN TOTAL: 0.6 mg/dL (ref 0.2–1.2)
Total Protein: 7 g/dL (ref 6.0–8.3)

## 2017-09-25 LAB — LDL CHOLESTEROL, DIRECT: Direct LDL: 41 mg/dL

## 2017-09-25 NOTE — Telephone Encounter (Signed)
Pt would like a referral for a ENT.

## 2017-09-25 NOTE — Telephone Encounter (Signed)
Please advise 

## 2017-09-27 NOTE — Telephone Encounter (Signed)
Referral placed.

## 2017-12-01 ENCOUNTER — Other Ambulatory Visit: Payer: Self-pay | Admitting: Family Medicine

## 2017-12-01 NOTE — Telephone Encounter (Signed)
Last OV 08/27/2017   Last refilled 05/13/2017 disp 90 with 1 refill   Sent to PCP to advise

## 2017-12-14 ENCOUNTER — Encounter: Payer: Self-pay | Admitting: Family Medicine

## 2017-12-14 ENCOUNTER — Ambulatory Visit: Payer: BLUE CROSS/BLUE SHIELD | Admitting: Family Medicine

## 2017-12-14 ENCOUNTER — Ambulatory Visit (INDEPENDENT_AMBULATORY_CARE_PROVIDER_SITE_OTHER): Payer: BLUE CROSS/BLUE SHIELD

## 2017-12-14 VITALS — BP 120/70 | HR 74 | Temp 98.2°F | Ht 63.0 in | Wt 270.6 lb

## 2017-12-14 DIAGNOSIS — K219 Gastro-esophageal reflux disease without esophagitis: Secondary | ICD-10-CM

## 2017-12-14 DIAGNOSIS — E669 Obesity, unspecified: Secondary | ICD-10-CM | POA: Diagnosis not present

## 2017-12-14 DIAGNOSIS — E785 Hyperlipidemia, unspecified: Secondary | ICD-10-CM | POA: Diagnosis not present

## 2017-12-14 DIAGNOSIS — I10 Essential (primary) hypertension: Secondary | ICD-10-CM | POA: Diagnosis not present

## 2017-12-14 DIAGNOSIS — E1169 Type 2 diabetes mellitus with other specified complication: Secondary | ICD-10-CM | POA: Diagnosis not present

## 2017-12-14 DIAGNOSIS — Z9989 Dependence on other enabling machines and devices: Secondary | ICD-10-CM

## 2017-12-14 DIAGNOSIS — Z23 Encounter for immunization: Secondary | ICD-10-CM | POA: Diagnosis not present

## 2017-12-14 DIAGNOSIS — M79671 Pain in right foot: Secondary | ICD-10-CM | POA: Insufficient documentation

## 2017-12-14 DIAGNOSIS — G4733 Obstructive sleep apnea (adult) (pediatric): Secondary | ICD-10-CM

## 2017-12-14 LAB — CBC WITH DIFFERENTIAL/PLATELET
BASOS PCT: 0.3 % (ref 0.0–3.0)
Basophils Absolute: 0 10*3/uL (ref 0.0–0.1)
EOS PCT: 1.5 % (ref 0.0–5.0)
Eosinophils Absolute: 0.2 10*3/uL (ref 0.0–0.7)
HEMATOCRIT: 35.3 % — AB (ref 36.0–46.0)
Hemoglobin: 11.8 g/dL — ABNORMAL LOW (ref 12.0–15.0)
LYMPHS PCT: 24.4 % (ref 12.0–46.0)
Lymphs Abs: 2.9 10*3/uL (ref 0.7–4.0)
MCHC: 33.3 g/dL (ref 30.0–36.0)
MCV: 90.6 fl (ref 78.0–100.0)
Monocytes Absolute: 0.6 10*3/uL (ref 0.1–1.0)
Monocytes Relative: 5 % (ref 3.0–12.0)
NEUTROS ABS: 8.2 10*3/uL — AB (ref 1.4–7.7)
Neutrophils Relative %: 68.8 % (ref 43.0–77.0)
PLATELETS: 469 10*3/uL — AB (ref 150.0–400.0)
RBC: 3.9 Mil/uL (ref 3.87–5.11)
RDW: 16 % — AB (ref 11.5–15.5)
WBC: 12 10*3/uL — ABNORMAL HIGH (ref 4.0–10.5)

## 2017-12-14 LAB — HEMOGLOBIN A1C: Hgb A1c MFr Bld: 8 % — ABNORMAL HIGH (ref 4.6–6.5)

## 2017-12-14 LAB — URIC ACID: Uric Acid, Serum: 9.5 mg/dL — ABNORMAL HIGH (ref 2.4–7.0)

## 2017-12-14 NOTE — Assessment & Plan Note (Signed)
Adequately controlled.  She does seem to have some lightheadedness which could be related to her blood pressure medications though she is not orthostatic today.  Her lightheadedness seems to be orthostatic in nature.  Advised to rise slowly.  She will keep an eye on her blood pressure.  Continue current medication.

## 2017-12-14 NOTE — Assessment & Plan Note (Signed)
Continue Crestor 

## 2017-12-14 NOTE — Assessment & Plan Note (Signed)
Check A1c.  Continue metformin. 

## 2017-12-14 NOTE — Assessment & Plan Note (Signed)
She will continue CPAP.  She will discuss her hypersomnia with her specialist.

## 2017-12-14 NOTE — Assessment & Plan Note (Signed)
Seems to be adequately controlled.  The choked sensation has resolved and may have been related to her reflux.  She will monitor for recurrence.

## 2017-12-14 NOTE — Progress Notes (Signed)
Shelby Rumps, MD Phone: [phone]  Shelby Estes is a 62 y.o. female who presents today for f/u.  CC: DM, HLD, HTN, right foot pain  HYPERTENSION Disease Monitoring: Blood pressure range-112-130/75-80, one occasion of 98/50 and felt lightheaded Chest pain- no      Dyspnea- no Medications: Compliance- taking losartan/hctz, lasix Lightheadedness- yes when getting up at times, feels light headed and like she needs to take a deep breath   Edema- no  DIABETES Disease Monitoring: Blood Sugar ranges-130-170 Polyuria/phagia/dipsia- no      Optho- UTD Medications: Compliance- taking metformin Hypoglycemic symptoms- no  HYPERLIPIDEMIA Disease Monitoring: See symptoms for Hypertension Medications: Compliance- taking crestor Right upper quadrant pain- no  Muscle aches- no  Right foot pain: Patient notes right first MTP joint discomfort that has been going on for over a week now.  She had similar discomfort in her left foot.  She thought maybe it could be gout and she took some celery seed.  Some decreased range of motion.  Typically hurts if she walks and puts pressure on it.  Mild tenderness on palpation.  No injury.  It has been stable.  No fevers.  No spreading redness.  Patient reports she saw GI and they placed her on Zantac.  She has had no more swallowing difficulty or feeling choked since this.  OSA: She uses her CPAP for 8 to 9 hours nightly.  She does follow with a sleep specialist and just had a sleep study completed.  She does report hypersomnia during the day.  She sees her sleep specialist in the next several weeks.   Social History   Tobacco Use  Smoking Status Never Smoker  Smokeless Tobacco Never Used     ROS see history of present illness  Objective  Physical Exam Vitals:   12/14/17 1330  BP: 120/70  Pulse: 74  Temp: 98.2 F (36.8 C)  SpO2: 98%   Laying blood pressure 126/80 Sitting blood pressure 120/80 Standing blood pressure 122/80  BP  Readings from Last 3 Encounters:  12/14/17 120/70  09/23/17 92/67  09/15/17 118/84   Wt Readings from Last 3 Encounters:  12/14/17 270 lb 9.6 oz (122.7 kg)  09/23/17 267 lb 11.2 oz (121.4 kg)  09/15/17 267 lb 6 oz (121.3 kg)    Physical Exam  Constitutional: No distress.  Cardiovascular: Normal rate, regular rhythm and normal heart sounds.  Pulmonary/Chest: Effort normal and breath sounds normal.  Musculoskeletal: She exhibits no edema.  Right first MTP joint with minimal erythema over her bunion, there is no significant tenderness of this area, there is no warmth, there is no induration, there is slight decreased range of motion on flexion and extension of the right first MTP joint, left first MTP joint with no tenderness, warmth, or erythema, full range of motion left first MTP joint, bilateral feet warm and well-perfused  Neurological: She is alert.  Skin: Skin is warm and dry. She is not diaphoretic.     Assessment/Plan: Please see individual problem list.  Hypertension Adequately controlled.  She does seem to have some lightheadedness which could be related to her blood pressure medications though she is not orthostatic today.  Her lightheadedness seems to be orthostatic in nature.  Advised to rise slowly.  She will keep an eye on her blood pressure.  Continue current medication.  Diabetes mellitus type 2 in obese Check A1c.  Continue metformin.  Hyperlipidemia Continue Crestor.  Right foot pain Right first MTP joint pain.  Clinically does  not seem consistent with gout given the lack of significant discomfort on exam.  Does not seem consistent with infection either.  Could be related to her bunion or arthritis.  We will check an x-ray.  We will check lab work.  Determine next step in management based on these results.  GERD (gastroesophageal reflux disease) Seems to be adequately controlled.  The choked sensation has resolved and may have been related to her reflux.  She  will monitor for recurrence.  OSA on CPAP She will continue CPAP.  She will discuss her hypersomnia with her specialist.   Health Maintenance: Pneumonia vaccine given today.  Orders Placed This Encounter  Procedures  . DG Foot Complete Right    Standing Status:   Future    Standing Expiration Date:   02/14/2019    Order Specific Question:   Reason for Exam (SYMPTOM  OR DIAGNOSIS REQUIRED)    Answer:   right foot pain at first MTP joint, decreased ROM, no tenderness    Order Specific Question:   Preferred imaging location?    Answer:   [facility] Specific Question:   Radiology Contrast Protocol - do NOT remove file path    Answer:   [email or web address]  . Uric acid  . CBC w/Diff  . HgB A1c    No orders of the defined types were placed in this encounter.    Shelby Rumps, MD [place]

## 2017-12-14 NOTE — Patient Instructions (Signed)
Nice to see you. We will get labs today.  We will get an x-ray today.  We will contact you with the results.  Please rise slowly from seated positions.  Please keep an eye on your blood pressure.

## 2017-12-14 NOTE — Assessment & Plan Note (Signed)
Right first MTP joint pain.  Clinically does not seem consistent with gout given the lack of significant discomfort on exam.  Does not seem consistent with infection either.  Could be related to her bunion or arthritis.  We will check an x-ray.  We will check lab work.  Determine next step in management based on these results.

## 2017-12-15 ENCOUNTER — Other Ambulatory Visit: Payer: Self-pay | Admitting: Family Medicine

## 2017-12-15 DIAGNOSIS — D649 Anemia, unspecified: Secondary | ICD-10-CM

## 2017-12-17 ENCOUNTER — Telehealth: Payer: Self-pay | Admitting: Family Medicine

## 2017-12-17 MED ORDER — EMPAGLIFLOZIN 10 MG PO TABS: 10.0000 mg | ORAL_TABLET | Freq: Every day | ORAL | 3 refills | Status: DC

## 2017-12-17 NOTE — Telephone Encounter (Signed)
Copied from [place] [id number]. Topic: Quick Communication - Rx Refill/Question >> Dec 17, 2017  9:40 AM Oliver Pila B wrote: Pt called and asked for [facility]; pt states she was expecting a call back yesterday about some medications that the pcp was going to put her on and/or change; contact pt to advise

## 2017-12-17 NOTE — Telephone Encounter (Signed)
Called and spoke with pt. Pt advised and voiced understanding.  

## 2017-12-17 NOTE — Telephone Encounter (Signed)
I have sent the Jardiance to her pharmacy.  She should be made aware that if she develops an illness with diarrhea or vomiting while taking the Jardiance she should hold the Jardiance until the symptoms resolve and then restart it.  Given her blood sugars steroids may not be the best option.  Please see if the patient can take anti-inflammatories such as ibuprofen or Aleve.  We may need to use an anti-inflammatory that will not elevate her blood sugar to help with her foot pain.  She can decrease her metformin to 500 mg twice daily when she starts on the Jardiance.  She does not need to do anything right now for her anemia as we just need to recheck it to confirm whether or not she is truly anemic as it potentially could be lab error.  Thanks.

## 2017-12-17 NOTE — Telephone Encounter (Signed)
Patient returned call

## 2017-12-17 NOTE — Telephone Encounter (Signed)
  1) Pt stated that she is still dealing pain. Pt  Stated that she was unsure if the pain was related to AR or GOUT. Pain is not any worse than what it was the other day. Still dealing with foot being swollen and and pain in her big toe. Pt is aggreeable to starting a steroid if Dr. Caryl Bis feels that it would help send this to [facility].   2)Pt is willing to start Jardiance to treat her DM sent to [facility].   3)Pt did ask what she should do to help with her being anemic I did suggest taking OTC iron supplement 325 MG daily  4)pt wanted to know that if she gets started on the Jardiance can she go on a lower dose of metformin pt stated that she feels that the metformin has made her BS worse.  Sent to PCP   Pt has NOT been scheduled for f/u lab appt as requested

## 2018-01-22 ENCOUNTER — Ambulatory Visit: Payer: BLUE CROSS/BLUE SHIELD | Admitting: Gastroenterology

## 2018-01-25 ENCOUNTER — Ambulatory Visit: Payer: BLUE CROSS/BLUE SHIELD | Admitting: Gastroenterology

## 2018-03-05 ENCOUNTER — Other Ambulatory Visit: Payer: Self-pay | Admitting: Family Medicine

## 2018-03-22 ENCOUNTER — Ambulatory Visit: Payer: BLUE CROSS/BLUE SHIELD | Admitting: Hematology and Oncology

## 2018-03-22 ENCOUNTER — Other Ambulatory Visit: Payer: BLUE CROSS/BLUE SHIELD

## 2018-03-22 ENCOUNTER — Other Ambulatory Visit: Payer: Self-pay | Admitting: Family Medicine

## 2018-04-18 NOTE — Progress Notes (Signed)
[facility] day: 04/19/2018   Chief Complaint: Shelby Estes is a 63 y.o. female with leukocytosis who is seen for 6 month assessment.  HPI:  The patient was last seen in the hematology clinic on 09/15/2017.  At that time,  she noted chronic fatigue.  She denied any B symptoms.  She had epigastric symptoms.  Spleen tip appeared palpable.  She had no palpable adenopathy or hepatomegaly.  Work-up of her leukocytosis was unrevealing and felt reactive.   CBC on 12/14/2017 revealed a hematocrit of 35.3, hemoglobin 11.8, MCV 90.6, platelets 469,000, WBC 12,000 with an ANC of 8200.  Hgb A1C was 8.0.  Uric acid was 9.5.  During the interim, patient is doing well overall. She denies any acute concerns today. Patient notes that her health has improved over the course of the last 6 months. She has discontinued sodas, which has resolved her acid reflux. Patient denies that she has experienced any B symptoms. She denies any interval infections.   Patient complains of BILATERAL knee pain. She has received steroid injections to her knees; last 12/2017. Patient has been advised that she will need replacements BILATERALLY.   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   Patient advises that she maintains an adequate appetite. She is eating well. Weight today is 254 lb 13.6 oz (115.6 kg), which compared to her last visit to the clinic, represents a 12 pound decrease. Patient is actively making a conscious effort to reduce her weight through lifestyle modification.   Patient complains of chronic pain, rated 3/10, in the clinic today.   Past Medical History:  Diagnosis Date  . Anxiety   . Depression   . Diabetes mellitus 2010  . Elevated BP   . GERD (gastroesophageal reflux disease)   . Hypertension   . Kidney stone    Dr. Cyndie Mull, April 2014  . Renal cyst    Dr. Cyndie Mull  . SVT  (supraventricular tachycardia) [facility])    s/p cardioversion  . Vitamin D deficiency     Past Surgical History:  Procedure Laterality Date  . ABDOMINAL HYSTERECTOMY  09/27/13   Dr. Veryl Speak  . Arm Surgery     due to fracture left elbow  . CHOLECYSTECTOMY  5/07   Dr Bary Castilla  . COLONOSCOPY  2007   Dr Bary Castilla  . COLONOSCOPY WITH PROPOFOL N/A 06/20/2015   Procedure: COLONOSCOPY WITH PROPOFOL;  Surgeon: [name], MD;  Location: [facility] ENDOSCOPY;  Service: Endoscopy;  Laterality: N/A;  . TUBAL LIGATION  02-21-01    Family History  Problem Relation Age of Onset  . COPD Father   . Stroke Father   . Diabetes Mother        borderline  . Hypertension Mother   . Thyroid disease Mother   . Breast cancer Maternal Aunt   . Seizures Maternal Grandmother   . Breast cancer Other   . Cancer Other        breast  . Colon cancer Sister        49  . Colon cancer Cousin        40's    Social History:  reports that she has never smoked. She has never used smokeless tobacco. She reports that she does not drink alcohol or use drugs.  She retired in 09/2016.  She previously worked at [employer] in [place].  She lives in [place].  Her  husband's name is Jerrye Beavers.  She is alone today.  Allergies:  Allergies  Allergen Reactions  . Bupropion     Muscle aches   . Clarithromycin   . Enalapril     edema  . Phentermine     SVT  . Ceclor [Cefaclor] Swelling and Rash  . Penicillins Swelling and Rash  . Sulfa Antibiotics Swelling and Rash    Current Medications: Current Outpatient Medications  Medication Sig Dispense Refill  . citalopram (CELEXA) 20 MG tablet Take 20 mg by mouth daily.     . empagliflozin (JARDIANCE) 10 MG TABS tablet TAKE 1 TABLET BY MOUTH ONCE DAILY 30 tablet 3  . furosemide (LASIX) 20 MG tablet     . losartan-hydrochlorothiazide (HYZAAR) 100-12.5 MG tablet Take 1 tablet by mouth daily.   0  . Melatonin Gummies 2.5 MG CHEW Chew 1 tablet by mouth as needed.     . meloxicam (MOBIC) 15 MG tablet Take 15 mg by mouth daily.     . metFORMIN (GLUCOPHAGE) 1000 MG tablet Take 1 tablet (1,000 mg total) by mouth 2 (two) times daily with a meal. 180 tablet 1  . metoprolol tartrate (LOPRESSOR) 25 MG tablet Take 1 tablet (25 mg total) by mouth 2 (two) times daily. 60 tablet 6  . ranitidine (ZANTAC) 150 MG tablet Take 150 mg by mouth 2 (two) times daily.    . rosuvastatin (CRESTOR) 10 MG tablet Take 1 tablet (10 mg total) by mouth daily. 90 tablet 3  . venlafaxine XR (EFFEXOR-XR) 75 MG 24 hr capsule TAKE 1 CAPSULE BY MOUTH EVERY DAY WITH BREAKFAST 90 capsule 1  . Cholecalciferol (VITAMIN D) 2000 UNITS CAPS Take 1 capsule by mouth daily.    . pantoprazole (PROTONIX) 40 MG tablet Take 1 tablet (40 mg total) by mouth daily. (Patient not taking: Reported on 12/14/2017) 30 tablet 3   No current facility-administered medications for this visit.     Review of Systems:  GENERAL:  Doing well.  Health improved.  No fevers, sweats.  Weight loss of 12 pounds (intentional). PERFORMANCE STATUS (ECOG):  0 HEENT:  No visual changes, runny nose, sore throat, mouth sores or tenderness. Lungs: No shortness of breath or cough.  No hemoptysis. Cardiac:  No chest pain, palpitations, orthopnea, or PND. GI:  Reflux, resolved.  No nausea, vomiting, diarrhea, constipation, melena or hematochezia. GU:  No urgency, frequency, dysuria, or hematuria. Musculoskeletal:  No back pain.  Bilateral knee pain.  No muscle tenderness. Extremities:  No pain or swelling. Skin:  No rashes or skin changes. Neuro:  Carpal tunnel.  No headache, numbness or weakness, balance or coordination issues. Endocrine:  Diabetes.  Thyroid cysts (see HPI). No hot flashes or night sweats. Psych:  No mood changes, depression or anxiety. Pain:  Chronic pain (3 out of 10). Review of systems:  All other systems reviewed and found to be negative.   Physical Exam: Blood pressure 114/73, pulse 70, temperature 98.1 F  (36.7 C), temperature source Oral, resp. rate 16, weight 254 lb 13.6 oz (115.6 kg), SpO2 95 %. GENERAL:  Well developed, well nourished, woman sitting comfortably in the exam room in no acute distress. MENTAL STATUS:  Alert and oriented to person, place and time. HEAD:  Short dark blonde hair.  Normocephalic, atraumatic, face symmetric, no Cushingoid features. EYES:  Glasses.  Blue eyes.  Pupils equal round and reactive to light and accomodation.  No conjunctivitis or scleral icterus. ENT:  Oropharynx clear without lesion.  Tongue normal.  Mucous membranes moist.  RESPIRATORY:  Clear to auscultation without rales, wheezes or rhonchi. CARDIOVASCULAR:  Regular rate and rhythm without murmur, rub or gallop. ABDOMEN:  Soft, non-tender, with active bowel sounds, and no appreciable hepatosplenomegaly.  No masses. SKIN:  No rashes, ulcers or lesions. EXTREMITIES: No edema, no skin discoloration or tenderness.  No palpable cords. LYMPH NODES: No palpable cervical, supraclavicular, axillary or inguinal adenopathy  NEUROLOGICAL: Unremarkable. PSYCH:  Appropriate.    Appointment on 04/19/2018  Component Date Value Ref Range Status  . Sodium 04/19/2018 139  135 - 145 mmol/L Final  . Potassium 04/19/2018 3.5  3.5 - 5.1 mmol/L Final  . Chloride 04/19/2018 102  98 - 111 mmol/L Final  . CO2 04/19/2018 23  22 - 32 mmol/L Final  . Glucose, Bld 04/19/2018 153* 70 - 99 mg/dL Final  . BUN 04/19/2018 21  8 - 23 mg/dL Final  . Creatinine, Ser 04/19/2018 0.93  0.44 - 1.00 mg/dL Final  . Calcium 04/19/2018 9.2  8.9 - 10.3 mg/dL Final  . Total Protein 04/19/2018 7.5  6.5 - 8.1 g/dL Final  . Albumin 04/19/2018 4.2  3.5 - 5.0 g/dL Final  . AST 04/19/2018 16  15 - 41 U/L Final  . ALT 04/19/2018 15  0 - 44 U/L Final  . Alkaline Phosphatase 04/19/2018 95  38 - 126 U/L Final  . Total Bilirubin 04/19/2018 0.8  0.3 - 1.2 mg/dL Final  . GFR calc non Af Amer 04/19/2018 >60  >60 mL/min Final  . GFR calc Af Amer  04/19/2018 >60  >60 mL/min Final  . Anion gap 04/19/2018 14  5 - 15 Final   Performed at [facility], [address], [place], [place] [address]  . WBC 04/19/2018 11.4* 4.0 - 10.5 K/uL Final  . RBC 04/19/2018 4.17  3.87 - 5.11 MIL/uL Final  . Hemoglobin 04/19/2018 12.7  12.0 - 15.0 g/dL Final  . HCT 04/19/2018 38.2  36.0 - 46.0 % Final  . MCV 04/19/2018 91.6  80.0 - 100.0 fL Final  . MCH 04/19/2018 30.5  26.0 - 34.0 pg Final  . MCHC 04/19/2018 33.2  30.0 - 36.0 g/dL Final  . RDW 04/19/2018 14.3  11.5 - 15.5 % Final  . Platelets 04/19/2018 481* 150 - 400 K/uL Final  . nRBC 04/19/2018 0.0  0.0 - 0.2 % Final  . Neutrophils Relative % 04/19/2018 74  % Final  . Neutro Abs 04/19/2018 8.5* 1.7 - 7.7 K/uL Final  . Lymphocytes Relative 04/19/2018 19  % Final  . Lymphs Abs 04/19/2018 2.1  0.7 - 4.0 K/uL Final  . Monocytes Relative 04/19/2018 6  % Final  . Monocytes Absolute 04/19/2018 0.6  0.1 - 1.0 K/uL Final  . Eosinophils Relative 04/19/2018 1  % Final  . Eosinophils Absolute 04/19/2018 0.2  0.0 - 0.5 K/uL Final  . Basophils Relative 04/19/2018 0  % Final  . Basophils Absolute 04/19/2018 0.0  0.0 - 0.1 K/uL Final  . Immature Granulocytes 04/19/2018 0  % Final  . Abs Immature Granulocytes 04/19/2018 0.03  0.00 - 0.07 K/uL Final   Performed at [facility], [address], [place], [place] [address]    Assessment:  COSIMA PRENTISS is a 63 y.o. female with mild leukocytosis and thrombocytosis.  WBC has ranged between 10,300 - 13,900 since 05/2014.  Platelet count has ranged between 389,000 - 520,000 since 05/2014.  Work-up on 09/01/2017 revealed a hematocrit 41.0, hemoglobin 13.9, MCV 86.1, platelets  468,000, WBC 17,200 with an [place] of 12,600.  Differential was unremarkable.  Normal labs included:  JAK2 V617F, JAK2 exon 12-15, CALR, MPL, BCR-ABL, ferritin (54), and sed rate (30).  Symptomatically, she is feeling better.  She denies any B symptoms.  She has lost weight  intentionally.  Exam reveals no adenopathy or hepatosplenomegaly.  WBC is 11,400.  Sed rate is 57.  Plan: 1.   Labs today:  CBC with diff, CMP, ferritin, sed rate. 2.   Leukocytosis  Mild persistent leukocytosis appears reactive.  Differential predominantly neutrophils.  Sedimentation rate remains elevated.  No localizing signs or symptoms. 3.   Thrombocytosis  Platelet count 481,000.  Etiology also felt reactive. 4.   Abdominal ultrasound:  assess spleen size. 5.   RTC in 6 months got MD assessment and labs (CBC with diff, CMP, ferritin, sed rate).   Honor Loh, NP  04/19/2018, 10:48 AM   I saw and evaluated the patient, participating in the key portions of the service and reviewing pertinent diagnostic studies and records.  I reviewed the nurse practitioner's note and agree with the findings and the plan.  The assessment and plan were discussed with the patient.  A few questions were asked by the patient and answered.   Nolon Stalls, MD 04/19/2018,10:48 AM

## 2018-04-19 ENCOUNTER — Telehealth: Payer: Self-pay

## 2018-04-19 ENCOUNTER — Encounter: Payer: Self-pay | Admitting: Hematology and Oncology

## 2018-04-19 ENCOUNTER — Inpatient Hospital Stay: Payer: BLUE CROSS/BLUE SHIELD | Attending: Hematology and Oncology

## 2018-04-19 ENCOUNTER — Inpatient Hospital Stay: Payer: BLUE CROSS/BLUE SHIELD | Admitting: Hematology and Oncology

## 2018-04-19 VITALS — BP 114/73 | HR 70 | Temp 98.1°F | Resp 16 | Wt 254.9 lb

## 2018-04-19 DIAGNOSIS — R5382 Chronic fatigue, unspecified: Secondary | ICD-10-CM | POA: Insufficient documentation

## 2018-04-19 DIAGNOSIS — I10 Essential (primary) hypertension: Secondary | ICD-10-CM | POA: Diagnosis not present

## 2018-04-19 DIAGNOSIS — Z79899 Other long term (current) drug therapy: Secondary | ICD-10-CM | POA: Insufficient documentation

## 2018-04-19 DIAGNOSIS — M25562 Pain in left knee: Secondary | ICD-10-CM | POA: Insufficient documentation

## 2018-04-19 DIAGNOSIS — D473 Essential (hemorrhagic) thrombocythemia: Secondary | ICD-10-CM | POA: Diagnosis not present

## 2018-04-19 DIAGNOSIS — R7989 Other specified abnormal findings of blood chemistry: Secondary | ICD-10-CM | POA: Insufficient documentation

## 2018-04-19 DIAGNOSIS — Z800 Family history of malignant neoplasm of digestive organs: Secondary | ICD-10-CM | POA: Insufficient documentation

## 2018-04-19 DIAGNOSIS — G8929 Other chronic pain: Secondary | ICD-10-CM

## 2018-04-19 DIAGNOSIS — Z803 Family history of malignant neoplasm of breast: Secondary | ICD-10-CM | POA: Diagnosis not present

## 2018-04-19 DIAGNOSIS — E559 Vitamin D deficiency, unspecified: Secondary | ICD-10-CM

## 2018-04-19 DIAGNOSIS — M25561 Pain in right knee: Secondary | ICD-10-CM | POA: Diagnosis not present

## 2018-04-19 DIAGNOSIS — D75839 Thrombocytosis, unspecified: Secondary | ICD-10-CM

## 2018-04-19 DIAGNOSIS — F418 Other specified anxiety disorders: Secondary | ICD-10-CM | POA: Diagnosis not present

## 2018-04-19 DIAGNOSIS — D72829 Elevated white blood cell count, unspecified: Secondary | ICD-10-CM

## 2018-04-19 DIAGNOSIS — Z791 Long term (current) use of non-steroidal anti-inflammatories (NSAID): Secondary | ICD-10-CM | POA: Insufficient documentation

## 2018-04-19 DIAGNOSIS — Z7984 Long term (current) use of oral hypoglycemic drugs: Secondary | ICD-10-CM | POA: Diagnosis not present

## 2018-04-19 LAB — COMPREHENSIVE METABOLIC PANEL
ALT: 15 U/L (ref 0–44)
AST: 16 U/L (ref 15–41)
Albumin: 4.2 g/dL (ref 3.5–5.0)
Alkaline Phosphatase: 95 U/L (ref 38–126)
Anion gap: 14 (ref 5–15)
BUN: 21 mg/dL (ref 8–23)
CO2: 23 mmol/L (ref 22–32)
Calcium: 9.2 mg/dL (ref 8.9–10.3)
Chloride: 102 mmol/L (ref 98–111)
Creatinine, Ser: 0.93 mg/dL (ref 0.44–1.00)
GFR calc Af Amer: >60 mL/min (ref >60)
GFR calc non Af Amer: >60 mL/min (ref >60)
Glucose, Bld: 153 mg/dL — ABNORMAL HIGH (ref 70–99)
Potassium: 3.5 mmol/L (ref 3.5–5.1)
Sodium: 139 mmol/L (ref 135–145)
Total Bilirubin: 0.8 mg/dL (ref 0.3–1.2)
Total Protein: 7.5 g/dL (ref 6.5–8.1)

## 2018-04-19 LAB — CBC WITH DIFFERENTIAL/PLATELET
Abs Immature Granulocytes: 0.03 10*3/uL (ref 0.00–0.07)
Basophils Absolute: 0 10*3/uL (ref 0.0–0.1)
Basophils Relative: 0 %
Eosinophils Absolute: 0.2 10*3/uL (ref 0.0–0.5)
Eosinophils Relative: 1 %
HCT: 38.2 % (ref 36.0–46.0)
Hemoglobin: 12.7 g/dL (ref 12.0–15.0)
Immature Granulocytes: 0 %
Lymphocytes Relative: 19 %
Lymphs Abs: 2.1 10*3/uL (ref 0.7–4.0)
MCH: 30.5 pg (ref 26.0–34.0)
MCHC: 33.2 g/dL (ref 30.0–36.0)
MCV: 91.6 fL (ref 80.0–100.0)
Monocytes Absolute: 0.6 10*3/uL (ref 0.1–1.0)
Monocytes Relative: 6 %
Neutro Abs: 8.5 10*3/uL — ABNORMAL HIGH (ref 1.7–7.7)
Neutrophils Relative %: 74 %
Platelets: 481 10*3/uL — ABNORMAL HIGH (ref 150–400)
RBC: 4.17 MIL/uL (ref 3.87–5.11)
RDW: 14.3 % (ref 11.5–15.5)
WBC: 11.4 10*3/uL — ABNORMAL HIGH (ref 4.0–10.5)
nRBC: 0 % (ref 0.0–0.2)

## 2018-04-19 LAB — FERRITIN: Ferritin: 39 ng/mL (ref 11–307)

## 2018-04-19 LAB — SEDIMENTATION RATE: Sed Rate: 57 mm/hr — ABNORMAL HIGH (ref 0–30)

## 2018-04-19 NOTE — Telephone Encounter (Signed)
Left VM regarding Ferritin and Sed Rate Levels. Advised patient to increase Iron Rich Foods such as breakfast cereals, white beans, dark chocolate, soy beans, lentils, spinach, etc. Informed patient Dr. Mike Gip would like to recheck labs in 3 months and telephone number provided if she has any further questions.

## 2018-04-19 NOTE — Progress Notes (Signed)
Pt here for follow up. Denies any concerns at this time.  

## 2018-04-19 NOTE — Telephone Encounter (Signed)
-----   Message from Karen Kitchens, NP sent at 04/19/2018  1:20 PM EST -----  ----- Message ----- From: Lequita Asal, MD Sent: 04/19/2018   1:01 PM EST To: Karen Kitchens, NP   Please call patient.  Sed rate higher.  Ferritin borderline.  Discuss iron rich foods.  Recheck CBC, ferritin, and sed rate in 3 months.  ----- Message ----- From: Interface, [facility] [place] Sent: 04/19/2018   9:57 AM EST To: Lequita Asal, MD

## 2018-04-21 ENCOUNTER — Other Ambulatory Visit: Payer: Self-pay

## 2018-04-21 DIAGNOSIS — D72829 Elevated white blood cell count, unspecified: Secondary | ICD-10-CM

## 2018-04-21 DIAGNOSIS — D473 Essential (hemorrhagic) thrombocythemia: Secondary | ICD-10-CM

## 2018-04-21 DIAGNOSIS — D75839 Thrombocytosis, unspecified: Secondary | ICD-10-CM

## 2018-04-30 ENCOUNTER — Telehealth: Payer: Self-pay | Admitting: Family Medicine

## 2018-04-30 ENCOUNTER — Other Ambulatory Visit: Payer: Self-pay | Admitting: Family Medicine

## 2018-04-30 NOTE — Telephone Encounter (Signed)
Copied from [place] [id number]. Topic: Quick Communication - Rx Refill/Question >> Apr 30, 2018  4:28 PM Windy Kalata wrote: Medication: metFORMIN (GLUCOPHAGE) 500 MG   Has the patient contacted their pharmacy? Yes.   (Agent: If no, request that the patient contact the pharmacy for the refill.) (Agent: If yes, when and what did the pharmacy advise?) Pharmacy states to call office as the instructions are not clear  Preferred Pharmacy (with phone number or street name): [place], [place] [facility] [address] [phone] (Phone) [phone] (Fax)    Agent: Please be advised that RX refills may take up to 3 business days. We ask that you follow-up with your pharmacy.

## 2018-05-04 ENCOUNTER — Telehealth: Payer: Self-pay

## 2018-05-04 MED ORDER — METFORMIN HCL 1000 MG PO TABS: 1000.0000 mg | ORAL_TABLET | Freq: Two times a day (BID) | ORAL | 1 refills | Status: DC

## 2018-05-04 NOTE — Telephone Encounter (Signed)
Copied from [place] [id number]. Topic: General - Other >> May 04, 2018 10:40 AM Carolyn Stare wrote:  Pt call to say the RX that was called in was incorrect, pt said her med was increased to 4 pills a day,  2 pills in the morning and 2 pills in the evening. Req a new RX be sent to the pharmacy   metFORMIN (GLUCOPHAGE) 500 MG tablet   [facility]   **I have sent correct dose for 100 mg 2x daily to [facility].**

## 2018-06-16 ENCOUNTER — Ambulatory Visit: Payer: BLUE CROSS/BLUE SHIELD | Admitting: Family Medicine

## 2018-06-22 ENCOUNTER — Encounter: Payer: BLUE CROSS/BLUE SHIELD | Admitting: Obstetrics and Gynecology

## 2018-07-21 ENCOUNTER — Other Ambulatory Visit: Payer: BLUE CROSS/BLUE SHIELD

## 2018-07-23 ENCOUNTER — Other Ambulatory Visit: Payer: Self-pay | Admitting: Family Medicine

## 2018-07-30 ENCOUNTER — Ambulatory Visit: Payer: BLUE CROSS/BLUE SHIELD | Admitting: Family Medicine

## 2018-08-04 ENCOUNTER — Telehealth: Payer: Self-pay | Admitting: *Deleted

## 2018-08-04 DIAGNOSIS — E041 Nontoxic single thyroid nodule: Secondary | ICD-10-CM

## 2018-08-04 DIAGNOSIS — E785 Hyperlipidemia, unspecified: Secondary | ICD-10-CM

## 2018-08-04 DIAGNOSIS — E1169 Type 2 diabetes mellitus with other specified complication: Secondary | ICD-10-CM

## 2018-08-04 DIAGNOSIS — I10 Essential (primary) hypertension: Secondary | ICD-10-CM

## 2018-08-04 NOTE — Telephone Encounter (Signed)
Please verify what labs pt need for lab appt on 5/22. Thanks

## 2018-08-06 ENCOUNTER — Other Ambulatory Visit: Payer: BLUE CROSS/BLUE SHIELD

## 2018-08-10 ENCOUNTER — Ambulatory Visit: Payer: BLUE CROSS/BLUE SHIELD | Admitting: Family Medicine

## 2018-08-11 ENCOUNTER — Other Ambulatory Visit: Payer: Self-pay

## 2018-08-11 ENCOUNTER — Other Ambulatory Visit (INDEPENDENT_AMBULATORY_CARE_PROVIDER_SITE_OTHER): Payer: BLUE CROSS/BLUE SHIELD

## 2018-08-11 DIAGNOSIS — E785 Hyperlipidemia, unspecified: Secondary | ICD-10-CM

## 2018-08-11 DIAGNOSIS — E1169 Type 2 diabetes mellitus with other specified complication: Secondary | ICD-10-CM

## 2018-08-11 DIAGNOSIS — E041 Nontoxic single thyroid nodule: Secondary | ICD-10-CM

## 2018-08-11 NOTE — Addendum Note (Signed)
Addended by: Leeanne Rio on: 08/11/2018 10:39 AM   Modules accepted: Orders

## 2018-08-12 LAB — HEMOGLOBIN A1C
Est. average glucose Bld gHb Est-mCnc: 151 mg/dL
Hgb A1c MFr Bld: 6.9 % — ABNORMAL HIGH (ref 4.8–5.6)

## 2018-08-12 LAB — COMPREHENSIVE METABOLIC PANEL
ALT: 13 IU/L (ref 0–32)
AST: 13 IU/L (ref 0–40)
Albumin/Globulin Ratio: 2.2 (ref 1.2–2.2)
Albumin: 4.4 g/dL (ref 3.8–4.8)
Alkaline Phosphatase: 99 IU/L (ref 39–117)
BUN/Creatinine Ratio: 21 (ref 12–28)
BUN: 21 mg/dL (ref 8–27)
Bilirubin Total: 0.7 mg/dL (ref 0.0–1.2)
CO2: 23 mmol/L (ref 20–29)
Calcium: 9.8 mg/dL (ref 8.7–10.3)
Chloride: 94 mmol/L — ABNORMAL LOW (ref 96–106)
Creatinine, Ser: 1.02 mg/dL — ABNORMAL HIGH (ref 0.57–1.00)
GFR calc Af Amer: 68 mL/min/{1.73_m2} (ref >59)
GFR calc non Af Amer: 59 mL/min/{1.73_m2} — ABNORMAL LOW (ref >59)
Globulin, Total: 2 g/dL (ref 1.5–4.5)
Glucose: 133 mg/dL — ABNORMAL HIGH (ref 65–99)
Potassium: 5.1 mmol/L (ref 3.5–5.2)
Sodium: 143 mmol/L (ref 134–144)
Total Protein: 6.4 g/dL (ref 6.0–8.5)

## 2018-08-12 LAB — LIPID PANEL
Chol/HDL Ratio: 3 ratio (ref 0.0–4.4)
Cholesterol, Total: 121 mg/dL (ref 100–199)
HDL: 41 mg/dL (ref >39)
LDL Calculated: 42 mg/dL (ref 0–99)
Triglycerides: 190 mg/dL — ABNORMAL HIGH (ref 0–149)
VLDL Cholesterol Cal: 38 mg/dL (ref 5–40)

## 2018-08-12 LAB — TSH: TSH: 1.15 u[IU]/mL (ref 0.450–4.500)

## 2018-08-13 ENCOUNTER — Encounter: Payer: Self-pay | Admitting: Family Medicine

## 2018-08-13 ENCOUNTER — Other Ambulatory Visit: Payer: Self-pay

## 2018-08-13 ENCOUNTER — Ambulatory Visit (INDEPENDENT_AMBULATORY_CARE_PROVIDER_SITE_OTHER): Payer: BLUE CROSS/BLUE SHIELD | Admitting: Family Medicine

## 2018-08-13 DIAGNOSIS — R61 Generalized hyperhidrosis: Secondary | ICD-10-CM | POA: Diagnosis not present

## 2018-08-13 DIAGNOSIS — I10 Essential (primary) hypertension: Secondary | ICD-10-CM

## 2018-08-13 DIAGNOSIS — R232 Flushing: Secondary | ICD-10-CM | POA: Insufficient documentation

## 2018-08-13 DIAGNOSIS — E1169 Type 2 diabetes mellitus with other specified complication: Secondary | ICD-10-CM

## 2018-08-13 DIAGNOSIS — E669 Obesity, unspecified: Secondary | ICD-10-CM | POA: Diagnosis not present

## 2018-08-13 NOTE — Assessment & Plan Note (Addendum)
Patient with daytime sweating.  I suspect this is driven by her venlafaxine.  We will check with our clinical pharmacist on the best way to get the patient off of this medication and then see if her sweating improves.  If it does not we will consider alternative causes.  The patient notes no current issues that would require continuing venlafaxine.  She will monitor for anxiety and depression when coming off of this and if they recur she will let us know so we can try an alternative medication.

## 2018-08-13 NOTE — Assessment & Plan Note (Signed)
Adequately controlled.  Continue current regimen. 

## 2018-08-13 NOTE — Assessment & Plan Note (Signed)
Recently well controlled during a visit with hematology.  She will continue her current medication.  Follow-up in 3 to 4 months.

## 2018-08-13 NOTE — Progress Notes (Signed)
Virtual Visit via video Note  This visit type was conducted due to national recommendations for restrictions regarding the COVID-19 pandemic (e.g. social distancing).  This format is felt to be most appropriate for this patient at this time.  All issues noted in this document were discussed and addressed.  No physical exam was performed (except for noted visual exam findings with Video Visits).   I connected with Shelby Estes today at  3:30 PM EDT by a video enabled telemedicine application and verified that I am speaking with the correct person using two identifiers. Location patient: home Location provider: work Persons participating in the virtual visit: patient, provider  I discussed the limitations, risks, security and privacy concerns of performing an evaluation and management service by telephone and the availability of in person appointments. I also discussed with the patient that there may be a patient responsible charge related to this service. The patient expressed understanding and agreed to proceed.   Reason for visit: Follow-up.  HPI: Sweating: Patient reports for a number of years she has had issues where she will just break out in a sweat.  The back of her head will get soaked.  She feels as though her whole body will get soaked.  It kind of feels like a hot flash from being postmenopausal though she does not have the heat sensation.  She notes no sweating at night.  She is on venlafaxine which can cause diaphoresis.  She has checked her blood sugar when this occurs and it is not been excessively high or low.  She notes no weight loss.  Diabetes: Averaging 150-155.  Taking Jardiance.  Taking metformin.  No polyuria or polydipsia.  No hypoglycemia.  She has stopped drinking soda.  Hypertension: Taking losartan, HCTZ, and metoprolol.  No chest pain, shortness of breath, or edema.   ROS: See pertinent positives and negatives per HPI.  Past Medical History:  Diagnosis Date   . Anxiety   . Depression   . Diabetes mellitus 2010  . Elevated BP   . GERD (gastroesophageal reflux disease)   . Hypertension   . Kidney stone    Dr. Cyndie Mull, April 2014  . Renal cyst    Dr. Cyndie Mull  . SVT (supraventricular tachycardia) [facility])    s/p cardioversion  . Vitamin D deficiency     Past Surgical History:  Procedure Laterality Date  . ABDOMINAL HYSTERECTOMY  09/27/13   Dr. Veryl Speak  . Arm Surgery     due to fracture left elbow  . CHOLECYSTECTOMY  5/07   Dr Bary Castilla  . COLONOSCOPY  2007   Dr Bary Castilla  . COLONOSCOPY WITH PROPOFOL N/A 06/20/2015   Procedure: COLONOSCOPY WITH PROPOFOL;  Surgeon: [name], MD;  Location: [facility] ENDOSCOPY;  Service: Endoscopy;  Laterality: N/A;  . TUBAL LIGATION  02-21-01    Family History  Problem Relation Age of Onset  . COPD Father   . Stroke Father   . Diabetes Mother        borderline  . Hypertension Mother   . Thyroid disease Mother   . Breast cancer Maternal Aunt   . Seizures Maternal Grandmother   . Breast cancer Other   . Cancer Other        breast  . Colon cancer Sister        29  . Colon cancer Cousin        56's    SOCIAL HX: Non-smoker.   Current Outpatient Medications:  .  furosemide (LASIX) 20 MG tablet, , Disp: , Rfl:  .  hydrochlorothiazide (HYDRODIURIL) 12.5 MG tablet, hydrochlorothiazide 12.5 mg tablet, Disp: , Rfl:  .  JARDIANCE 10 MG TABS tablet, TAKE 1 TABLET BY MOUTH DAILY, Disp: 30 tablet, Rfl: 3 .  losartan (COZAAR) 100 MG tablet, losartan 100 mg tablet, Disp: , Rfl:  .  Melatonin Gummies 2.5 MG CHEW, Chew 1 tablet by mouth as needed., Disp: , Rfl:  .  meloxicam (MOBIC) 15 MG tablet, Take 15 mg by mouth daily. , Disp: , Rfl:  .  metFORMIN (GLUCOPHAGE) 1000 MG tablet, Take 1 tablet (1,000 mg total) by mouth 2 (two) times daily with a meal., Disp: 180 tablet, Rfl: 1 .  metoprolol tartrate (LOPRESSOR) 25 MG tablet, Take 1 tablet (25 mg total) by mouth 2 (two) times  daily., Disp: 60 tablet, Rfl: 6 .  pantoprazole (PROTONIX) 40 MG tablet, Take 1 tablet (40 mg total) by mouth daily., Disp: 30 tablet, Rfl: 3 .  rosuvastatin (CRESTOR) 10 MG tablet, Take 1 tablet (10 mg total) by mouth daily., Disp: 90 tablet, Rfl: 3 .  venlafaxine XR (EFFEXOR-XR) 75 MG 24 hr capsule, TAKE 1 CAPSULE BY MOUTH EVERY DAY WITH BREAKFAST, Disp: 90 capsule, Rfl: 1 .  Cholecalciferol (VITAMIN D) 2000 UNITS CAPS, Take 1 capsule by mouth daily., Disp: , Rfl:  .  ranitidine (ZANTAC) 150 MG tablet, Take 150 mg by mouth 2 (two) times daily., Disp: , Rfl:   EXAM:  VITALS per patient if applicable: None.  GENERAL: alert, oriented, appears well and in no acute distress  HEENT: atraumatic, conjunttiva clear, no obvious abnormalities on inspection of external nose and ears  NECK: normal movements of the head and neck  LUNGS: on inspection no signs of respiratory distress, breathing rate appears normal, no obvious gross SOB, gasping or wheezing  CV: no obvious cyanosis  MS: moves all visible extremities without noticeable abnormality  PSYCH/NEURO: pleasant and cooperative, no obvious depression or anxiety, speech and thought processing grossly intact  ASSESSMENT AND PLAN:  Discussed the following assessment and plan:  Essential hypertension  Diabetes mellitus type 2 in obese (HCC)  Excessive sweating  Hypertension Recently well controlled during a visit with hematology.  She will continue her current medication.  Follow-up in 3 to 4 months.  Diabetes mellitus type 2 in obese Adequately controlled.  Continue current regimen.  Excessive sweating Patient with daytime sweating.  I suspect this is driven by her venlafaxine.  We will check with our clinical pharmacist on the best way to get the patient off of this medication and then see if her sweating improves.  If it does not we will consider alternative causes.  The patient notes no current issues that would require continuing  venlafaxine.  She will monitor for anxiety and depression when coming off of this and if they recur she will let us know so we can try an alternative medication.  [facility] office staff will contact the patient to schedule follow-up in 3 to 4 months.   I discussed the assessment and treatment plan with the patient. The patient was provided an opportunity to ask questions and all were answered. The patient agreed with the plan and demonstrated an understanding of the instructions.   The patient was advised to call back or seek an in-person evaluation if the symptoms worsen or if the condition fails to improve as anticipated.    Tommi Rumps, MD

## 2018-08-16 ENCOUNTER — Other Ambulatory Visit: Payer: Self-pay | Admitting: Family Medicine

## 2018-08-16 ENCOUNTER — Telehealth: Payer: Self-pay | Admitting: Family Medicine

## 2018-08-16 MED ORDER — VENLAFAXINE HCL ER 37.5 MG PO CP24: 37.5000 mg | ORAL_CAPSULE | Freq: Every day | ORAL | 0 refills | Status: DC

## 2018-08-16 NOTE — Telephone Encounter (Signed)
Please let the patient know I heard back from our clinical pharmacist regarding the venlafaxine.  The patient will need to decrease the dose for 4 weeks.  I sent in a lower dose to her pharmacy of venlafaxine 37.5 mg tablets.  She should start on this and finish that prescription of medication and then she can discontinue the venlafaxine.

## 2018-08-16 NOTE — Telephone Encounter (Signed)
Patient has been notified

## 2018-08-16 NOTE — Telephone Encounter (Signed)
-----   Message from De Hollingshead, [facility] sent at 08/16/2018  8:03 AM EDT ----- Marykay Lex!  I'd taper this, yeah. Venlafaxine has a higher risk of withdrawal symptoms, in comparison to duloxetine or most SSRIs (except paroxetine). I'd recommend 37.5 mg daily x 3-4 weeks, then stop.   Catie ----- Message ----- From: Leone Haven, MD Sent: 08/13/2018   4:56 PM EDT To: De Hollingshead, [facility]  Hey Catie,   I want to taper this patient off venlafaxine. What is the best way to do this? Half the dose for 2 weeks then discontinue? Or a slower taper? Or no taper at all?  Please let me know your thoughts.  Randall Hiss

## 2018-08-31 ENCOUNTER — Other Ambulatory Visit: Payer: Self-pay | Admitting: Family Medicine

## 2018-09-13 ENCOUNTER — Inpatient Hospital Stay: Payer: BLUE CROSS/BLUE SHIELD | Attending: Hematology and Oncology

## 2018-09-13 ENCOUNTER — Other Ambulatory Visit: Payer: Self-pay

## 2018-09-13 DIAGNOSIS — D473 Essential (hemorrhagic) thrombocythemia: Secondary | ICD-10-CM

## 2018-09-13 DIAGNOSIS — D72829 Elevated white blood cell count, unspecified: Secondary | ICD-10-CM | POA: Insufficient documentation

## 2018-09-13 DIAGNOSIS — D75839 Thrombocytosis, unspecified: Secondary | ICD-10-CM

## 2018-09-13 LAB — CBC WITH DIFFERENTIAL/PLATELET
Abs Immature Granulocytes: 0.05 10*3/uL (ref 0.00–0.07)
Basophils Absolute: 0.1 10*3/uL (ref 0.0–0.1)
Basophils Relative: 1 %
Eosinophils Absolute: 0.2 10*3/uL (ref 0.0–0.5)
Eosinophils Relative: 2 %
HCT: 39 % (ref 36.0–46.0)
Hemoglobin: 12.9 g/dL (ref 12.0–15.0)
Immature Granulocytes: 0 %
Lymphocytes Relative: 24 %
Lymphs Abs: 2.8 10*3/uL (ref 0.7–4.0)
MCH: 30.3 pg (ref 26.0–34.0)
MCHC: 33.1 g/dL (ref 30.0–36.0)
MCV: 91.5 fL (ref 80.0–100.0)
Monocytes Absolute: 0.7 10*3/uL (ref 0.1–1.0)
Monocytes Relative: 6 %
Neutro Abs: 7.9 10*3/uL — ABNORMAL HIGH (ref 1.7–7.7)
Neutrophils Relative %: 67 %
Platelets: 436 10*3/uL — ABNORMAL HIGH (ref 150–400)
RBC: 4.26 MIL/uL (ref 3.87–5.11)
RDW: 14.2 % (ref 11.5–15.5)
WBC: 11.6 10*3/uL — ABNORMAL HIGH (ref 4.0–10.5)
nRBC: 0 % (ref 0.0–0.2)

## 2018-09-13 LAB — SEDIMENTATION RATE: Sed Rate: 43 mm/hr — ABNORMAL HIGH (ref 0–30)

## 2018-09-13 LAB — FERRITIN: Ferritin: 26 ng/mL (ref 11–307)

## 2018-09-14 ENCOUNTER — Encounter: Payer: Self-pay | Admitting: Cardiovascular Disease

## 2018-09-14 ENCOUNTER — Telehealth: Payer: Self-pay

## 2018-09-14 NOTE — Telephone Encounter (Signed)
-----   Message from Secundino Ginger sent at 09/14/2018  1:39 PM EDT ----- Regarding: lab results Contact: [phone] This patient would like her blood results from 6/29

## 2018-09-14 NOTE — Telephone Encounter (Signed)
Reviewed labs with patient. Advised Dr. Mike Gip may have further instruction after reviewing Ferritin. Patient verbalizes understanding and denies any further questions.

## 2018-09-29 LAB — HM MAMMOGRAPHY

## 2018-10-13 ENCOUNTER — Ambulatory Visit: Payer: BLUE CROSS/BLUE SHIELD

## 2018-10-15 ENCOUNTER — Other Ambulatory Visit: Payer: Self-pay

## 2018-10-15 NOTE — Progress Notes (Signed)
[facility]  [address] [place], [place] [address] Phone: [phone]  Fax: [phone]   Clinic Day:  10/18/2018  Referring physician: Leone Haven, MD  Chief Complaint: Shelby Estes is a 63 y.o. female with leukocytosis who is seen for 6 month assessment.   HPI: The patient was last seen in the hematology clinic on 04/19/2018. At that time, she was feeling better.  She denied any B symptoms.  She had lost weight intentionally.  Exam revealed no adenopathy or hepatosplenomegaly. WBC was 11,400.  Sed rate was 57.  Abdomen and pelvis CT on 04/23/2018 at [facility] revealed hepatic steatosis, hiatal hernia, bilateral renal cysts, and normal-appearing spleen.   Bilateral screening mammogram on 09/29/2018 showed no evidence of malignancy.   Labs on 09/13/2018: hematocrit 39.0, hemoglobin 12.9, platelets 436,000, WBC 11,600, ANC 7,900. Sed rate 43. Ferritin 26.  During the interim, she feels "about the same." She denies any infections, fevers, sweats, or weight loss. She denies any chest pain or shortness of breath. She reports chronic knee pain, for which she recieved a steroid injection in 05/2018. Her carpal tunnel is stable, present only with overuse.    Past Medical History:  Diagnosis Date   Anxiety    Depression    Diabetes mellitus 2010   Elevated BP    GERD (gastroesophageal reflux disease)    Hypertension    Kidney stone    Dr. Cyndie Mull, April 2014   Renal cyst    Dr. Daniels-urologist   SVT (supraventricular tachycardia) [facility])    s/p cardioversion   Vitamin D deficiency     Past Surgical History:  Procedure Laterality Date   ABDOMINAL HYSTERECTOMY  09/27/13   Dr. Veryl Speak   Arm Surgery     due to fracture left elbow   CHOLECYSTECTOMY  5/07   Dr Bary Castilla   COLONOSCOPY  2007   Dr Bary Castilla   COLONOSCOPY WITH PROPOFOL N/A 06/20/2015   Procedure: COLONOSCOPY WITH PROPOFOL;   Surgeon: [name], MD;  Location: [facility]COPY;  Service: Endoscopy;  Laterality: N/A;   TUBAL LIGATION  02-21-01    Family History  Problem Relation Age of Onset   COPD Father    Stroke Father    Diabetes Mother        borderline   Hypertension Mother    Thyroid disease Mother    Breast cancer Maternal Aunt    Seizures Maternal Grandmother    Breast cancer Other    Cancer Other        breast   Colon cancer Sister        15   Colon cancer Cousin        22's    Social History:  reports that she has never smoked. She has never used smokeless tobacco. She reports that she does not drink alcohol or use drugs.She retired in 09/2016. She previously worked at [employer] in [place]. She lives in [place]. Her husband's name is Jerrye Beavers. She is alone today.  Allergies:  Allergies  Allergen Reactions   Bupropion     Muscle aches    Clarithromycin    Enalapril     edema   Phentermine     SVT   Ceclor [Cefaclor] Swelling and Rash   Penicillins Swelling and Rash   Sulfa Antibiotics Swelling and Rash    Current Medications: Current Outpatient Medications  Medication Sig Dispense Refill   Cholecalciferol (VITAMIN D) 2000 UNITS CAPS Take  1 capsule by mouth daily.     furosemide (LASIX) 20 MG tablet      hydrochlorothiazide (HYDRODIURIL) 12.5 MG tablet hydrochlorothiazide 12.5 mg tablet     JARDIANCE 10 MG TABS tablet TAKE 1 TABLET BY MOUTH DAILY 30 tablet 3   losartan (COZAAR) 100 MG tablet losartan 100 mg tablet     Melatonin Gummies 2.5 MG CHEW Chew 1 tablet by mouth as needed.     meloxicam (MOBIC) 15 MG tablet Take 15 mg by mouth daily.      metFORMIN (GLUCOPHAGE) 1000 MG tablet Take 1 tablet (1,000 mg total) by mouth 2 (two) times daily with a meal. 180 tablet 1   metoprolol tartrate (LOPRESSOR) 25 MG tablet Take 1 tablet (25 mg total) by mouth 2 (two) times daily. 60 tablet 6   pantoprazole (PROTONIX) 40 MG tablet Take 1 tablet (40  mg total) by mouth daily. 30 tablet 3   ranitidine (ZANTAC) 150 MG tablet Take 150 mg by mouth 2 (two) times daily.     rosuvastatin (CRESTOR) 10 MG tablet TAKE ONE TABLET BY MOUTH DAILY 90 tablet 3   venlafaxine XR (EFFEXOR XR) 37.5 MG 24 hr capsule Take 1 capsule (37.5 mg total) by mouth daily with breakfast. 30 capsule 0   No current facility-administered medications for this visit.     Review of Systems  Constitutional: Negative for chills, diaphoresis, fever, malaise/fatigue and weight loss (up 7lbs).       Feels "about the same."   HENT: Negative.  Negative for congestion, hearing loss and sinus pain.   Eyes: Negative.  Negative for blurred vision.  Respiratory: Negative for cough, shortness of breath and wheezing.   Cardiovascular: Negative.  Negative for chest pain, palpitations, claudication, leg swelling and PND.  Gastrointestinal: Negative for abdominal pain, blood in stool, constipation, diarrhea, heartburn, melena, nausea and vomiting.  Genitourinary: Negative.  Negative for dysuria, frequency, hematuria and urgency.  Musculoskeletal: Positive for joint pain (bilateral knee pain). Negative for back pain and myalgias.  Skin: Negative.  Negative for rash.  Neurological: Negative.  Negative for dizziness, tingling, sensory change, weakness and headaches.       Carpal tunnel.  Endo/Heme/Allergies: Negative.  Does not bruise/bleed easily.       Diabetes.  Thyroid cysts.   Psychiatric/Behavioral: Negative.  Negative for depression, memory loss and substance abuse. The patient is not nervous/anxious and does not have insomnia.   All other systems reviewed and are negative.  Performance status (ECOG): 1  Vitals Blood pressure 121/79, pulse 65, temperature (!) 97.1 F (36.2 C), temperature source Tympanic, resp. rate 18, height 5\' 3"  (1.6 m), weight 261 lb 0.4 oz (118.4 kg), SpO2 100 %.   Physical Exam  Constitutional: She is oriented to person, place, and time. She appears  well-developed and well-nourished. No distress.  HENT:  Head: Normocephalic and atraumatic.  Mouth/Throat: Oropharynx is clear and moist. No oropharyngeal exudate.  Short dark blonde hair.  Mask.  Eyes: Pupils are equal, round, and reactive to light. Conjunctivae and EOM are normal. No scleral icterus.  Glasses.  Blue eyes.  Neck: Normal range of motion. Neck supple.  Cardiovascular: Normal rate, regular rhythm and normal heart sounds.  No murmur heard. Pulmonary/Chest: Effort normal and breath sounds normal. No respiratory distress. She has no wheezes.  Abdominal: Soft. Bowel sounds are normal. She exhibits no distension. There is no abdominal tenderness.  Musculoskeletal: Normal range of motion.        General: No edema.  Lymphadenopathy:    She has no cervical adenopathy.    She has no axillary adenopathy.       Right: No supraclavicular adenopathy present.       Left: No supraclavicular adenopathy present.  Neurological: She is alert and oriented to person, place, and time.  Skin: Skin is warm and dry. She is not diaphoretic. No erythema.  Psychiatric: She has a normal mood and affect. Her behavior is normal. Judgment and thought content normal.  Nursing note and vitals reviewed.   Appointment on 10/18/2018  Component Date Value Ref Range Status   Sodium 10/18/2018 138  135 - 145 mmol/L Final   Potassium 10/18/2018 3.7  3.5 - 5.1 mmol/L Final   Chloride 10/18/2018 103  98 - 111 mmol/L Final   CO2 10/18/2018 25  22 - 32 mmol/L Final   Glucose, Bld 10/18/2018 159* 70 - 99 mg/dL Final   BUN 10/18/2018 21  8 - 23 mg/dL Final   Creatinine, Ser 10/18/2018 1.00  0.44 - 1.00 mg/dL Final   Calcium 10/18/2018 9.4  8.9 - 10.3 mg/dL Final   Total Protein 10/18/2018 7.3  6.5 - 8.1 g/dL Final   Albumin 10/18/2018 3.8  3.5 - 5.0 g/dL Final   AST 10/18/2018 16  15 - 41 U/L Final   ALT 10/18/2018 13  0 - 44 U/L Final   Alkaline Phosphatase 10/18/2018 93  38 - 126 U/L Final    Total Bilirubin 10/18/2018 0.5  0.3 - 1.2 mg/dL Final   GFR calc non Af Amer 10/18/2018 >60  >60 mL/min Final   GFR calc Af Amer 10/18/2018 >60  >60 mL/min Final   Anion gap 10/18/2018 10  5 - 15 Final   Performed at [facility], [address], [place], [place] [address]   Sed Rate 10/18/2018 43* 0 - 30 mm/hr Final   Performed at [facility], [address], [place], [place] [address]   WBC 10/18/2018 12.1* 4.0 - 10.5 K/uL Final   RBC 10/18/2018 4.37  3.87 - 5.11 MIL/uL Final   Hemoglobin 10/18/2018 12.8  12.0 - 15.0 g/dL Final   HCT 10/18/2018 39.2  36.0 - 46.0 % Final   MCV 10/18/2018 89.7  80.0 - 100.0 fL Final   MCH 10/18/2018 29.3  26.0 - 34.0 pg Final   MCHC 10/18/2018 32.7  30.0 - 36.0 g/dL Final   RDW 10/18/2018 13.7  11.5 - 15.5 % Final   Platelets 10/18/2018 443* 150 - 400 K/uL Final   nRBC 10/18/2018 0.0  0.0 - 0.2 % Final   Neutrophils Relative % 10/18/2018 68  % Final   Neutro Abs 10/18/2018 8.2* 1.7 - 7.7 K/uL Final   Lymphocytes Relative 10/18/2018 23  % Final   Lymphs Abs 10/18/2018 2.8  0.7 - 4.0 K/uL Final   Monocytes Relative 10/18/2018 6  % Final   Monocytes Absolute 10/18/2018 0.7  0.1 - 1.0 K/uL Final   Eosinophils Relative 10/18/2018 2  % Final   Eosinophils Absolute 10/18/2018 0.2  0.0 - 0.5 K/uL Final   Basophils Relative 10/18/2018 1  % Final   Basophils Absolute 10/18/2018 0.1  0.0 - 0.1 K/uL Final   Immature Granulocytes 10/18/2018 0  % Final   Abs Immature Granulocytes 10/18/2018 0.05  0.00 - 0.07 K/uL Final   Performed at [facility], [address], [place], [place] [address]    Assessment:  Shelby Estes is a 63 y.o. female with  mild leukocytosis and thrombocytosis.  WBC has ranged between 10,300 - 13,900 since 05/2014.  Platelet count has ranged between 389,000 - 520,000 since 05/2014.  Work-up on 09/01/2017 revealed a hematocrit 41.0, hemoglobin 13.9, MCV 86.1, platelets  468,000, WBC 17,200 with an ANC of 12,600.  Differential was unremarkable.  Normal labs included:  JAK2 V617F, JAK2 exon 12-15, CALR, MPL, BCR-ABL, ferritin (54), and sed rate (30).  Abdomen and pelvis CT on 04/23/2018 revealed hepatic steatosis, hiatal hernia, bilateral renal cysts, and normal-appearing spleen.   Symptomatically, she denies any infections, fevers, sweats, or weight loss.  Exam reveals no adenopathy or hepatosplenomegaly.  WBC is 12,100.  Plan: 1.  Labs today:  CBC with diff, CMP, ferritin, sed rate. 2.   Leukocytosis             She continues to have mild persistent leukocytosis.    Suspect etiology is reactive as she is asymptomatic with prior negative testing.              Spleen size is normal.   Consider JAK2- MPD or a patient with baseline elevated WBC (5%).   Flow cytometry.             Continue to monitor. 3.   Thrombocytosis             Platelet count 443,000.             As above, etiology appears reactive.  Continue to monitor. 4.   RTC in 6 months for labs (CBC with diff). 5.   RTC in 1 year for MD assessment and labs (CBC with diff, CMP).  I discussed the assessment and treatment plan with the patient.  The patient was provided an opportunity to ask questions and all were answered.  The patient agreed with the plan and demonstrated an understanding of the instructions.  The patient was advised to call back if the symptoms worsen or if the condition fails to improve as anticipated.  I provided 20 minutes of face-to-face time during this this encounter and > 50% was spent counseling as documented under my assessment and plan.    Lequita Asal, MD, PhD    10/18/2018, 10:10 AM  I, Molly Dorshimer, am acting as Education administrator for [employer]. Mike Gip, MD, PhD.  I, Kassady Laboy C. Mike Gip, MD, have reviewed the above documentation for accuracy and completeness, and I agree with the above.

## 2018-10-18 ENCOUNTER — Other Ambulatory Visit: Payer: Self-pay

## 2018-10-18 ENCOUNTER — Encounter: Payer: Self-pay | Admitting: Hematology and Oncology

## 2018-10-18 ENCOUNTER — Inpatient Hospital Stay: Payer: BLUE CROSS/BLUE SHIELD | Admitting: Hematology and Oncology

## 2018-10-18 ENCOUNTER — Other Ambulatory Visit: Payer: BLUE CROSS/BLUE SHIELD

## 2018-10-18 ENCOUNTER — Telehealth: Payer: Self-pay

## 2018-10-18 ENCOUNTER — Inpatient Hospital Stay: Payer: BLUE CROSS/BLUE SHIELD | Attending: Hematology and Oncology

## 2018-10-18 ENCOUNTER — Ambulatory Visit: Payer: BLUE CROSS/BLUE SHIELD | Admitting: Hematology and Oncology

## 2018-10-18 VITALS — BP 121/79 | HR 65 | Temp 97.1°F | Resp 18 | Ht 63.0 in | Wt 261.0 lb

## 2018-10-18 DIAGNOSIS — Z7984 Long term (current) use of oral hypoglycemic drugs: Secondary | ICD-10-CM | POA: Diagnosis not present

## 2018-10-18 DIAGNOSIS — D473 Essential (hemorrhagic) thrombocythemia: Secondary | ICD-10-CM | POA: Diagnosis not present

## 2018-10-18 DIAGNOSIS — E119 Type 2 diabetes mellitus without complications: Secondary | ICD-10-CM | POA: Diagnosis not present

## 2018-10-18 DIAGNOSIS — R7989 Other specified abnormal findings of blood chemistry: Secondary | ICD-10-CM | POA: Diagnosis not present

## 2018-10-18 DIAGNOSIS — K760 Fatty (change of) liver, not elsewhere classified: Secondary | ICD-10-CM | POA: Insufficient documentation

## 2018-10-18 DIAGNOSIS — Z803 Family history of malignant neoplasm of breast: Secondary | ICD-10-CM | POA: Diagnosis not present

## 2018-10-18 DIAGNOSIS — I10 Essential (primary) hypertension: Secondary | ICD-10-CM | POA: Diagnosis not present

## 2018-10-18 DIAGNOSIS — K219 Gastro-esophageal reflux disease without esophagitis: Secondary | ICD-10-CM | POA: Diagnosis not present

## 2018-10-18 DIAGNOSIS — K449 Diaphragmatic hernia without obstruction or gangrene: Secondary | ICD-10-CM | POA: Insufficient documentation

## 2018-10-18 DIAGNOSIS — Z79899 Other long term (current) drug therapy: Secondary | ICD-10-CM | POA: Diagnosis not present

## 2018-10-18 DIAGNOSIS — E559 Vitamin D deficiency, unspecified: Secondary | ICD-10-CM | POA: Diagnosis not present

## 2018-10-18 DIAGNOSIS — Z791 Long term (current) use of non-steroidal anti-inflammatories (NSAID): Secondary | ICD-10-CM | POA: Diagnosis not present

## 2018-10-18 DIAGNOSIS — D75839 Thrombocytosis, unspecified: Secondary | ICD-10-CM

## 2018-10-18 DIAGNOSIS — D72829 Elevated white blood cell count, unspecified: Secondary | ICD-10-CM | POA: Diagnosis not present

## 2018-10-18 DIAGNOSIS — F418 Other specified anxiety disorders: Secondary | ICD-10-CM | POA: Diagnosis not present

## 2018-10-18 LAB — CBC WITH DIFFERENTIAL/PLATELET
Abs Immature Granulocytes: 0.05 10*3/uL (ref 0.00–0.07)
Basophils Absolute: 0.1 10*3/uL (ref 0.0–0.1)
Basophils Relative: 1 %
Eosinophils Absolute: 0.2 10*3/uL (ref 0.0–0.5)
Eosinophils Relative: 2 %
HCT: 39.2 % (ref 36.0–46.0)
Hemoglobin: 12.8 g/dL (ref 12.0–15.0)
Immature Granulocytes: 0 %
Lymphocytes Relative: 23 %
Lymphs Abs: 2.8 10*3/uL (ref 0.7–4.0)
MCH: 29.3 pg (ref 26.0–34.0)
MCHC: 32.7 g/dL (ref 30.0–36.0)
MCV: 89.7 fL (ref 80.0–100.0)
Monocytes Absolute: 0.7 10*3/uL (ref 0.1–1.0)
Monocytes Relative: 6 %
Neutro Abs: 8.2 10*3/uL — ABNORMAL HIGH (ref 1.7–7.7)
Neutrophils Relative %: 68 %
Platelets: 443 10*3/uL — ABNORMAL HIGH (ref 150–400)
RBC: 4.37 MIL/uL (ref 3.87–5.11)
RDW: 13.7 % (ref 11.5–15.5)
WBC: 12.1 10*3/uL — ABNORMAL HIGH (ref 4.0–10.5)
nRBC: 0 % (ref 0.0–0.2)

## 2018-10-18 LAB — COMPREHENSIVE METABOLIC PANEL
ALT: 13 U/L (ref 0–44)
AST: 16 U/L (ref 15–41)
Albumin: 3.8 g/dL (ref 3.5–5.0)
Alkaline Phosphatase: 93 U/L (ref 38–126)
Anion gap: 10 (ref 5–15)
BUN: 21 mg/dL (ref 8–23)
CO2: 25 mmol/L (ref 22–32)
Calcium: 9.4 mg/dL (ref 8.9–10.3)
Chloride: 103 mmol/L (ref 98–111)
Creatinine, Ser: 1 mg/dL (ref 0.44–1.00)
GFR calc Af Amer: >60 mL/min (ref >60)
GFR calc non Af Amer: >60 mL/min (ref >60)
Glucose, Bld: 159 mg/dL — ABNORMAL HIGH (ref 70–99)
Potassium: 3.7 mmol/L (ref 3.5–5.1)
Sodium: 138 mmol/L (ref 135–145)
Total Bilirubin: 0.5 mg/dL (ref 0.3–1.2)
Total Protein: 7.3 g/dL (ref 6.5–8.1)

## 2018-10-18 LAB — FERRITIN: Ferritin: 22 ng/mL (ref 11–307)

## 2018-10-18 LAB — SEDIMENTATION RATE: Sed Rate: 43 mm/hr — ABNORMAL HIGH (ref 0–30)

## 2018-10-18 NOTE — Progress Notes (Signed)
No new changes noted today 

## 2018-10-18 NOTE — Telephone Encounter (Signed)
-----   Message from Lequita Asal, MD sent at 10/18/2018 12:50 PM EDT ----- Regarding: Please call patient  Ferritin low.  Consider oral iron (ferrous sulfate) 1 tablet a day with OJ or vitamin C.  Recheck CBC and ferritin in 6-8 weeks.  M ----- Message ----- From: Buel Ream, Lab In [place] Sent: 10/18/2018   9:24 AM EDT To: Lequita Asal, MD

## 2018-10-18 NOTE — Telephone Encounter (Signed)
Spoke with the patient to inform her that her ferritin results were 22 today and per Dr Mike Gip request she start oral iron 325 with orange juice or vitamin C. The patient was agreeable to start Vitamin c and Ferrous sulfate 325 and have labs checked in 6 weeks. The patient was schedule for 11/29/2018 at 8:45 AM. The patient was understanding.

## 2018-10-19 ENCOUNTER — Encounter: Payer: Self-pay | Admitting: Hematology and Oncology

## 2018-10-21 ENCOUNTER — Encounter: Payer: Self-pay | Admitting: General Surgery

## 2018-10-21 LAB — COMP PANEL: LEUKEMIA/LYMPHOMA

## 2018-10-28 LAB — HM DIABETES EYE EXAM

## 2018-11-18 ENCOUNTER — Other Ambulatory Visit: Payer: Self-pay | Admitting: Family Medicine

## 2018-11-26 ENCOUNTER — Other Ambulatory Visit: Payer: Self-pay

## 2018-11-29 ENCOUNTER — Other Ambulatory Visit: Payer: Self-pay

## 2018-11-29 ENCOUNTER — Inpatient Hospital Stay: Payer: BLUE CROSS/BLUE SHIELD | Attending: Hematology and Oncology

## 2018-11-29 DIAGNOSIS — D72829 Elevated white blood cell count, unspecified: Secondary | ICD-10-CM | POA: Insufficient documentation

## 2018-11-29 DIAGNOSIS — D473 Essential (hemorrhagic) thrombocythemia: Secondary | ICD-10-CM | POA: Diagnosis not present

## 2018-11-29 LAB — COMPREHENSIVE METABOLIC PANEL
ALT: 12 U/L (ref 0–44)
AST: 10 U/L — ABNORMAL LOW (ref 15–41)
Albumin: 3.9 g/dL (ref 3.5–5.0)
Alkaline Phosphatase: 94 U/L (ref 38–126)
Anion gap: 11 (ref 5–15)
BUN: 22 mg/dL (ref 8–23)
CO2: 26 mmol/L (ref 22–32)
Calcium: 9.4 mg/dL (ref 8.9–10.3)
Chloride: 103 mmol/L (ref 98–111)
Creatinine, Ser: 0.9 mg/dL (ref 0.44–1.00)
GFR calc Af Amer: >60 mL/min (ref >60)
GFR calc non Af Amer: >60 mL/min (ref >60)
Glucose, Bld: 144 mg/dL — ABNORMAL HIGH (ref 70–99)
Potassium: 4.1 mmol/L (ref 3.5–5.1)
Sodium: 140 mmol/L (ref 135–145)
Total Bilirubin: 0.5 mg/dL (ref 0.3–1.2)
Total Protein: 7.2 g/dL (ref 6.5–8.1)

## 2018-11-29 LAB — CBC WITH DIFFERENTIAL/PLATELET
Abs Immature Granulocytes: 0.05 10*3/uL (ref 0.00–0.07)
Basophils Absolute: 0.1 10*3/uL (ref 0.0–0.1)
Basophils Relative: 0 %
Eosinophils Absolute: 0.1 10*3/uL (ref 0.0–0.5)
Eosinophils Relative: 1 %
HCT: 37.7 % (ref 36.0–46.0)
Hemoglobin: 12.2 g/dL (ref 12.0–15.0)
Immature Granulocytes: 0 %
Lymphocytes Relative: 27 %
Lymphs Abs: 3.2 10*3/uL (ref 0.7–4.0)
MCH: 28.9 pg (ref 26.0–34.0)
MCHC: 32.4 g/dL (ref 30.0–36.0)
MCV: 89.3 fL (ref 80.0–100.0)
Monocytes Absolute: 0.7 10*3/uL (ref 0.1–1.0)
Monocytes Relative: 6 %
Neutro Abs: 7.6 10*3/uL (ref 1.7–7.7)
Neutrophils Relative %: 66 %
Platelets: 431 10*3/uL — ABNORMAL HIGH (ref 150–400)
RBC: 4.22 MIL/uL (ref 3.87–5.11)
RDW: 14.2 % (ref 11.5–15.5)
WBC: 11.8 10*3/uL — ABNORMAL HIGH (ref 4.0–10.5)
nRBC: 0 % (ref 0.0–0.2)

## 2018-12-01 ENCOUNTER — Encounter: Payer: Self-pay | Admitting: Family Medicine

## 2018-12-01 ENCOUNTER — Other Ambulatory Visit: Payer: Self-pay

## 2018-12-01 DIAGNOSIS — D72829 Elevated white blood cell count, unspecified: Secondary | ICD-10-CM

## 2018-12-01 MED ORDER — VENLAFAXINE HCL ER 75 MG PO CP24: 75.0000 mg | ORAL_CAPSULE | Freq: Every day | ORAL | 1 refills | Status: DC

## 2018-12-02 ENCOUNTER — Other Ambulatory Visit: Payer: Self-pay

## 2018-12-02 ENCOUNTER — Inpatient Hospital Stay: Payer: BLUE CROSS/BLUE SHIELD

## 2018-12-02 DIAGNOSIS — D72829 Elevated white blood cell count, unspecified: Secondary | ICD-10-CM | POA: Diagnosis not present

## 2018-12-02 LAB — FERRITIN: Ferritin: 26 ng/mL (ref 11–307)

## 2018-12-08 ENCOUNTER — Encounter: Payer: Self-pay | Admitting: Hematology and Oncology

## 2018-12-13 ENCOUNTER — Other Ambulatory Visit: Payer: Self-pay

## 2018-12-13 ENCOUNTER — Telehealth: Payer: Self-pay

## 2018-12-13 DIAGNOSIS — D75839 Thrombocytosis, unspecified: Secondary | ICD-10-CM

## 2018-12-13 DIAGNOSIS — D473 Essential (hemorrhagic) thrombocythemia: Secondary | ICD-10-CM

## 2018-12-13 NOTE — Telephone Encounter (Signed)
-----   Message from Lequita Asal, MD sent at 12/10/2018  1:21 PM EDT ----- Regarding: Please call patient  Patient's hemoglobin and ferritin have decreased slightly (not improved) since oral iron was started on 10/18/2018.  She is not anemic, but iron deficient.  She could try to increase her oral iron or receive IV iron if symptomatic.  M ----- Message ----- From: Vito Berger, [place] Sent: 12/09/2018   2:26 PM EDT To: Lequita Asal, MD  This patient want to know can you review her labs and see if she need IV Iron infusion. Thanks

## 2018-12-13 NOTE — Telephone Encounter (Signed)
Spoke with the patient to inform her that her Hbg and ferritin is slightly low and it didn't improve from 10/18/2018 and she is Iron def. , Per Dr Mike Gip would she like to try increasing her oral Iron or would she like to try IV Iron. The patient states she will increase her oral Iron to BID and get labs rechecked in 1 month. The patient was understanding and agreeable to increase oral Iron to BID with vitamin C or OJ.

## 2018-12-23 ENCOUNTER — Other Ambulatory Visit: Payer: Self-pay | Admitting: Family Medicine

## 2018-12-27 ENCOUNTER — Other Ambulatory Visit: Payer: Self-pay | Admitting: Family Medicine

## 2019-01-12 ENCOUNTER — Inpatient Hospital Stay: Payer: BLUE CROSS/BLUE SHIELD | Attending: Hematology and Oncology

## 2019-01-12 ENCOUNTER — Other Ambulatory Visit: Payer: Self-pay

## 2019-01-12 DIAGNOSIS — D473 Essential (hemorrhagic) thrombocythemia: Secondary | ICD-10-CM | POA: Diagnosis not present

## 2019-01-12 DIAGNOSIS — D72829 Elevated white blood cell count, unspecified: Secondary | ICD-10-CM | POA: Diagnosis not present

## 2019-01-12 DIAGNOSIS — D75839 Thrombocytosis, unspecified: Secondary | ICD-10-CM

## 2019-01-12 LAB — CBC WITH DIFFERENTIAL/PLATELET
Abs Immature Granulocytes: 0.05 10*3/uL (ref 0.00–0.07)
Basophils Absolute: 0.1 10*3/uL (ref 0.0–0.1)
Basophils Relative: 1 %
Eosinophils Absolute: 0.2 10*3/uL (ref 0.0–0.5)
Eosinophils Relative: 1 %
HCT: 40.4 % (ref 36.0–46.0)
Hemoglobin: 12.9 g/dL (ref 12.0–15.0)
Immature Granulocytes: 0 %
Lymphocytes Relative: 20 %
Lymphs Abs: 2.6 10*3/uL (ref 0.7–4.0)
MCH: 28.6 pg (ref 26.0–34.0)
MCHC: 31.9 g/dL (ref 30.0–36.0)
MCV: 89.6 fL (ref 80.0–100.0)
Monocytes Absolute: 0.7 10*3/uL (ref 0.1–1.0)
Monocytes Relative: 5 %
Neutro Abs: 9.7 10*3/uL — ABNORMAL HIGH (ref 1.7–7.7)
Neutrophils Relative %: 73 %
Platelets: 490 10*3/uL — ABNORMAL HIGH (ref 150–400)
RBC: 4.51 MIL/uL (ref 3.87–5.11)
RDW: 15.6 % — ABNORMAL HIGH (ref 11.5–15.5)
WBC: 13.3 10*3/uL — ABNORMAL HIGH (ref 4.0–10.5)
nRBC: 0 % (ref 0.0–0.2)

## 2019-01-12 LAB — FERRITIN: Ferritin: 27 ng/mL (ref 11–307)

## 2019-01-18 ENCOUNTER — Encounter: Payer: Self-pay | Admitting: Hematology and Oncology

## 2019-01-18 ENCOUNTER — Telehealth: Payer: Self-pay

## 2019-01-18 NOTE — Telephone Encounter (Signed)
Spoke with the patient she sent a my chart message which she thinks the Iron pills is causing her weight gain and increase headache. The patient states she can't tolerate this and what do she need to do stop the Iron. I have consulted with NP Lauren and she does think that it is the Iron and see if the patient want to be seen in [facility]. The patient refused to go to [facility] and has agreed to wait on Dr Mike Gip to return to office to review her labs and see if she can get IV Iron. The patient think she might have a problem because of her Insurance. I told we will send a PA over to Ms Laurena Spies ann to see what her insurance would require. The patient was understanding and agreeable to wait.

## 2019-02-26 ENCOUNTER — Other Ambulatory Visit: Payer: Self-pay | Admitting: Family Medicine

## 2019-03-25 ENCOUNTER — Other Ambulatory Visit: Payer: Self-pay | Admitting: Family Medicine

## 2019-03-25 ENCOUNTER — Encounter: Payer: Self-pay | Admitting: Family Medicine

## 2019-03-25 MED ORDER — JARDIANCE 10 MG PO TABS: 10.0000 mg | ORAL_TABLET | Freq: Every day | ORAL | 0 refills | Status: DC

## 2019-03-25 NOTE — Telephone Encounter (Signed)
30-day refill sent in.  Patient needs follow-up scheduled for further refills.  Please call her to get her scheduled.

## 2019-03-29 ENCOUNTER — Telehealth: Payer: Self-pay

## 2019-04-05 NOTE — Telephone Encounter (Signed)
I called and spoke with the patient and she stated she would call her insurance and see if this office will except her insurance, she has [place] if so, she would call back and schedule a f/up with the provider.  Jana Swartzlander,cma

## 2019-04-18 ENCOUNTER — Other Ambulatory Visit: Payer: BLUE CROSS/BLUE SHIELD

## 2019-04-19 ENCOUNTER — Telehealth: Payer: Self-pay

## 2019-04-19 DIAGNOSIS — I10 Essential (primary) hypertension: Secondary | ICD-10-CM

## 2019-04-19 DIAGNOSIS — E785 Hyperlipidemia, unspecified: Secondary | ICD-10-CM

## 2019-04-19 DIAGNOSIS — E669 Obesity, unspecified: Secondary | ICD-10-CM

## 2019-04-19 DIAGNOSIS — E1169 Type 2 diabetes mellitus with other specified complication: Secondary | ICD-10-CM

## 2019-04-19 DIAGNOSIS — E041 Nontoxic single thyroid nodule: Secondary | ICD-10-CM

## 2019-04-19 NOTE — Telephone Encounter (Signed)
Patient called in and stated she has to pay out of pocket for her visits, she just needed labs only and she has an upcoming appt with the provider and wants to discuss lab results then, I ordered the labs from previous and the front staff was calling her to schedule.  Jailon Schaible,cma

## 2019-04-21 ENCOUNTER — Other Ambulatory Visit: Payer: Self-pay | Admitting: Family Medicine

## 2019-04-28 ENCOUNTER — Other Ambulatory Visit: Payer: Self-pay | Admitting: Family Medicine

## 2019-05-06 ENCOUNTER — Inpatient Hospital Stay: Payer: BLUE CROSS/BLUE SHIELD

## 2019-05-14 NOTE — Telephone Encounter (Signed)
Message sent to [facility] and she was scheduled for an appt to see the provider.  Paislei Dorval,cma

## 2019-05-25 ENCOUNTER — Other Ambulatory Visit (INDEPENDENT_AMBULATORY_CARE_PROVIDER_SITE_OTHER): Payer: BLUE CROSS/BLUE SHIELD

## 2019-05-25 ENCOUNTER — Other Ambulatory Visit: Payer: Self-pay

## 2019-05-25 DIAGNOSIS — E669 Obesity, unspecified: Secondary | ICD-10-CM

## 2019-05-25 DIAGNOSIS — E785 Hyperlipidemia, unspecified: Secondary | ICD-10-CM

## 2019-05-25 DIAGNOSIS — E1169 Type 2 diabetes mellitus with other specified complication: Secondary | ICD-10-CM

## 2019-05-25 DIAGNOSIS — I10 Essential (primary) hypertension: Secondary | ICD-10-CM

## 2019-05-25 DIAGNOSIS — E041 Nontoxic single thyroid nodule: Secondary | ICD-10-CM

## 2019-05-25 DIAGNOSIS — D72829 Elevated white blood cell count, unspecified: Secondary | ICD-10-CM

## 2019-05-25 NOTE — Addendum Note (Signed)
Addended by: Leeanne Rio on: 05/25/2019 09:47 AM   Modules accepted: Orders

## 2019-05-26 ENCOUNTER — Other Ambulatory Visit: Payer: Self-pay | Admitting: Family Medicine

## 2019-05-26 LAB — CBC WITH DIFFERENTIAL/PLATELET
Basophils Absolute: 0 10*3/uL (ref 0.0–0.2)
Basos: 0 %
EOS (ABSOLUTE): 0.2 10*3/uL (ref 0.0–0.4)
Eos: 1 %
Hematocrit: 41.4 % (ref 34.0–46.6)
Hemoglobin: 13.3 g/dL (ref 11.1–15.9)
Immature Grans (Abs): 0 10*3/uL (ref 0.0–0.1)
Immature Granulocytes: 0 %
Lymphocytes Absolute: 2.4 10*3/uL (ref 0.7–3.1)
Lymphs: 21 %
MCH: 28.8 pg (ref 26.6–33.0)
MCHC: 32.1 g/dL (ref 31.5–35.7)
MCV: 90 fL (ref 79–97)
Monocytes Absolute: 0.5 10*3/uL (ref 0.1–0.9)
Monocytes: 5 %
Neutrophils Absolute: 8.2 10*3/uL — ABNORMAL HIGH (ref 1.4–7.0)
Neutrophils: 73 %
Platelets: 540 10*3/uL — ABNORMAL HIGH (ref 150–450)
RBC: 4.62 x10E6/uL (ref 3.77–5.28)
RDW: 14.7 % (ref 11.7–15.4)
WBC: 11.4 10*3/uL — ABNORMAL HIGH (ref 3.4–10.8)

## 2019-05-26 LAB — COMPREHENSIVE METABOLIC PANEL
ALT: 12 IU/L (ref 0–32)
AST: 11 IU/L (ref 0–40)
Albumin/Globulin Ratio: 1.6 (ref 1.2–2.2)
Albumin: 4.2 g/dL (ref 3.8–4.8)
Alkaline Phosphatase: 117 IU/L (ref 39–117)
BUN/Creatinine Ratio: 19 (ref 12–28)
BUN: 18 mg/dL (ref 8–27)
Bilirubin Total: 0.4 mg/dL (ref 0.0–1.2)
CO2: 22 mmol/L (ref 20–29)
Calcium: 9.8 mg/dL (ref 8.7–10.3)
Chloride: 102 mmol/L (ref 96–106)
Creatinine, Ser: 0.97 mg/dL (ref 0.57–1.00)
GFR calc Af Amer: 72 mL/min/{1.73_m2} (ref >59)
GFR calc non Af Amer: 62 mL/min/{1.73_m2} (ref >59)
Globulin, Total: 2.6 g/dL (ref 1.5–4.5)
Glucose: 138 mg/dL — ABNORMAL HIGH (ref 65–99)
Potassium: 4.5 mmol/L (ref 3.5–5.2)
Sodium: 141 mmol/L (ref 134–144)
Total Protein: 6.8 g/dL (ref 6.0–8.5)

## 2019-05-26 LAB — LIPID PANEL
Chol/HDL Ratio: 2.8 ratio (ref 0.0–4.4)
Cholesterol, Total: 128 mg/dL (ref 100–199)
HDL: 45 mg/dL (ref >39)
LDL Chol Calc (NIH): 52 mg/dL (ref 0–99)
Triglycerides: 188 mg/dL — ABNORMAL HIGH (ref 0–149)
VLDL Cholesterol Cal: 31 mg/dL (ref 5–40)

## 2019-05-26 LAB — HEMOGLOBIN A1C
Est. average glucose Bld gHb Est-mCnc: 157 mg/dL
Hgb A1c MFr Bld: 7.1 % — ABNORMAL HIGH (ref 4.8–5.6)

## 2019-05-26 LAB — TSH: TSH: 1.01 u[IU]/mL (ref 0.450–4.500)

## 2019-05-30 ENCOUNTER — Ambulatory Visit (INDEPENDENT_AMBULATORY_CARE_PROVIDER_SITE_OTHER): Payer: BLUE CROSS/BLUE SHIELD | Admitting: Family Medicine

## 2019-05-30 ENCOUNTER — Other Ambulatory Visit: Payer: Self-pay

## 2019-05-30 DIAGNOSIS — F32A Depression, unspecified: Secondary | ICD-10-CM

## 2019-05-30 DIAGNOSIS — R61 Generalized hyperhidrosis: Secondary | ICD-10-CM | POA: Diagnosis not present

## 2019-05-30 DIAGNOSIS — E669 Obesity, unspecified: Secondary | ICD-10-CM

## 2019-05-30 DIAGNOSIS — F419 Anxiety disorder, unspecified: Secondary | ICD-10-CM

## 2019-05-30 DIAGNOSIS — I10 Essential (primary) hypertension: Secondary | ICD-10-CM | POA: Diagnosis not present

## 2019-05-30 DIAGNOSIS — E1169 Type 2 diabetes mellitus with other specified complication: Secondary | ICD-10-CM | POA: Diagnosis not present

## 2019-05-30 DIAGNOSIS — F329 Major depressive disorder, single episode, unspecified: Secondary | ICD-10-CM

## 2019-05-30 MED ORDER — VENLAFAXINE HCL ER 37.5 MG PO CP24: 37.5000 mg | ORAL_CAPSULE | Freq: Every day | ORAL | 0 refills | Status: DC

## 2019-05-30 MED ORDER — METFORMIN HCL 1000 MG PO TABS: ORAL_TABLET | ORAL | 1 refills | Status: DC

## 2019-05-30 MED ORDER — JARDIANCE 25 MG PO TABS: 25.0000 mg | ORAL_TABLET | Freq: Every day | ORAL | 1 refills | Status: DC

## 2019-05-30 NOTE — Assessment & Plan Note (Signed)
Increase Jardiance to 25 mg once daily.  She will work on adding in exercise as well.  Continue Metformin.

## 2019-05-30 NOTE — Assessment & Plan Note (Signed)
This continues to be an issue.  We will see how she does on a decreased dose of Effexor and she will let us know how she is doing in 2 weeks.

## 2019-05-30 NOTE — Progress Notes (Signed)
Virtual Visit via telephone Note  This visit type was conducted due to national recommendations for restrictions regarding the COVID-19 pandemic (e.g. social distancing).  This format is felt to be most appropriate for this patient at this time.  All issues noted in this document were discussed and addressed.  No physical exam was performed (except for noted visual exam findings with Video Visits).   I connected with Shelby Estes today at  2:15 PM EDT by telephone and verified that I am speaking with the correct person using two identifiers. Location patient: home Location provider: home office Persons participating in the virtual visit: patient, provider  I discussed the limitations, risks, security and privacy concerns of performing an evaluation and management service by telephone and the availability of in person appointments. I also discussed with the patient that there may be a patient responsible charge related to this service. The patient expressed understanding and agreed to proceed.  Interactive audio and video telecommunications were attempted between this provider and patient, however failed, due to patient having technical difficulties OR patient did not have access to video capability.  We continued and completed visit with audio only.   Reason for visit: f/u.  HPI: DIABETES Disease Monitoring: Blood Sugar ranges-120s-130s though increases to the 160s with pain in her knees. Polyuria/phagia/dipsia-no.      Optho-up-to-date. Medications: Compliance-taking Jardiance and Metformin. Hypoglycemic symptoms-no.  HYPERTENSION  Disease Monitoring  Home BP Monitoring not checking. Chest pain-no.    Dyspnea-no. Medications  Compliance-taking Lasix, HCTZ, losartan, and metoprolol.Hart Carwin.  Anxiety/depression: Notes this is relatively well controlled.  She has been on Effexor with good benefit though she is having hot flashes and sweats during the day.  No night sweats.  Occurs  several times a day.  No SI.  Obesity: Continues to have trouble losing weight.  She stopped drinking sodas last year and did lose weight with that and was able to keep it off though notes she gained some weight when going on iron supplements through hematology.  She stopped the iron supplement though has not been able to lose weight.  She is not able to exercise because she has bad knees that prevents her from doing any weightbearing exercises.  She notes she eats fairly healthfully and does not crave food.   ROS: See pertinent positives and negatives per HPI.  Past Medical History:  Diagnosis Date  . Anxiety   . Depression   . Diabetes mellitus 2010  . Elevated BP   . GERD (gastroesophageal reflux disease)   . Hypertension   . Kidney stone    Dr. Cyndie Mull, April 2014  . Renal cyst    Dr. Cyndie Mull  . SVT (supraventricular tachycardia) [facility])    s/p cardioversion  . Vitamin D deficiency     Past Surgical History:  Procedure Laterality Date  . ABDOMINAL HYSTERECTOMY  09/27/13   Dr. Veryl Speak  . Arm Surgery     due to fracture left elbow  . CHOLECYSTECTOMY  5/07   Dr Bary Castilla  . COLONOSCOPY  2007   Dr Bary Castilla  . COLONOSCOPY WITH PROPOFOL N/A 06/20/2015   Procedure: COLONOSCOPY WITH PROPOFOL;  Surgeon: [name], MD;  Location: [facility] ENDOSCOPY;  Service: Endoscopy;  Laterality: N/A;  . TUBAL LIGATION  02-21-01    Family History  Problem Relation Age of Onset  . COPD Father   . Stroke Father   . Diabetes Mother        borderline  . Hypertension  Mother   . Thyroid disease Mother   . Breast cancer Maternal Aunt   . Seizures Maternal Grandmother   . Breast cancer Other   . Cancer Other        breast  . Colon cancer Sister        65  . Colon cancer Cousin        72's    SOCIAL HX: Non-smoker   Current Outpatient Medications:  .  ADULT ASPIRIN REGIMEN 81 MG EC tablet, , Disp: , Rfl:  .  Cholecalciferol (VITAMIN D) 2000 UNITS CAPS, Take 1  capsule by mouth daily., Disp: , Rfl:  .  empagliflozin (JARDIANCE) 25 MG TABS tablet, Take 25 mg by mouth daily before breakfast., Disp: 90 tablet, Rfl: 1 .  furosemide (LASIX) 20 MG tablet, , Disp: , Rfl:  .  hydrochlorothiazide (HYDRODIURIL) 12.5 MG tablet, hydrochlorothiazide 12.5 mg tablet, Disp: , Rfl:  .  losartan (COZAAR) 100 MG tablet, losartan 100 mg tablet, Disp: , Rfl:  .  meloxicam (MOBIC) 15 MG tablet, Take 15 mg by mouth daily. , Disp: , Rfl:  .  metFORMIN (GLUCOPHAGE) 1000 MG tablet, TAKE ONE TABLET TWICE DAILY WITH A MEAL, Disp: 180 tablet, Rfl: 1 .  metoprolol tartrate (LOPRESSOR) 25 MG tablet, Take 1 tablet (25 mg total) by mouth 2 (two) times daily., Disp: 60 tablet, Rfl: 6 .  pantoprazole (PROTONIX) 40 MG tablet, Take 1 tablet (40 mg total) by mouth daily., Disp: 30 tablet, Rfl: 3 .  rosuvastatin (CRESTOR) 10 MG tablet, TAKE ONE TABLET EVERY DAY, Disp: 30 tablet, Rfl: 0 .  venlafaxine XR (EFFEXOR-XR) 37.5 MG 24 hr capsule, Take 1 capsule (37.5 mg total) by mouth daily with breakfast., Disp: 30 capsule, Rfl: 0  EXAM:  VITALS per patient if applicable:  GENERAL: alert, oriented, appears well and in no acute distress  HEENT: atraumatic, conjunttiva clear, no obvious abnormalities on inspection of external nose and ears  NECK: normal movements of the head and neck  LUNGS: on inspection no signs of respiratory distress, breathing rate appears normal, no obvious gross SOB, gasping or wheezing  CV: no obvious cyanosis  MS: moves all visible extremities without noticeable abnormality  PSYCH/NEURO: pleasant and cooperative, no obvious depression or anxiety, speech and thought processing grossly intact  ASSESSMENT AND PLAN:  Discussed the following assessment and plan:  Hypertension She will check her BP and let us know what is running.  She will continue her current regimen.  Diabetes mellitus type 2 in obese Increase Jardiance to 25 mg once daily.  She will work on  adding in exercise as well.  Continue Metformin.  Anxiety and depression Stable on current dose of Effexor though she is having possible side effects with the Effexor.  She will taper down to 37.5 mg once daily of Effexor.  If she stops having the sweating and hot flash issues and her symptoms of anxiety and depression remain adequately controlled she can stay on that dose otherwise we will switch her to something different.  She will call us in 2 weeks to let us know how she is doing with the decreased dose.  Excessive sweating This continues to be an issue.  We will see how she does on a decreased dose of Effexor and she will let us know how she is doing in 2 weeks.  Obesity, Class III, BMI 40-49.9 (morbid obesity) Encouraged adding in low impact exercise.  We can see how she is doing in about 4  months.   No orders of the defined types were placed in this encounter.   Meds ordered this encounter  Medications  . empagliflozin (JARDIANCE) 25 MG TABS tablet    Sig: Take 25 mg by mouth daily before breakfast.    Dispense:  90 tablet    Refill:  1  . metFORMIN (GLUCOPHAGE) 1000 MG tablet    Sig: TAKE ONE TABLET TWICE DAILY WITH A MEAL    Dispense:  180 tablet    Refill:  1  . venlafaxine XR (EFFEXOR-XR) 37.5 MG 24 hr capsule    Sig: Take 1 capsule (37.5 mg total) by mouth daily with breakfast.    Dispense:  30 capsule    Refill:  0    FOR FUTURE REFILLS     I discussed the assessment and treatment plan with the patient. The patient was provided an opportunity to ask questions and all were answered. The patient agreed with the plan and demonstrated an understanding of the instructions.   The patient was advised to call back or seek an in-person evaluation if the symptoms worsen or if the condition fails to improve as anticipated.  I provided 30 minutes of non-face-to-face time during this encounter.   Tommi Rumps, MD

## 2019-05-30 NOTE — Assessment & Plan Note (Signed)
She will check her BP and let us know what is running.  She will continue her current regimen.

## 2019-05-30 NOTE — Assessment & Plan Note (Signed)
>>  ASSESSMENT AND PLAN FOR ANXIETY AND DEPRESSION WRITTEN ON 05/30/2019  3:16 PM BY SONNENBERG, ERIC G, MD  Stable on current dose of Effexor  though she is having possible side effects with the Effexor .  She will taper down to 37.5 mg once daily of Effexor .  If she stops having the sweating and hot flash issues and her symptoms of anxiety and depression remain adequately controlled she can stay on that dose otherwise we will switch her to something different.  She will call us  in 2 weeks to let us  know how she is doing with the decreased dose.

## 2019-05-30 NOTE — Assessment & Plan Note (Signed)
Encouraged adding in low impact exercise.  We can see how she is doing in about 4 months.

## 2019-05-30 NOTE — Assessment & Plan Note (Signed)
Stable on current dose of Effexor though she is having possible side effects with the Effexor.  She will taper down to 37.5 mg once daily of Effexor.  If she stops having the sweating and hot flash issues and her symptoms of anxiety and depression remain adequately controlled she can stay on that dose otherwise we will switch her to something different.  She will call us in 2 weeks to let us know how she is doing with the decreased dose.

## 2019-06-20 ENCOUNTER — Encounter: Payer: Self-pay | Admitting: Family Medicine

## 2019-06-22 ENCOUNTER — Encounter: Payer: Self-pay | Admitting: Family Medicine

## 2019-06-23 ENCOUNTER — Encounter: Payer: Self-pay | Admitting: Family Medicine

## 2019-06-23 NOTE — Telephone Encounter (Signed)
Pt called wanting to talk to Gae Bon about the Cymbalta

## 2019-06-24 MED ORDER — DULOXETINE HCL 30 MG PO CPEP: ORAL_CAPSULE | ORAL | 1 refills | Status: DC

## 2019-06-24 NOTE — Telephone Encounter (Signed)
Pt is following up on the below message. She would like this called in today if possible.

## 2019-09-19 ENCOUNTER — Other Ambulatory Visit: Payer: Self-pay | Admitting: Family Medicine

## 2019-09-30 ENCOUNTER — Ambulatory Visit (INDEPENDENT_AMBULATORY_CARE_PROVIDER_SITE_OTHER): Payer: BLUE CROSS/BLUE SHIELD | Admitting: Family Medicine

## 2019-09-30 ENCOUNTER — Other Ambulatory Visit: Payer: Self-pay

## 2019-09-30 ENCOUNTER — Encounter: Payer: Self-pay | Admitting: Family Medicine

## 2019-09-30 VITALS — BP 110/80 | HR 75 | Temp 97.8°F | Ht 64.0 in | Wt 268.6 lb

## 2019-09-30 DIAGNOSIS — E669 Obesity, unspecified: Secondary | ICD-10-CM

## 2019-09-30 DIAGNOSIS — M899 Disorder of bone, unspecified: Secondary | ICD-10-CM | POA: Insufficient documentation

## 2019-09-30 DIAGNOSIS — F329 Major depressive disorder, single episode, unspecified: Secondary | ICD-10-CM

## 2019-09-30 DIAGNOSIS — R61 Generalized hyperhidrosis: Secondary | ICD-10-CM

## 2019-09-30 DIAGNOSIS — F32A Depression, unspecified: Secondary | ICD-10-CM

## 2019-09-30 DIAGNOSIS — E1169 Type 2 diabetes mellitus with other specified complication: Secondary | ICD-10-CM

## 2019-09-30 DIAGNOSIS — J309 Allergic rhinitis, unspecified: Secondary | ICD-10-CM

## 2019-09-30 DIAGNOSIS — I10 Essential (primary) hypertension: Secondary | ICD-10-CM | POA: Diagnosis not present

## 2019-09-30 DIAGNOSIS — R0989 Other specified symptoms and signs involving the circulatory and respiratory systems: Secondary | ICD-10-CM

## 2019-09-30 DIAGNOSIS — F419 Anxiety disorder, unspecified: Secondary | ICD-10-CM

## 2019-09-30 MED ORDER — FLUTICASONE PROPIONATE 50 MCG/ACT NA SUSP: 2.0000 | Freq: Every day | NASAL | 6 refills | Status: DC

## 2019-09-30 NOTE — Assessment & Plan Note (Signed)
>>  ASSESSMENT AND PLAN FOR ANXIETY AND DEPRESSION WRITTEN ON 09/30/2019 10:53 AM BY SONNENBERG, ERIC G, MD  Stable.  Continue Cymbalta .

## 2019-09-30 NOTE — Progress Notes (Signed)
Tommi Rumps, MD Phone: [phone]  ARRYN Estes is a 64 y.o. female who presents today for f/u.  HYPERTENSION  Disease Monitoring  Home BP Monitoring similar to today Chest pain- no    Dyspnea- no Medications  Compliance-  Taking losartan and HCTZ.   Edema- no  DIABETES Disease Monitoring: Blood Sugar ranges-110 lat afternoon, 148 fasting Polyuria/phagia/dipsia- no      Optho- UTD Medications: Compliance- taking jardiance, metformin Hypoglycemic symptoms- no  Anxiety/depression: Patient has no symptoms currently.  She is doing okay on the Cymbalta.  Her sweating has improved to only occurring 1-2 times a day as opposed to once every hour.  Only occurs in her head and neck.  The sweating is likely related to Effexor.  Headache/forehead lesion: Patient notes intermittent issues with headaches through the years.  Typically related to sinus issues.  She has chronic postnasal drip.  No vision changes, numbness, or weakness.  She notes a knot on her right forehead that is been present for at least a year.  Occasionally gets tender.     Social History   Tobacco Use  Smoking Status Never Smoker  Smokeless Tobacco Never Used     ROS see history of present illness  Objective  Physical Exam Vitals:   09/30/19 1024  BP: 110/80  Pulse: 75  Temp: 97.8 F (36.6 C)    BP Readings from Last 3 Encounters:  09/30/19 110/80  10/18/18 121/79  04/19/18 114/73   Wt Readings from Last 3 Encounters:  09/30/19 268 lb 9.6 oz (121.8 kg)  10/18/18 261 lb 0.4 oz (118.4 kg)  04/19/18 254 lb 13.6 oz (115.6 kg)    Physical Exam Constitutional:      General: She is not in acute distress.    Appearance: She is not diaphoretic.  HENT:     Head:   Cardiovascular:     Rate and Rhythm: Normal rate and regular rhythm.     Heart sounds: Normal heart sounds.  Pulmonary:     Effort: Pulmonary effort is normal.     Breath sounds: Normal breath sounds.  Musculoskeletal:     Right  lower leg: No edema.     Left lower leg: No edema.  Skin:    General: Skin is warm and dry.  Neurological:     Mental Status: She is alert.      Assessment/Plan: Please see individual problem list.  Hypertension Well-controlled.  Continue current regimen.  Diabetes mellitus type 2 in obese Check A1c.  Continue current regimen. ABI ordered.   Skull lesion Imaging ordered to evaluate for cause of this lesion  Excessive sweating Improved significantly.  Likely related to Effexor.  She will monitor on Cymbalta and see if it improves further.  Anxiety and depression Stable.  Continue Cymbalta.  Allergic rhinitis Suspect this is contributing to her headaches.  We will trial Flonase and see if that is beneficial.  Orders Placed This Encounter  Procedures  . CT Head Wo Contrast    Standing Status:   Future    Standing Expiration Date:   09/29/2020    Scheduling Instructions:     Needs to be scheduled at [facility] ([facility]    Order Specific Question:   ** REASON FOR EXAM (FREE TEXT)    Answer:   patient with intermittent headaches, has a pea sized bony lesion in her left forehead that is mildly tender, has been present for at least a year    Order Specific Question:   Preferred  imaging location?    Answer:   External    Order Specific Question:   Radiology Contrast Protocol - do NOT remove file path    Answer:   [email or web address]  . US ARTERIAL ABI (SCREENING LOWER EXTREMITY)    Standing Status:   Future    Standing Expiration Date:   09/29/2020    Scheduling Instructions:     Needs to be done at [facility]    Order Specific Question:   Reason for Exam (SYMPTOM  OR DIAGNOSIS REQUIRED)    Answer:   difficult to palpate right DP pulse    Order Specific Question:   Preferred imaging location?    Answer:   External  . HgB A1c    Meds ordered this encounter  Medications  . fluticasone (FLONASE) 50 MCG/ACT nasal spray    Sig: Place 2 sprays into both nostrils daily.     Dispense:  16 g    Refill:  6    This visit occurred during the SARS-CoV-2 public health emergency.  Safety protocols were in place, including screening questions prior to the visit, additional usage of staff PPE, and extensive cleaning of exam room while observing appropriate contact time as indicated for disinfecting solutions.    Tommi Rumps, MD [place]

## 2019-09-30 NOTE — Assessment & Plan Note (Signed)
Imaging ordered to evaluate for cause of this lesion

## 2019-09-30 NOTE — Patient Instructions (Signed)
Nice to see you. We will get a scan of your head. Please go to [facility] to have labs completed. Please try the Flonase. Please monitor the sweating.

## 2019-09-30 NOTE — Assessment & Plan Note (Signed)
Well-controlled.  Continue current regimen. 

## 2019-09-30 NOTE — Assessment & Plan Note (Addendum)
Check A1c.  Continue current regimen. ABI ordered.

## 2019-09-30 NOTE — Assessment & Plan Note (Signed)
Stable.  Continue Cymbalta. 

## 2019-09-30 NOTE — Assessment & Plan Note (Signed)
Improved significantly.  Likely related to Effexor.  She will monitor on Cymbalta and see if it improves further.

## 2019-09-30 NOTE — Assessment & Plan Note (Signed)
Suspect this is contributing to her headaches.  We will trial Flonase and see if that is beneficial.

## 2019-10-01 LAB — HEMOGLOBIN A1C
Est. average glucose Bld gHb Est-mCnc: 160 mg/dL
Hgb A1c MFr Bld: 7.2 % — ABNORMAL HIGH (ref 4.8–5.6)

## 2019-10-04 ENCOUNTER — Encounter: Payer: Self-pay | Admitting: Family Medicine

## 2019-10-11 ENCOUNTER — Other Ambulatory Visit: Payer: Self-pay | Admitting: Family Medicine

## 2019-10-11 MED ORDER — OZEMPIC (0.25 OR 0.5 MG/DOSE) 2 MG/1.5ML ~~LOC~~ SOPN: PEN_INJECTOR | SUBCUTANEOUS | 0 refills | Status: DC

## 2019-10-14 ENCOUNTER — Encounter: Payer: Self-pay | Admitting: Family Medicine

## 2019-10-16 ENCOUNTER — Encounter: Payer: Self-pay | Admitting: Family Medicine

## 2019-10-17 ENCOUNTER — Ambulatory Visit: Payer: BLUE CROSS/BLUE SHIELD | Admitting: Hematology and Oncology

## 2019-10-17 ENCOUNTER — Other Ambulatory Visit: Payer: BLUE CROSS/BLUE SHIELD

## 2019-10-17 ENCOUNTER — Ambulatory Visit: Payer: Self-pay | Admitting: Hematology and Oncology

## 2019-10-24 ENCOUNTER — Encounter: Payer: Self-pay | Admitting: Family Medicine

## 2019-10-24 ENCOUNTER — Other Ambulatory Visit: Payer: Self-pay | Admitting: Family Medicine

## 2019-10-26 ENCOUNTER — Telehealth: Payer: Self-pay | Admitting: Family Medicine

## 2019-10-26 NOTE — Telephone Encounter (Signed)
Per Dr Caryl Bis wanted me to reach out to [facility] regarding [name] pt was called today 08/11 to get scheduled pt told [facility] scheduler that she wants to talk with her cardiologist before scheduling. I advised Dr Caryl Bis what pt stated.

## 2019-10-26 NOTE — Telephone Encounter (Signed)
Pt would like a call back about CT results ASAP. Please advise

## 2019-10-27 NOTE — Telephone Encounter (Signed)
Pt called stated that she got the mychart results from the CT scan but she don't understand it could someone please call her and explain it to her

## 2019-10-28 NOTE — Telephone Encounter (Signed)
Pt called in wanting some help with the result from CT scan on head need a call back.

## 2019-10-29 NOTE — Telephone Encounter (Signed)
Dr. Caryl Bis,   I  called the patient to address her question about the impression statement on her head CT:  She wants to know if she has acute intracranial abnormalities and what is the cause of the bump on her right forehead?   CT FINDINGS:  There is no midline shift. No mass lesion. There is no evidence of acute infarct. No acute intracranial hemorrhage. No fractures are evident. The sinuses are pneumatized. 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   IMPRESSION:  Acute intracranial abnormalities.   PLAN:  1. I told her that I do not see any acute changes mentioned in the body of the report. The impression statement may need to be amended by the [facility] radiologist . Dr. Caryl Bis will handle communicating with [facility]  about this. The patient was satisfied with this.   2. She would like you to make further recommendations about the forehead bump concern. I told her that I will relay this information.   Thanks,   Dawson Bills, NP

## 2019-11-01 NOTE — Telephone Encounter (Signed)
I suspect there was a typo or dictation error in the impression as the body of the findings does not indicate any acute changes. I will have someone call to see about having this addended. The bony lesion was not seen on the CT scan though could be an osteoma which is a benign bone lesion. We could refer to plastic surgery to get their input on whether or not this needs to be removed.   Gae Bon, can you call the radiology department at [facility] and see if they could have the radiologist look at the impression on her CT scan to see if it needed to be addended to say "no acute intracranial abnormalities". You do not necessarily need to speak with the radiologist as the staff will likely be able to relay this to the radiologist.

## 2019-11-03 NOTE — Telephone Encounter (Signed)
Noted. Can you call the patient and relay that? Also please relay the rest of my message below and see what the patient would like to do. Thanks.

## 2019-11-04 ENCOUNTER — Telehealth: Payer: Self-pay

## 2019-11-06 MED ORDER — RYBELSUS 7 MG PO TABS: 7.0000 mg | ORAL_TABLET | Freq: Every day | ORAL | 3 refills | Status: DC

## 2019-11-06 MED ORDER — RYBELSUS 3 MG PO TABS: 3.0000 mg | ORAL_TABLET | Freq: Every day | ORAL | 0 refills | Status: DC

## 2019-11-06 NOTE — Telephone Encounter (Signed)
Sent to pharmacy 

## 2019-11-24 NOTE — Telephone Encounter (Signed)
Called and LVM for the patien to call back for a lab appointment to check urine if symptoms still persists.  Seirra Kos,cma

## 2019-11-29 NOTE — Telephone Encounter (Signed)
Noted. ABIs read as normal by performing physician.

## 2019-11-30 LAB — HM MAMMOGRAPHY

## 2019-12-02 ENCOUNTER — Other Ambulatory Visit: Payer: Self-pay | Admitting: Family Medicine

## 2019-12-09 ENCOUNTER — Other Ambulatory Visit: Payer: Self-pay | Admitting: Family Medicine

## 2020-01-02 ENCOUNTER — Other Ambulatory Visit: Payer: Self-pay | Admitting: Family Medicine

## 2020-01-16 ENCOUNTER — Other Ambulatory Visit: Payer: Self-pay | Admitting: Family Medicine

## 2020-01-17 ENCOUNTER — Telehealth: Payer: Self-pay | Admitting: Family Medicine

## 2020-01-17 NOTE — Telephone Encounter (Signed)
Mychart sent to inform patient/Ask questions.

## 2020-01-17 NOTE — Telephone Encounter (Signed)
This was first filled on September.  Has she been taking this for 30 days?  When did she start on it?  She also needs follow-up scheduled.  Thanks.

## 2020-01-17 NOTE — Telephone Encounter (Signed)
Please call [facility] about patient's RYBELSUS 3 MG TABS. Patient states she should be taking 7mg 

## 2020-01-17 NOTE — Telephone Encounter (Signed)
Ok to switch medication to 7mg ?

## 2020-01-18 MED ORDER — RYBELSUS 7 MG PO TABS: 7.0000 mg | ORAL_TABLET | Freq: Every day | ORAL | 3 refills | Status: DC

## 2020-01-30 ENCOUNTER — Other Ambulatory Visit: Payer: Self-pay

## 2020-01-31 ENCOUNTER — Ambulatory Visit (INDEPENDENT_AMBULATORY_CARE_PROVIDER_SITE_OTHER): Payer: BLUE CROSS/BLUE SHIELD | Admitting: Family Medicine

## 2020-01-31 ENCOUNTER — Encounter: Payer: Self-pay | Admitting: Family Medicine

## 2020-01-31 DIAGNOSIS — I10 Essential (primary) hypertension: Secondary | ICD-10-CM | POA: Diagnosis not present

## 2020-01-31 DIAGNOSIS — D72829 Elevated white blood cell count, unspecified: Secondary | ICD-10-CM

## 2020-01-31 DIAGNOSIS — Z800 Family history of malignant neoplasm of digestive organs: Secondary | ICD-10-CM

## 2020-01-31 DIAGNOSIS — E669 Obesity, unspecified: Secondary | ICD-10-CM | POA: Diagnosis not present

## 2020-01-31 DIAGNOSIS — E1169 Type 2 diabetes mellitus with other specified complication: Secondary | ICD-10-CM | POA: Diagnosis not present

## 2020-01-31 DIAGNOSIS — K219 Gastro-esophageal reflux disease without esophagitis: Secondary | ICD-10-CM | POA: Diagnosis not present

## 2020-01-31 MED ORDER — PANTOPRAZOLE SODIUM 40 MG PO TBEC: 40.0000 mg | DELAYED_RELEASE_TABLET | Freq: Two times a day (BID) | ORAL | 3 refills | Status: DC

## 2020-01-31 NOTE — Assessment & Plan Note (Signed)
Check A1c.  Continue Rybelsus 7 mg once daily, Metformin 1000 mg twice daily, and Jardiance 25 mg daily.

## 2020-01-31 NOTE — Assessment & Plan Note (Signed)
Reflux is uncontrolled.  Discussed taking her Protonix 30 minutes for breakfast and 30 minutes before dinner.  Will refer to GI as she does need an EGD based on her symptoms.

## 2020-01-31 NOTE — Patient Instructions (Signed)
Nice to see you. We will refer you to GI. Please take your Protonix 30 minutes before breakfast and 30 minutes before dinner. We will contact you with your lab results.

## 2020-01-31 NOTE — Assessment & Plan Note (Signed)
Well-controlled.  Continue HCTZ 12.5 mg daily, losartan 100 mg daily, and metoprolol 25 mg twice daily.  She does have follow-up with cardiology later this week.  She will keep that appointment.

## 2020-01-31 NOTE — Assessment & Plan Note (Signed)
Refer to GI for colonoscopy given family history.

## 2020-01-31 NOTE — Progress Notes (Signed)
Tommi Rumps, MD Phone: [phone]  Shelby Estes is a 64 y.o. female who presents today for f/u.  HYPERTENSION  Disease Monitoring  Home BP Monitoring 113/75 Chest pain- no    Dyspnea- no Medications  Compliance-  Taking HCTZ, losartan, metoprolol.    DIABETES Disease Monitoring: Blood Sugar ranges-not checking consistently, it was 166 this am Polyuria/phagia/dipsia- no      Medications: Compliance- taking rybelsus, metformin, jardiance Hypoglycemic symptoms- no  GERD/epigastric pain: Patient notes constant reflux.  She feels as though food stops in her epigastric region when she swallows.  She is able to regurgitated at times.  She takes Protonix oftentimes takes it 30 minutes after she eats breakfast.  She reports no family history of gastric or esophageal cancer though does have a family history of colon cancer in her sister and a cousin.  No blood in her stool.  Leukocytosis: Patient is due for follow-up.  She has had work-up through oncology for this previously.     Social History   Tobacco Use  Smoking Status Never Smoker  Smokeless Tobacco Never Used     ROS see history of present illness  Objective  Physical Exam Vitals:   01/31/20 0813  BP: 120/78  Pulse: (!) 105  Temp: 98.2 F (36.8 C)  SpO2: 99%    BP Readings from Last 3 Encounters:  01/31/20 120/78  09/30/19 110/80  10/18/18 121/79   Wt Readings from Last 3 Encounters:  01/31/20 253 lb 12.8 oz (115.1 kg)  09/30/19 268 lb 9.6 oz (121.8 kg)  10/18/18 261 lb 0.4 oz (118.4 kg)    Physical Exam Constitutional:      General: She is not in acute distress.    Appearance: She is not diaphoretic.  Cardiovascular:     Rate and Rhythm: Normal rate and regular rhythm.     Heart sounds: Normal heart sounds.  Pulmonary:     Effort: Pulmonary effort is normal.     Breath sounds: Normal breath sounds.  Abdominal:     General: Bowel sounds are normal. There is no distension.     Palpations:  Abdomen is soft.     Tenderness: There is no abdominal tenderness. There is no guarding or rebound.  Musculoskeletal:     Right lower leg: No edema.     Left lower leg: No edema.  Skin:    General: Skin is warm and dry.  Neurological:     Mental Status: She is alert.      Assessment/Plan: Please see individual problem list.  Problem List Items Addressed This Visit    Diabetes mellitus type 2 in obese (HCC)    Check A1c.  Continue Rybelsus 7 mg once daily, Metformin 1000 mg twice daily, and Jardiance 25 mg daily.      Relevant Orders   HgB A1c   Family history of colon cancer    Refer to GI for colonoscopy given family history.      Relevant Orders   Ambulatory referral to Gastroenterology   GERD (gastroesophageal reflux disease)    Reflux is uncontrolled.  Discussed taking her Protonix 30 minutes for breakfast and 30 minutes before dinner.  Will refer to GI as she does need an EGD based on her symptoms.      Relevant Medications   pantoprazole (PROTONIX) 40 MG tablet   Other Relevant Orders   Ambulatory referral to Gastroenterology   Hypertension    Well-controlled.  Continue HCTZ 12.5 mg daily, losartan 100 mg daily,  and metoprolol 25 mg twice daily.  She does have follow-up with cardiology later this week.  She will keep that appointment.      Relevant Orders   Basic Metabolic Panel (BMET)   Leukocytosis    Due for follow-up labs.  CBC with differential ordered.      Relevant Orders   CBC w/Diff       Health Maintenance: Declines COVID-19 vaccine.  I advised her to see her ophthalmologist for her yearly eye exam.   This visit occurred during the SARS-CoV-2 public health emergency.  Safety protocols were in place, including screening questions prior to the visit, additional usage of staff PPE, and extensive cleaning of exam room while observing appropriate contact time as indicated for disinfecting solutions.    Tommi Rumps, MD [place]

## 2020-01-31 NOTE — Assessment & Plan Note (Signed)
Due for follow-up labs.  CBC with differential ordered.

## 2020-02-01 ENCOUNTER — Telehealth: Payer: Self-pay | Admitting: Family Medicine

## 2020-02-01 LAB — BASIC METABOLIC PANEL
BUN/Creatinine Ratio: 11 — ABNORMAL LOW (ref 12–28)
BUN: 11 mg/dL (ref 8–27)
CO2: 24 mmol/L (ref 20–29)
Calcium: 10 mg/dL (ref 8.7–10.3)
Chloride: 100 mmol/L (ref 96–106)
Creatinine, Ser: 1.04 mg/dL — ABNORMAL HIGH (ref 0.57–1.00)
GFR calc Af Amer: 66 mL/min/{1.73_m2} (ref >59)
GFR calc non Af Amer: 57 mL/min/{1.73_m2} — ABNORMAL LOW (ref >59)
Glucose: 141 mg/dL — ABNORMAL HIGH (ref 65–99)
Potassium: 4.1 mmol/L (ref 3.5–5.2)
Sodium: 144 mmol/L (ref 134–144)

## 2020-02-01 LAB — CBC WITH DIFFERENTIAL/PLATELET
Basophils Absolute: 0.1 10*3/uL (ref 0.0–0.2)
Basos: 0 %
EOS (ABSOLUTE): 0.1 10*3/uL (ref 0.0–0.4)
Eos: 1 %
Hematocrit: 38.9 % (ref 34.0–46.6)
Hemoglobin: 13.6 g/dL (ref 11.1–15.9)
Immature Grans (Abs): 0 10*3/uL (ref 0.0–0.1)
Immature Granulocytes: 0 %
Lymphocytes Absolute: 2.1 10*3/uL (ref 0.7–3.1)
Lymphs: 16 %
MCH: 35.6 pg — ABNORMAL HIGH (ref 26.6–33.0)
MCHC: 35 g/dL (ref 31.5–35.7)
MCV: 102 fL — ABNORMAL HIGH (ref 79–97)
Monocytes Absolute: 0.7 10*3/uL (ref 0.1–0.9)
Monocytes: 5 %
Neutrophils Absolute: 10.4 10*3/uL — ABNORMAL HIGH (ref 1.4–7.0)
Neutrophils: 78 %
Platelets: 455 10*3/uL — ABNORMAL HIGH (ref 150–450)
RBC: 3.82 x10E6/uL (ref 3.77–5.28)
RDW: 15.5 % — ABNORMAL HIGH (ref 11.7–15.4)
WBC: 13.4 10*3/uL — ABNORMAL HIGH (ref 3.4–10.8)

## 2020-02-01 LAB — HEMOGLOBIN A1C
Est. average glucose Bld gHb Est-mCnc: 169 mg/dL
Hgb A1c MFr Bld: 7.5 % — ABNORMAL HIGH (ref 4.8–5.6)

## 2020-02-01 NOTE — Telephone Encounter (Signed)
Patient called in stated that she need to talk with Dr.Sonnenberg about the problem she talked with him before she has new information about it .

## 2020-02-01 NOTE — Telephone Encounter (Signed)
Patient called back stated that she was checking on her referral for colonoscopy

## 2020-02-06 ENCOUNTER — Other Ambulatory Visit: Payer: Self-pay | Admitting: Family Medicine

## 2020-02-23 ENCOUNTER — Other Ambulatory Visit: Payer: Self-pay | Admitting: Family Medicine

## 2020-02-23 DIAGNOSIS — D7589 Other specified diseases of blood and blood-forming organs: Secondary | ICD-10-CM

## 2020-03-12 ENCOUNTER — Other Ambulatory Visit: Payer: Self-pay | Admitting: Family Medicine

## 2020-03-23 DIAGNOSIS — R1310 Dysphagia, unspecified: Secondary | ICD-10-CM | POA: Insufficient documentation

## 2020-04-04 ENCOUNTER — Other Ambulatory Visit: Payer: Self-pay | Admitting: Family Medicine

## 2020-04-10 ENCOUNTER — Other Ambulatory Visit: Payer: Self-pay | Admitting: Family Medicine

## 2020-04-30 ENCOUNTER — Other Ambulatory Visit: Payer: Self-pay

## 2020-05-02 ENCOUNTER — Ambulatory Visit: Payer: BLUE CROSS/BLUE SHIELD | Admitting: Family Medicine

## 2020-05-02 ENCOUNTER — Ambulatory Visit (INDEPENDENT_AMBULATORY_CARE_PROVIDER_SITE_OTHER): Payer: BLUE CROSS/BLUE SHIELD

## 2020-05-02 ENCOUNTER — Encounter: Payer: Self-pay | Admitting: Family Medicine

## 2020-05-02 ENCOUNTER — Other Ambulatory Visit: Payer: Self-pay

## 2020-05-02 VITALS — BP 125/80 | HR 76 | Temp 98.3°F | Ht 64.0 in | Wt 237.0 lb

## 2020-05-02 DIAGNOSIS — I10 Essential (primary) hypertension: Secondary | ICD-10-CM

## 2020-05-02 DIAGNOSIS — E785 Hyperlipidemia, unspecified: Secondary | ICD-10-CM

## 2020-05-02 DIAGNOSIS — E1169 Type 2 diabetes mellitus with other specified complication: Secondary | ICD-10-CM | POA: Diagnosis not present

## 2020-05-02 DIAGNOSIS — Z8701 Personal history of pneumonia (recurrent): Secondary | ICD-10-CM | POA: Diagnosis not present

## 2020-05-02 DIAGNOSIS — E669 Obesity, unspecified: Secondary | ICD-10-CM

## 2020-05-02 DIAGNOSIS — D7589 Other specified diseases of blood and blood-forming organs: Secondary | ICD-10-CM

## 2020-05-02 NOTE — Patient Instructions (Addendum)
Nice to see you. We will get labs today and an x-ray. Please make sure you see an eye doctor yearly given your history of diabetes.

## 2020-05-02 NOTE — Progress Notes (Signed)
Tommi Rumps, MD Phone: [phone]  Shelby Estes is a 65 y.o. female who presents today for f/u.  HYPERTENSION Disease Monitoring: Blood pressure range-110-115/65-80 Chest pain- no      Dyspnea- no Medications: Compliance- taking HCTZ, losartan, metoprolol   Edema- no  DIABETES Disease Monitoring: Blood Sugar ranges-fasting 175, evening 120-135 Polyuria/phagia/dipsia- no      Medications: Compliance- taking jardiance, metformin Hypoglycemic symptoms- no  HYPERLIPIDEMIA Disease Monitoring: See symptoms for Hypertension Medications: Compliance- taking crestor Right upper quadrant pain- no  Muscle aches- no  Pneumonia: Patient had pneumonia back in November 2021.  She was sick for a week and had negative Covid testing.  She eventually went to the emergency department was diagnosed with right upper lung pneumonia.  She was treated with cefdinir and doxycycline and recovered well.  She has not had any recurrent symptoms.     Social History   Tobacco Use  Smoking Status Never Smoker  Smokeless Tobacco Never Used    Current Outpatient Medications on File Prior to Visit  Medication Sig Dispense Refill  . ADULT ASPIRIN REGIMEN 81 MG EC tablet     . Cholecalciferol (VITAMIN D) 2000 UNITS CAPS Take 1 capsule by mouth daily.    . DULoxetine (CYMBALTA) 30 MG capsule TAKE 1 CAPSULE EVERY DAY FOR 28 DAYS THEN TAKE 2 CAPSULES EVERY DAY AS DIRECTED 180 capsule 1  . fluticasone (FLONASE) 50 MCG/ACT nasal spray Place 2 sprays into both nostrils daily. 16 g 6  . furosemide (LASIX) 20 MG tablet     . hydrochlorothiazide (HYDRODIURIL) 12.5 MG tablet hydrochlorothiazide 12.5 mg tablet    . JARDIANCE 25 MG TABS tablet TAKE ONE TABLET BY MOUTH EVERY DAY BEFORE BREAKFAST 90 tablet 1  . losartan (COZAAR) 100 MG tablet losartan 100 mg tablet    . meloxicam (MOBIC) 15 MG tablet Take 15 mg by mouth daily.     . metFORMIN (GLUCOPHAGE) 1000 MG tablet TAKE ONE TABLET BY MOUTH TWICE DAILY WITH  A MEAL 180 tablet 1  . metoprolol tartrate (LOPRESSOR) 25 MG tablet Take 1 tablet (25 mg total) by mouth 2 (two) times daily. 60 tablet 6  . pantoprazole (PROTONIX) 40 MG tablet Take 1 tablet (40 mg total) by mouth 2 (two) times daily before a meal. 60 tablet 3  . rosuvastatin (CRESTOR) 10 MG tablet TAKE 1 TABLET BY MOUTH DAILY 30 tablet 0   No current facility-administered medications on file prior to visit.     ROS see history of present illness  Objective  Physical Exam Vitals:   05/02/20 0935  BP: 125/80  Pulse: 76  Temp: 98.3 F (36.8 C)  SpO2: 99%    BP Readings from Last 3 Encounters:  05/02/20 125/80  01/31/20 120/78  09/30/19 110/80   Wt Readings from Last 3 Encounters:  05/02/20 237 lb (107.5 kg)  01/31/20 253 lb 12.8 oz (115.1 kg)  09/30/19 268 lb 9.6 oz (121.8 kg)    Physical Exam Constitutional:      General: She is not in acute distress.    Appearance: She is not diaphoretic.  Cardiovascular:     Rate and Rhythm: Normal rate and regular rhythm.     Heart sounds: Normal heart sounds.  Pulmonary:     Effort: Pulmonary effort is normal.     Breath sounds: Normal breath sounds.  Musculoskeletal:        General: No edema.     Right lower leg: No edema.     Left  lower leg: No edema.  Skin:    General: Skin is warm and dry.  Neurological:     Mental Status: She is alert.      Assessment/Plan: Please see individual problem list.  Problem List Items Addressed This Visit    Diabetes mellitus type 2 in obese ([place])    Uncontrolled based on home glucoses.  Check A1c.  She will continue Jardiance 25 mg once daily and Metformin 1000 mg twice daily.  Unable to tolerate GLP-1's.      Relevant Orders   HgB A1c   History of pneumonia    Patient has recovered well and is now asymptomatic.  Benign lung exam.  Repeat chest x-ray to confirm resolution.      Relevant Orders   DG Chest 2 View   Hyperlipidemia    Continue Crestor 10 mg once daily.  Check  lipid panel.      Relevant Orders   Lipid panel   Hypertension - Primary    Adequately controlled.  She will continue hydrochlorothiazide 12.5 mg once daily, losartan 100 mg once daily, and metoprolol 25 mg twice daily.      Macrocytosis    Check labs as outlined.      Relevant Orders   CBC w/Diff   B12   Folate      This visit occurred during the SARS-CoV-2 public health emergency.  Safety protocols were in place, including screening questions prior to the visit, additional usage of staff PPE, and extensive cleaning of exam room while observing appropriate contact time as indicated for disinfecting solutions.    Tommi Rumps, MD [place]

## 2020-05-02 NOTE — Assessment & Plan Note (Signed)
Patient has recovered well and is now asymptomatic.  Benign lung exam.  Repeat chest x-ray to confirm resolution.

## 2020-05-02 NOTE — Assessment & Plan Note (Signed)
Uncontrolled based on home glucoses.  Check A1c.  She will continue Jardiance 25 mg once daily and Metformin 1000 mg twice daily.  Unable to tolerate GLP-1's.

## 2020-05-02 NOTE — Assessment & Plan Note (Signed)
Adequately controlled.  She will continue hydrochlorothiazide 12.5 mg once daily, losartan 100 mg once daily, and metoprolol 25 mg twice daily.

## 2020-05-02 NOTE — Assessment & Plan Note (Signed)
Continue Crestor 10 mg once daily.  Check lipid panel.

## 2020-05-02 NOTE — Assessment & Plan Note (Signed)
Check labs as outlined.   °

## 2020-05-03 ENCOUNTER — Encounter: Payer: Self-pay | Admitting: Family Medicine

## 2020-05-03 LAB — LIPID PANEL
Chol/HDL Ratio: 3.2 ratio (ref 0.0–4.4)
Cholesterol, Total: 115 mg/dL (ref 100–199)
HDL: 36 mg/dL — ABNORMAL LOW (ref >39)
LDL Chol Calc (NIH): 40 mg/dL (ref 0–99)
Triglycerides: 251 mg/dL — ABNORMAL HIGH (ref 0–149)
VLDL Cholesterol Cal: 39 mg/dL (ref 5–40)

## 2020-05-03 LAB — CBC WITH DIFFERENTIAL/PLATELET
Basophils Absolute: 0 10*3/uL (ref 0.0–0.2)
Basos: 0 %
EOS (ABSOLUTE): 0.2 10*3/uL (ref 0.0–0.4)
Eos: 2 %
Hematocrit: 33.1 % — ABNORMAL LOW (ref 34.0–46.6)
Hemoglobin: 11.7 g/dL (ref 11.1–15.9)
Immature Grans (Abs): 0 10*3/uL (ref 0.0–0.1)
Immature Granulocytes: 0 %
Lymphocytes Absolute: 2.1 10*3/uL (ref 0.7–3.1)
Lymphs: 17 %
MCH: 38.2 pg — ABNORMAL HIGH (ref 26.6–33.0)
MCHC: 35.3 g/dL (ref 31.5–35.7)
MCV: 108 fL — ABNORMAL HIGH (ref 79–97)
Monocytes Absolute: 0.6 10*3/uL (ref 0.1–0.9)
Monocytes: 5 %
Neutrophils Absolute: 9.1 10*3/uL — ABNORMAL HIGH (ref 1.4–7.0)
Neutrophils: 76 %
Platelets: 400 10*3/uL (ref 150–450)
RBC: 3.06 x10E6/uL — ABNORMAL LOW (ref 3.77–5.28)
RDW: 16.8 % — ABNORMAL HIGH (ref 11.7–15.4)
WBC: 11.9 10*3/uL — ABNORMAL HIGH (ref 3.4–10.8)

## 2020-05-03 LAB — FOLATE: Folate: 8 ng/mL (ref >3.0)

## 2020-05-03 LAB — VITAMIN B12: Vitamin B-12: 121 pg/mL — ABNORMAL LOW (ref 232–1245)

## 2020-05-03 LAB — HEMOGLOBIN A1C
Est. average glucose Bld gHb Est-mCnc: 148 mg/dL
Hgb A1c MFr Bld: 6.8 % — ABNORMAL HIGH (ref 4.8–5.6)

## 2020-05-04 ENCOUNTER — Encounter: Payer: Self-pay | Admitting: Family Medicine

## 2020-05-04 DIAGNOSIS — R918 Other nonspecific abnormal finding of lung field: Secondary | ICD-10-CM

## 2020-05-05 ENCOUNTER — Encounter: Payer: Self-pay | Admitting: Family Medicine

## 2020-05-07 ENCOUNTER — Encounter: Payer: Self-pay | Admitting: Family Medicine

## 2020-05-07 ENCOUNTER — Other Ambulatory Visit: Payer: Self-pay | Admitting: Family Medicine

## 2020-05-08 ENCOUNTER — Telehealth: Payer: Self-pay | Admitting: Family Medicine

## 2020-05-08 NOTE — Telephone Encounter (Signed)
lft pt vm to inquire if pt wants CT done at [place]? Thanks

## 2020-05-10 ENCOUNTER — Telehealth: Payer: Self-pay

## 2020-05-10 ENCOUNTER — Other Ambulatory Visit: Payer: Self-pay | Admitting: Family Medicine

## 2020-05-10 DIAGNOSIS — E538 Deficiency of other specified B group vitamins: Secondary | ICD-10-CM

## 2020-05-15 ENCOUNTER — Other Ambulatory Visit: Payer: Self-pay | Admitting: Family Medicine

## 2020-05-18 NOTE — Telephone Encounter (Signed)
Error.  Rozlyn Yerby,cma  

## 2020-05-21 ENCOUNTER — Encounter: Payer: Self-pay | Admitting: Family Medicine

## 2020-05-22 NOTE — Telephone Encounter (Signed)
Please call the patient and get more details on her symptoms.  When did her symptoms start?  What symptoms does she currently have?  Is she having any shortness of breath, chest pain, fevers, or other symptoms?  What medications that she tried?  Would she be willing to do a virtual visit with someone to discuss potential treatment options?

## 2020-05-23 NOTE — Telephone Encounter (Signed)
Noted.  She needs to remain quarantined for at least 10 days from symptom onset and has to have at least 24 hours of improved symptoms without fever prior to coming off of quarantine.

## 2020-05-25 NOTE — Telephone Encounter (Signed)
I called the patient and inforemd her about the quarantine guidelines and she understood.  Kinan Safley,cma

## 2020-05-28 ENCOUNTER — Telehealth: Payer: Self-pay | Admitting: Family Medicine

## 2020-05-28 NOTE — Telephone Encounter (Signed)
[facility] Diagnostic scheduling dept. [place], [place]. She was calling and wanted to know if patient still needed to have the  CT scan of the chest without contrast, dated 3/7. Please call her.

## 2020-05-29 NOTE — Telephone Encounter (Signed)
I placed this order previously. I will forward to Rasheedah to have her send the order to the patient's requested location.

## 2020-05-29 NOTE — Telephone Encounter (Signed)
Refax order to [facility].

## 2020-05-29 NOTE — Telephone Encounter (Signed)
I received a call fro [facility] and they needed an order faxed to [phone] so the patient can have the CT scan of her chest.  She staetd she has an approval but no order.  Nina,cma

## 2020-06-08 ENCOUNTER — Telehealth: Payer: Self-pay | Admitting: Family Medicine

## 2020-06-08 DIAGNOSIS — R911 Solitary pulmonary nodule: Secondary | ICD-10-CM | POA: Insufficient documentation

## 2020-06-08 NOTE — Telephone Encounter (Signed)
Please let the patient know that her CT imaging revealed findings that may be related to an inflammatory process or an atypical infection with scattered groundglass nodules and interstitial prominence.  They make reference to her having a history of COVID-19 infection and that these findings could be related to that.  Please see if she had COVID-19 recently.  Please see if she has any respiratory symptoms.  There was also a solid nodule in the right upper lobe which needs a follow-up scan in 6 months.

## 2020-06-08 NOTE — Telephone Encounter (Signed)
Noted.  I suspect the inflammatory CT findings are likely related to her recent COVID-19 infection.  At this point I would suggest that we see what her follow-up imaging in 6 months reveals though could consider further evaluation or testing if her cough from Covid does not improve over the next several weeks.  She should contact us if she is not improving.

## 2020-06-08 NOTE — Telephone Encounter (Signed)
Report given to provider for review.  Nina,cma

## 2020-06-08 NOTE — Telephone Encounter (Signed)
Patient called in about her result from CT scan

## 2020-06-08 NOTE — Telephone Encounter (Signed)
I called the patient and informed her of her Ct findings.  Patient stated she had Pneumonia the week of christmas and she tested positive for Covid-19 2 weeks ago.  She is having the cough from covid still but no respiratory problems.

## 2020-06-11 ENCOUNTER — Other Ambulatory Visit: Payer: Self-pay | Admitting: Family Medicine

## 2020-06-11 ENCOUNTER — Encounter: Payer: Self-pay | Admitting: Family Medicine

## 2020-06-14 NOTE — Telephone Encounter (Signed)
I called and spoke with the patient and I informed her that the provider wanted to wait until her next imaging in 6 mos and see what it reveals and if her cough would improve on its own in a few weeks, but I informed her to call us back if she does not improve and she understood.  Alixander Rallis,cma

## 2020-07-09 ENCOUNTER — Other Ambulatory Visit: Payer: Self-pay | Admitting: Family Medicine

## 2020-07-26 ENCOUNTER — Other Ambulatory Visit: Payer: Self-pay

## 2020-08-01 ENCOUNTER — Ambulatory Visit: Payer: BLUE CROSS/BLUE SHIELD | Admitting: Family Medicine

## 2020-08-09 ENCOUNTER — Other Ambulatory Visit: Payer: Self-pay | Admitting: Family Medicine

## 2020-09-12 ENCOUNTER — Other Ambulatory Visit: Payer: Self-pay | Admitting: Family Medicine

## 2020-10-02 ENCOUNTER — Other Ambulatory Visit: Payer: Self-pay | Admitting: Family Medicine

## 2020-10-11 ENCOUNTER — Other Ambulatory Visit: Payer: Self-pay | Admitting: Family Medicine

## 2020-10-17 ENCOUNTER — Other Ambulatory Visit: Payer: Self-pay | Admitting: Family Medicine

## 2020-10-17 DIAGNOSIS — K219 Gastro-esophageal reflux disease without esophagitis: Secondary | ICD-10-CM

## 2020-11-12 ENCOUNTER — Other Ambulatory Visit: Payer: Self-pay | Admitting: Family Medicine

## 2020-12-07 ENCOUNTER — Other Ambulatory Visit: Payer: Self-pay | Admitting: Family Medicine

## 2020-12-10 NOTE — Telephone Encounter (Signed)
She is overdue for follow-up. Please get her scheduled then I will consider refilling.

## 2020-12-10 NOTE — Telephone Encounter (Signed)
Pt last seen on 05/02/20 Ok to increase to 90 day supply?

## 2020-12-11 NOTE — Telephone Encounter (Signed)
Called and spoke with Manuela Schwartz. Scheduled her for 02/01/21 at 4pm. Pt states that she is currently out of medication and asks for a refill to last her until that date.

## 2020-12-21 ENCOUNTER — Other Ambulatory Visit: Payer: Self-pay | Admitting: Family Medicine

## 2020-12-21 DIAGNOSIS — K219 Gastro-esophageal reflux disease without esophagitis: Secondary | ICD-10-CM

## 2020-12-22 ENCOUNTER — Other Ambulatory Visit: Payer: Self-pay | Admitting: Family Medicine

## 2021-01-24 ENCOUNTER — Telehealth: Payer: Self-pay | Admitting: Family Medicine

## 2021-01-24 DIAGNOSIS — K219 Gastro-esophageal reflux disease without esophagitis: Secondary | ICD-10-CM

## 2021-01-24 NOTE — Telephone Encounter (Signed)
Pt called in to reschedule her appt do to insurance issue. Pt reschedule appt is on 05/03/2021. Pt requested for an appt in Dec, No available appt in Dec with Dr. Caryl Bis. Pt is requesting for medications (DULoxetine (CYMBALTA) 30 MG capsule), (JARDIANCE 25 MG TABS tablet), (metFORMIN (GLUCOPHAGE) 1000 MG tablet), (pantoprazole (PROTONIX) 40 MG tablet), and (rosuvastatin (CRESTOR) 10 MG tablet) to be refilled until her future appt with Dr. Caryl Bis. Pt would like callback.

## 2021-01-24 NOTE — Telephone Encounter (Signed)
Pt called in to reschedule her appt do to insurance issue. Pt reschedule appt is on 05/03/2021. Pt requested for an appt in Dec, No available appt in Dec with Dr. Caryl Bis. Pt is requesting for medications (DULoxetine (CYMBALTA) 30 MG capsule), (JARDIANCE 25 MG TABS tablet), (metFORMIN (GLUCOPHAGE) 1000 MG tablet), (pantoprazole (PROTONIX) 40 MG tablet), and (rosuvastatin (CRESTOR) 10 MG tablet) to be refilled until her future appt with Dr. Caryl Bis.  Khaliel Morey,cma

## 2021-01-25 MED ORDER — METFORMIN HCL 1000 MG PO TABS: 1000.0000 mg | ORAL_TABLET | Freq: Two times a day (BID) | ORAL | 1 refills | Status: DC

## 2021-01-25 MED ORDER — PANTOPRAZOLE SODIUM 40 MG PO TBEC: DELAYED_RELEASE_TABLET | ORAL | 3 refills | Status: DC

## 2021-01-25 MED ORDER — ROSUVASTATIN CALCIUM 10 MG PO TABS: 10.0000 mg | ORAL_TABLET | Freq: Every day | ORAL | 1 refills | Status: DC

## 2021-01-25 MED ORDER — EMPAGLIFLOZIN 25 MG PO TABS: 25.0000 mg | ORAL_TABLET | Freq: Every day | ORAL | 1 refills | Status: DC

## 2021-01-25 MED ORDER — DULOXETINE HCL 30 MG PO CPEP: ORAL_CAPSULE | ORAL | 1 refills | Status: DC

## 2021-01-25 NOTE — Telephone Encounter (Signed)
Refill sent to pharmacy.   

## 2021-02-01 ENCOUNTER — Ambulatory Visit: Payer: BLUE CROSS/BLUE SHIELD | Admitting: Family Medicine

## 2021-03-19 ENCOUNTER — Encounter: Payer: Self-pay | Admitting: Family Medicine

## 2021-03-20 NOTE — Telephone Encounter (Signed)
Scheduled Patient with NP for possible yeast and advised to call insurance concerning B 12 injections.

## 2021-03-22 ENCOUNTER — Encounter: Payer: Self-pay | Admitting: Family Medicine

## 2021-03-25 ENCOUNTER — Other Ambulatory Visit: Payer: Self-pay | Admitting: Family

## 2021-03-25 DIAGNOSIS — E538 Deficiency of other specified B group vitamins: Secondary | ICD-10-CM

## 2021-03-25 NOTE — Progress Notes (Signed)
Acute Office Visit  Subjective:    Patient ID: Shelby Estes, female    DOB: December 25, 1955, 66 y.o.   MRN: [id number]  Chief Complaint  Patient presents with   Vaginitis    HPI Patient is in today for vaginal itching and irritation . Denies any bleeding.  Is on Jardiance.  Denies any urinary symptoms other than odor in urine. Denies any abdominal pain.   Patient  denies any fever, body aches,chills, rash, chest pain, shortness of breath, nausea, vomiting, or diarrhea.    Past Medical History:  Diagnosis Date   Anxiety    Depression    Diabetes mellitus 2010   Elevated BP    GERD (gastroesophageal reflux disease)    Hypertension    Kidney stone    Dr. Cyndie Mull, April 2014   Renal cyst    Dr. Daniels-urologist   SVT (supraventricular tachycardia) [facility])    s/p cardioversion   Vitamin D deficiency     Past Surgical History:  Procedure Laterality Date   ABDOMINAL HYSTERECTOMY  09/27/13   Dr. Veryl Speak   Arm Surgery     due to fracture left elbow   CHOLECYSTECTOMY  5/07   Dr Bary Castilla   COLONOSCOPY  2007   Dr Bary Castilla   COLONOSCOPY WITH PROPOFOL N/A 06/20/2015   Procedure: COLONOSCOPY WITH PROPOFOL;  Surgeon: [name], MD;  Location: [facility] ENDOSCOPY;  Service: Endoscopy;  Laterality: N/A;   TUBAL LIGATION  02-21-01    Family History  Problem Relation Age of Onset   COPD Father    Stroke Father    Diabetes Mother        borderline   Hypertension Mother    Thyroid disease Mother    Breast cancer Maternal Aunt    Seizures Maternal Grandmother    Breast cancer Other    Cancer Other        breast   Colon cancer Sister        48   Colon cancer Cousin        40's    Social History   Socioeconomic History   Marital status: Married    Spouse name: Not on file   Number of children: 0   Years of education: Not on file   Highest education level: Not on file  Occupational History   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 Hobbies - Quilting, gardening      Regular Exercise -  NO   Daily Caffeine Use:  1-2 cups coffee, 1-2 soda/tea               Social Determinants of Health   Financial Resource Strain: Not on file  Food Insecurity: Not on file  Transportation Needs: Not on file  Physical Activity: Not on file  Stress: Not on file  Social Connections: Not on file  Intimate Partner Violence: Not on file    Outpatient Medications Prior to Visit  Medication Sig Dispense Refill   ADULT ASPIRIN REGIMEN  81 MG EC tablet      Cholecalciferol (VITAMIN D) 2000 UNITS CAPS Take 1 capsule by mouth daily.     DULoxetine (CYMBALTA) 30 MG capsule TAKE 2 CAPSULES EVERY DAY AS DIRECTED 180 capsule 1   empagliflozin (JARDIANCE) 25 MG TABS tablet Take 1 tablet (25 mg total) by mouth daily before breakfast. 90 tablet 1   fluticasone (FLONASE) 50 MCG/ACT nasal spray Place 2 sprays into both nostrils daily. 16 g 6   furosemide (LASIX) 20 MG tablet      hydrochlorothiazide (HYDRODIURIL) 12.5 MG tablet hydrochlorothiazide 12.5 mg tablet     losartan (COZAAR) 100 MG tablet losartan 100 mg tablet     meloxicam (MOBIC) 15 MG tablet Take 15 mg by mouth daily.      metFORMIN (GLUCOPHAGE) 1000 MG tablet Take 1 tablet (1,000 mg total) by mouth 2 (two) times daily with a meal. 180 tablet 1   metoprolol tartrate (LOPRESSOR) 25 MG tablet Take 1 tablet (25 mg total) by mouth 2 (two) times daily. 60 tablet 6   pantoprazole (PROTONIX) 40 MG tablet TAKE ONE TABLET BY MOUTH TWICE DAILY BEFORE MEALS 60 tablet 3    rosuvastatin (CRESTOR) 10 MG tablet Take 1 tablet (10 mg total) by mouth daily. 90 tablet 1   No facility-administered medications prior to visit.    Allergies  Allergen Reactions   Bupropion     Muscle aches    Clarithromycin    Enalapril     edema   Phentermine     SVT   Ceclor [Cefaclor] Swelling and Rash   Penicillins Swelling and Rash   Sulfa Antibiotics Swelling and Rash    Review of Systems  Constitutional: Negative.   HENT: Negative.    Respiratory: Negative.    Cardiovascular: Negative.   Gastrointestinal: Negative.   Genitourinary:  Positive for dysuria. Negative for difficulty urinating.       Vaginal itching   Musculoskeletal: Negative.   Psychiatric/Behavioral: Negative.        Objective:    Physical Exam Exam conducted with a chaperone present.  Constitutional:      Appearance: Normal appearance.  Cardiovascular:     Rate and Rhythm: Normal rate and regular rhythm.     Pulses: Normal pulses.     Heart sounds: Normal heart sounds.  Genitourinary:    Tanner stage (genital): 5.     Labia:        Right: No rash or tenderness.        Left: No rash or tenderness.      Vagina: Vaginal discharge (white discharge) present.  Neurological:     Mental Status: She is alert.  Psychiatric:        Mood and Affect: Mood normal.        Behavior: Behavior normal.        Thought Content: Thought content normal.        Judgment: Judgment normal.     General: Appearance:    Well developed, well nourished female in no acute distress  Eyes:    PERRL, conjunctiva/corneas clear, EOM's intact       Lungs:     Clear to auscultation bilaterally, respirations unlabored  Heart:    Normal heart rate. Normal rhythm. No murmurs, rubs, or gallops.    MS:   All extremities are intact.    Neurologic:   Awake, alert, oriented x 3. No apparent focal neurological           defect.  BP 126/88    Pulse 84    Ht 5' 4.02" (1.626 m)    Wt 240 lb 12.8 oz (109.2 kg)    SpO2 97%     BMI 41.31 kg/m  Wt Readings from Last 3 Encounters:  03/26/21 240 lb 12.8 oz (109.2 kg)  05/02/20 237 lb (107.5 kg)  01/31/20 253 lb 12.8 oz (115.1 kg)    Health Maintenance Due  Topic Date Due   COVID-19 Vaccine (1) Never done   HIV Screening  Never done   Zoster Vaccines- Shingrix (1 of 2) Never done   Pneumonia Vaccine 6+ Years old (2 - PCV) 12/15/2018   OPHTHALMOLOGY EXAM  10/28/2019   COLONOSCOPY (Pts 45-40yrs Insurance coverage will need to be confirmed)  06/19/2020   TETANUS/TDAP  07/18/2020   FOOT EXAM  09/29/2020   INFLUENZA VACCINE  10/15/2020   HEMOGLOBIN A1C  10/30/2020   DEXA SCAN  Never done    There are no preventive care reminders to display for this patient.   Lab Results  Component Value Date   TSH 1.010 05/25/2019   Lab Results  Component Value Date   WBC 9.7 03/26/2021   HGB 12.1 03/26/2021   HCT 36.8 03/26/2021   MCV 96.2 03/26/2021   PLT 432.0 (H) 03/26/2021   Lab Results  Component Value Date   NA 139 03/26/2021   K 3.9 03/26/2021   CO2 28 03/26/2021   GLUCOSE 121 (H) 03/26/2021   BUN 21 03/26/2021   CREATININE 1.00 03/26/2021   BILITOT 0.5 03/26/2021   ALKPHOS 89 03/26/2021   AST 15 03/26/2021   ALT 13 03/26/2021   PROT 6.8 03/26/2021   ALBUMIN 4.4 03/26/2021   CALCIUM 9.8 03/26/2021   ANIONGAP 11 11/29/2018   GFR 59.31 (L) 03/26/2021   Lab Results  Component Value Date   CHOL 115 05/02/2020   Lab Results  Component Value Date   HDL 36 (L) 05/02/2020   Lab Results  Component Value Date   LDLCALC 40 05/02/2020   Lab Results  Component Value Date   TRIG 251 (H) 05/02/2020   Lab Results  Component Value Date   CHOLHDL 3.2 05/02/2020   Lab Results  Component Value Date   HGBA1C 6.8 (H) 05/02/2020       Assessment & Plan:   Problem List Items Addressed This Visit   None Visit Diagnoses     Bad odor of urine    -  Primary   Relevant Orders   Comprehensive metabolic panel (Completed)   Cervicovaginal  ancillary only( [place])   Vaginal itching       Relevant Orders   Urine Microscopic Only (Completed)   Urine Culture (Completed)   B12 deficiency          She declined yeast medication feels monistat is helping.  Return if symptoms worsen or fail to improve, for at any time for any worsening symptoms, Go to Emergency room/ urgent care if worse.  Red Flags discussed. The patient was given clear instructions to go to ER or return to medical center if any red flags develop, symptoms do not improve, worsen or new problems develop. They verbalized understanding.   No orders of the defined types were placed in this encounter.    Marcille Buffy, FNP

## 2021-03-25 NOTE — Telephone Encounter (Signed)
Patient  had low B 12 2/22 and never came in for injections due to insurance would not pay, patient requesting iB 12 injection now okay to give.

## 2021-03-26 ENCOUNTER — Ambulatory Visit: Payer: BLUE CROSS/BLUE SHIELD | Admitting: Adult Health

## 2021-03-26 ENCOUNTER — Other Ambulatory Visit (HOSPITAL_COMMUNITY)
Admission: RE | Admit: 2021-03-26 | Discharge: 2021-03-26 | Disposition: A | Discharge status: Home / Self Care | Payer: Medicare Other | Source: Ambulatory Visit | Attending: Adult Health | Admitting: Adult Health

## 2021-03-26 ENCOUNTER — Encounter: Payer: Self-pay | Admitting: Adult Health

## 2021-03-26 ENCOUNTER — Ambulatory Visit (INDEPENDENT_AMBULATORY_CARE_PROVIDER_SITE_OTHER): Payer: Medicare Other | Admitting: Adult Health

## 2021-03-26 ENCOUNTER — Other Ambulatory Visit: Payer: Self-pay

## 2021-03-26 VITALS — BP 126/88 | HR 84 | Ht 64.02 in | Wt 240.8 lb

## 2021-03-26 DIAGNOSIS — N898 Other specified noninflammatory disorders of vagina: Secondary | ICD-10-CM

## 2021-03-26 DIAGNOSIS — E538 Deficiency of other specified B group vitamins: Secondary | ICD-10-CM

## 2021-03-26 DIAGNOSIS — R8290 Unspecified abnormal findings in urine: Secondary | ICD-10-CM | POA: Insufficient documentation

## 2021-03-26 NOTE — Patient Instructions (Signed)

## 2021-03-27 ENCOUNTER — Encounter: Payer: Self-pay | Admitting: Adult Health

## 2021-03-27 LAB — COMPREHENSIVE METABOLIC PANEL
ALT: 13 U/L (ref 0–35)
AST: 15 U/L (ref 0–37)
Albumin: 4.4 g/dL (ref 3.5–5.2)
Alkaline Phosphatase: 89 U/L (ref 39–117)
BUN: 21 mg/dL (ref 6–23)
CO2: 28 mEq/L (ref 19–32)
Calcium: 9.8 mg/dL (ref 8.4–10.5)
Chloride: 101 mEq/L (ref 96–112)
Creatinine, Ser: 1 mg/dL (ref 0.40–1.20)
GFR: 59.31 mL/min — ABNORMAL LOW (ref >60.00)
Glucose, Bld: 121 mg/dL — ABNORMAL HIGH (ref 70–99)
Potassium: 3.9 mEq/L (ref 3.5–5.1)
Sodium: 139 mEq/L (ref 135–145)
Total Bilirubin: 0.5 mg/dL (ref 0.2–1.2)
Total Protein: 6.8 g/dL (ref 6.0–8.3)

## 2021-03-27 LAB — CELIAC DISEASE AB SCREEN W/RFX
Antigliadin Abs, IgA: 4 units (ref 0–19)
IgA/Immunoglobulin A, Serum: 276 mg/dL (ref 87–352)
Transglutaminase IgA: <2 U/mL (ref 0–3)

## 2021-03-27 LAB — CBC WITH DIFFERENTIAL/PLATELET
Basophils Absolute: 0 10*3/uL (ref 0.0–0.1)
Basophils Relative: 0.4 % (ref 0.0–3.0)
Eosinophils Absolute: 0.3 10*3/uL (ref 0.0–0.7)
Eosinophils Relative: 2.7 % (ref 0.0–5.0)
HCT: 36.8 % (ref 36.0–46.0)
Hemoglobin: 12.1 g/dL (ref 12.0–15.0)
Lymphocytes Relative: 24.2 % (ref 12.0–46.0)
Lymphs Abs: 2.4 10*3/uL (ref 0.7–4.0)
MCHC: 32.8 g/dL (ref 30.0–36.0)
MCV: 96.2 fl (ref 78.0–100.0)
Monocytes Absolute: 0.6 10*3/uL (ref 0.1–1.0)
Monocytes Relative: 6.3 % (ref 3.0–12.0)
Neutro Abs: 6.5 10*3/uL (ref 1.4–7.7)
Neutrophils Relative %: 66.4 % (ref 43.0–77.0)
Platelets: 432 10*3/uL — ABNORMAL HIGH (ref 150.0–400.0)
RBC: 3.83 Mil/uL — ABNORMAL LOW (ref 3.87–5.11)
RDW: 19.1 % — ABNORMAL HIGH (ref 11.5–15.5)
WBC: 9.7 10*3/uL (ref 4.0–10.5)

## 2021-03-27 LAB — B12 AND FOLATE PANEL
Folate: 16.4 ng/mL (ref >5.9)
Vitamin B-12: 66 pg/mL — ABNORMAL LOW (ref 211–911)

## 2021-03-27 LAB — URINE CULTURE
MICRO NUMBER:: 12850925
SPECIMEN QUALITY:: ADEQUATE

## 2021-03-27 LAB — URINALYSIS, MICROSCOPIC ONLY

## 2021-03-28 ENCOUNTER — Other Ambulatory Visit: Payer: Self-pay | Admitting: Family

## 2021-03-28 ENCOUNTER — Encounter: Payer: Self-pay | Admitting: Adult Health

## 2021-03-28 DIAGNOSIS — E538 Deficiency of other specified B group vitamins: Secondary | ICD-10-CM | POA: Insufficient documentation

## 2021-03-28 LAB — ANTI-PARIETAL ANTIBODY: PARIETAL CELL AB SCREEN: NEGATIVE

## 2021-03-28 LAB — INTRINSIC FACTOR ANTIBODIES: Intrinsic Factor: NEGATIVE

## 2021-03-28 NOTE — Progress Notes (Signed)
CMP ok non fasting glucose is elevated.  Urinalysis shows no specific bacteria.  Vaginal swab is still pending results.  Notes recorded by Mable Paris on 03/28/2021 at 3:38 PM EST CALL- Sch b12 weekly x 3 more weeks and then monthly Sch labs in 6 weeks. I have ordered Let me know if cannot reach patient.

## 2021-03-29 ENCOUNTER — Ambulatory Visit (INDEPENDENT_AMBULATORY_CARE_PROVIDER_SITE_OTHER): Payer: Medicare Other

## 2021-03-29 ENCOUNTER — Other Ambulatory Visit: Payer: Self-pay

## 2021-03-29 DIAGNOSIS — E538 Deficiency of other specified B group vitamins: Secondary | ICD-10-CM

## 2021-03-29 LAB — CERVICOVAGINAL ANCILLARY ONLY
Bacterial Vaginitis (gardnerella): NEGATIVE
Candida Glabrata: NEGATIVE
Candida Vaginitis: POSITIVE — AB
Comment: NEGATIVE
Comment: NEGATIVE
Comment: NEGATIVE

## 2021-03-29 MED ORDER — CYANOCOBALAMIN 1000 MCG/ML IJ SOLN
1000.0000 ug | Freq: Once | INTRAMUSCULAR | Status: AC
  Administered 2021-03-29: 1000 ug via INTRAMUSCULAR

## 2021-03-29 NOTE — Progress Notes (Signed)
Patient presented for B 12 injection to left deltoid, patient voiced no concerns nor showed any signs of distress during injection. 

## 2021-03-30 ENCOUNTER — Other Ambulatory Visit: Payer: Self-pay | Admitting: Adult Health

## 2021-03-30 DIAGNOSIS — B3731 Acute candidiasis of vulva and vagina: Secondary | ICD-10-CM | POA: Insufficient documentation

## 2021-03-30 MED ORDER — TERCONAZOLE 0.4 % VA CREA: 1.0000 | TOPICAL_CREAM | Freq: Every day | VAGINAL | 0 refills | Status: AC

## 2021-03-30 NOTE — Progress Notes (Signed)
Meds ordered this encounter  Medications   terconazole (TERAZOL 7) 0.4 % vaginal cream    Sig: Place 1 applicator vaginally at bedtime for 7 days.    Dispense:  45 g    Refill:  0

## 2021-03-30 NOTE — Progress Notes (Signed)
Yeast on vaginal swab. Meds ordered this encounter Medications  terconazole (TERAZOL 7) 0.4 % vaginal cream   Sig: Place 1 applicator vaginally at bedtime for 7 days.   Dispense:  45 g   Refill:  0 Sent to pharmacy. Follow treatment plan from office  if not improving or any worsening within 72 hours and also return to office or open medical facility at ANYTIME if any symptoms persist, change, or worsen or you have any further concerns or questions. Call 911 immediately for emergencies.

## 2021-04-05 ENCOUNTER — Other Ambulatory Visit: Payer: Self-pay

## 2021-04-05 ENCOUNTER — Ambulatory Visit (INDEPENDENT_AMBULATORY_CARE_PROVIDER_SITE_OTHER): Payer: Medicare Other

## 2021-04-05 DIAGNOSIS — E538 Deficiency of other specified B group vitamins: Secondary | ICD-10-CM

## 2021-04-05 MED ORDER — CYANOCOBALAMIN 1000 MCG/ML IJ SOLN
1000.0000 ug | Freq: Once | INTRAMUSCULAR | Status: AC
  Administered 2021-04-05: 1000 ug via INTRAMUSCULAR

## 2021-04-05 NOTE — Progress Notes (Signed)
Patient presented for B 12 injection to left deltoid, patient voiced no concerns nor showed any signs of distress during injection. 

## 2021-04-12 ENCOUNTER — Ambulatory Visit (INDEPENDENT_AMBULATORY_CARE_PROVIDER_SITE_OTHER): Payer: Medicare Other | Admitting: *Deleted

## 2021-04-12 ENCOUNTER — Other Ambulatory Visit: Payer: Self-pay

## 2021-04-12 DIAGNOSIS — E538 Deficiency of other specified B group vitamins: Secondary | ICD-10-CM

## 2021-04-12 MED ORDER — CYANOCOBALAMIN 1000 MCG/ML IJ SOLN
1000.0000 ug | Freq: Once | INTRAMUSCULAR | Status: AC
  Administered 2021-04-12: 1000 ug via INTRAMUSCULAR

## 2021-04-12 NOTE — Progress Notes (Signed)
Patient presented for B 12 injection to right deltoid, patient voiced no concerns nor showed any signs of distress during injection. 

## 2021-04-16 DIAGNOSIS — M25562 Pain in left knee: Secondary | ICD-10-CM | POA: Diagnosis not present

## 2021-04-19 ENCOUNTER — Other Ambulatory Visit: Payer: Self-pay

## 2021-04-19 ENCOUNTER — Ambulatory Visit (INDEPENDENT_AMBULATORY_CARE_PROVIDER_SITE_OTHER): Payer: Medicare Other | Admitting: *Deleted

## 2021-04-19 DIAGNOSIS — Z1231 Encounter for screening mammogram for malignant neoplasm of breast: Secondary | ICD-10-CM | POA: Diagnosis not present

## 2021-04-19 DIAGNOSIS — E538 Deficiency of other specified B group vitamins: Secondary | ICD-10-CM

## 2021-04-19 LAB — HM MAMMOGRAPHY

## 2021-04-22 DIAGNOSIS — E119 Type 2 diabetes mellitus without complications: Secondary | ICD-10-CM | POA: Diagnosis not present

## 2021-04-22 DIAGNOSIS — M170 Bilateral primary osteoarthritis of knee: Secondary | ICD-10-CM | POA: Diagnosis not present

## 2021-04-22 DIAGNOSIS — M1712 Unilateral primary osteoarthritis, left knee: Secondary | ICD-10-CM | POA: Diagnosis not present

## 2021-04-29 MED ORDER — CYANOCOBALAMIN 1000 MCG/ML IJ SOLN
1000.0000 ug | Freq: Once | INTRAMUSCULAR | Status: AC
  Administered 2021-04-19: 1000 ug via INTRAMUSCULAR

## 2021-04-29 NOTE — Progress Notes (Signed)
Patient presented for B 12 injection to right deltoid, patient voiced no concerns nor showed any signs of distress during injection. 

## 2021-05-02 DIAGNOSIS — H524 Presbyopia: Secondary | ICD-10-CM | POA: Diagnosis not present

## 2021-05-02 DIAGNOSIS — D3132 Benign neoplasm of left choroid: Secondary | ICD-10-CM | POA: Diagnosis not present

## 2021-05-02 LAB — HM DIABETES EYE EXAM

## 2021-05-03 ENCOUNTER — Telehealth: Payer: Self-pay | Admitting: Family Medicine

## 2021-05-03 ENCOUNTER — Encounter: Payer: Self-pay | Admitting: Family Medicine

## 2021-05-03 ENCOUNTER — Ambulatory Visit (INDEPENDENT_AMBULATORY_CARE_PROVIDER_SITE_OTHER): Payer: Medicare Other | Admitting: Family Medicine

## 2021-05-03 ENCOUNTER — Other Ambulatory Visit: Payer: Self-pay

## 2021-05-03 ENCOUNTER — Ambulatory Visit
Admission: RE | Admit: 2021-05-03 | Discharge: 2021-05-03 | Disposition: A | Discharge status: Home / Self Care | Payer: Medicare Other | Attending: Family Medicine | Admitting: Family Medicine

## 2021-05-03 ENCOUNTER — Ambulatory Visit
Admission: RE | Admit: 2021-05-03 | Discharge: 2021-05-03 | Disposition: A | Discharge status: Home / Self Care | Payer: Medicare Other | Source: Ambulatory Visit | Attending: Family Medicine | Admitting: Family Medicine

## 2021-05-03 DIAGNOSIS — R0789 Other chest pain: Secondary | ICD-10-CM

## 2021-05-03 DIAGNOSIS — R0781 Pleurodynia: Secondary | ICD-10-CM | POA: Insufficient documentation

## 2021-05-03 DIAGNOSIS — E1169 Type 2 diabetes mellitus with other specified complication: Secondary | ICD-10-CM

## 2021-05-03 DIAGNOSIS — R232 Flushing: Secondary | ICD-10-CM | POA: Diagnosis not present

## 2021-05-03 DIAGNOSIS — E669 Obesity, unspecified: Secondary | ICD-10-CM | POA: Diagnosis not present

## 2021-05-03 DIAGNOSIS — R911 Solitary pulmonary nodule: Secondary | ICD-10-CM

## 2021-05-03 DIAGNOSIS — E041 Nontoxic single thyroid nodule: Secondary | ICD-10-CM | POA: Diagnosis not present

## 2021-05-03 DIAGNOSIS — E538 Deficiency of other specified B group vitamins: Secondary | ICD-10-CM

## 2021-05-03 DIAGNOSIS — E119 Type 2 diabetes mellitus without complications: Secondary | ICD-10-CM

## 2021-05-03 DIAGNOSIS — I10 Essential (primary) hypertension: Secondary | ICD-10-CM

## 2021-05-03 LAB — LIPID PANEL
Cholesterol: 127 mg/dL (ref 0–200)
HDL: 46.4 mg/dL (ref >39.00)
NonHDL: 80.33
Total CHOL/HDL Ratio: 3
Triglycerides: 217 mg/dL — ABNORMAL HIGH (ref 0.0–149.0)
VLDL: 43.4 mg/dL — ABNORMAL HIGH (ref 0.0–40.0)

## 2021-05-03 LAB — COMPREHENSIVE METABOLIC PANEL
ALT: 13 U/L (ref 0–35)
AST: 11 U/L (ref 0–37)
Albumin: 4.3 g/dL (ref 3.5–5.2)
Alkaline Phosphatase: 67 U/L (ref 39–117)
BUN: 23 mg/dL (ref 6–23)
CO2: 26 mEq/L (ref 19–32)
Calcium: 9.7 mg/dL (ref 8.4–10.5)
Chloride: 105 mEq/L (ref 96–112)
Creatinine, Ser: 1.18 mg/dL (ref 0.40–1.20)
GFR: 48.59 mL/min — ABNORMAL LOW (ref >60.00)
Glucose, Bld: 144 mg/dL — ABNORMAL HIGH (ref 70–99)
Potassium: 3.9 mEq/L (ref 3.5–5.1)
Sodium: 140 mEq/L (ref 135–145)
Total Bilirubin: 0.6 mg/dL (ref 0.2–1.2)
Total Protein: 6.5 g/dL (ref 6.0–8.3)

## 2021-05-03 LAB — LDL CHOLESTEROL, DIRECT: Direct LDL: 50 mg/dL

## 2021-05-03 LAB — HEMOGLOBIN A1C: Hgb A1c MFr Bld: 7.8 % — ABNORMAL HIGH (ref 4.6–6.5)

## 2021-05-03 LAB — CBC
HCT: 37.6 % (ref 36.0–46.0)
Hemoglobin: 12.6 g/dL (ref 12.0–15.0)
MCHC: 33.7 g/dL (ref 30.0–36.0)
MCV: 94.1 fl (ref 78.0–100.0)
Platelets: 400 10*3/uL (ref 150.0–400.0)
RBC: 3.99 Mil/uL (ref 3.87–5.11)
RDW: 16.6 % — ABNORMAL HIGH (ref 11.5–15.5)
WBC: 10.1 10*3/uL (ref 4.0–10.5)

## 2021-05-03 LAB — TSH: TSH: 0.72 u[IU]/mL (ref 0.35–5.50)

## 2021-05-03 MED ORDER — CYANOCOBALAMIN 1000 MCG/ML IJ SOLN
1000.0000 ug | Freq: Once | INTRAMUSCULAR | Status: AC
  Administered 2021-05-03: 1000 ug via INTRAMUSCULAR

## 2021-05-03 NOTE — Assessment & Plan Note (Signed)
Check A1c.  She will continue Jardiance 25 mg daily and metformin 1000 mg twice daily.

## 2021-05-03 NOTE — Assessment & Plan Note (Signed)
B12 injection given today. 

## 2021-05-03 NOTE — Patient Instructions (Signed)
Nice to see you. Someone should contact you to schedule the CT scan of your chest and the thyroid ultrasound. You will need to go to the outpatient imaging center off of [address] to have your x-ray done. We will contact you with your lab results.

## 2021-05-03 NOTE — Progress Notes (Signed)
Shelby Estes presents today for injection per MD orders. B12 injection administered IM in left Upper Arm. Administration without incident. Patient tolerated well. Mikhaela Zaugg,cma

## 2021-05-03 NOTE — Progress Notes (Signed)
Tommi Rumps, MD Phone: [phone]  Shelby Estes is a 66 y.o. female who presents today for f/u.  HYPERTENSION Disease Monitoring Home BP Monitoring not checking often Chest pain- no    Dyspnea- no Medications Compliance-  taking lasix, HCTZ, losartan, metoprolol.   BMET    Component Value Date/Time   NA 139 03/26/2021 1615   NA 144 01/31/2020 0832   NA 140 09/22/2013 1523   K 3.9 03/26/2021 1615   K 3.8 09/22/2013 1523   CL 101 03/26/2021 1615   CL 104 09/22/2013 1523   CO2 28 03/26/2021 1615   CO2 28 09/22/2013 1523   GLUCOSE 121 (H) 03/26/2021 1615   GLUCOSE 105 (H) 09/22/2013 1523   BUN 21 03/26/2021 1615   BUN 11 01/31/2020 0832   BUN 10 09/22/2013 1523   CREATININE 1.00 03/26/2021 1615   CREATININE 0.87 09/22/2013 1523   CALCIUM 9.8 03/26/2021 1615   CALCIUM 9.8 09/22/2013 1523   GFRNONAA 57 (L) 01/31/2020 0832   GFRNONAA >60 09/22/2013 1523   GFRAA 66 01/31/2020 0832   GFRAA >60 09/22/2013 1523   DIABETES Disease Monitoring: Blood Sugar ranges-110 pm, 150s-160s am Polyuria/phagia/dipsia- no      Optho- saw yesterday Medications: Compliance- taking jardiance, metformin Hypoglycemic symptoms- one time a week she feels as though it is low and it will be in the low 100s The patient has shifted to a lower carb diet and has cut out sodas.  She is trying to lose weight to have a knee surgery.  Right rib pain: This has been going on a couple of months.  It hurts in the right anterior lower ribs and around to her posterior lower ribs on the right.  No injury.  The discomfort has been increasing over the last several weeks or so.  Notes it hurts a little bit if she moves the wrong way.  She had no cough or hemoptysis.  Hot flashes: This has been going on for years.  She will feel hot in the back of her head and then on her forehead.  Then she will have some sweating.  Mostly occurs during the daytime.  Lung nodule: This needs to be followed up on.  She is not a  smoker.   Social History   Tobacco Use  Smoking Status Never  Smokeless Tobacco Never    Current Outpatient Medications on File Prior to Visit  Medication Sig Dispense Refill   ADULT ASPIRIN REGIMEN 81 MG EC tablet      Cholecalciferol (VITAMIN D) 2000 UNITS CAPS Take 1 capsule by mouth daily.     DULoxetine (CYMBALTA) 30 MG capsule TAKE 2 CAPSULES EVERY DAY AS DIRECTED 180 capsule 1   empagliflozin (JARDIANCE) 25 MG TABS tablet Take 1 tablet (25 mg total) by mouth daily before breakfast. 90 tablet 1   fluticasone (FLONASE) 50 MCG/ACT nasal spray Place 2 sprays into both nostrils daily. 16 g 6   furosemide (LASIX) 20 MG tablet      hydrochlorothiazide (HYDRODIURIL) 12.5 MG tablet hydrochlorothiazide 12.5 mg tablet     losartan (COZAAR) 100 MG tablet losartan 100 mg tablet     meloxicam (MOBIC) 15 MG tablet Take 15 mg by mouth daily.      metFORMIN (GLUCOPHAGE) 1000 MG tablet Take 1 tablet (1,000 mg total) by mouth 2 (two) times daily with a meal. 180 tablet 1   metoprolol tartrate (LOPRESSOR) 25 MG tablet Take 1 tablet (25 mg total) by mouth 2 (two) times daily.  60 tablet 6   pantoprazole (PROTONIX) 40 MG tablet TAKE ONE TABLET BY MOUTH TWICE DAILY BEFORE MEALS 60 tablet 3   rosuvastatin (CRESTOR) 10 MG tablet Take 1 tablet (10 mg total) by mouth daily. 90 tablet 1   No current facility-administered medications on file prior to visit.     ROS see history of present illness  Objective  Physical Exam Vitals:   05/03/21 0944  BP: 120/70  Pulse: 69  Temp: 98.9 F (37.2 C)  SpO2: 98%    BP Readings from Last 3 Encounters:  05/03/21 120/70  03/26/21 126/88  05/02/20 125/80   Wt Readings from Last 3 Encounters:  05/03/21 236 lb (107 kg)  03/26/21 240 lb 12.8 oz (109.2 kg)  05/02/20 237 lb (107.5 kg)    Physical Exam Constitutional:      General: She is not in acute distress.    Appearance: She is not diaphoretic.  Neck:     Thyroid: No thyromegaly or thyroid  tenderness.     Comments: Possible right-sided thyroid nodule noted Cardiovascular:     Rate and Rhythm: Normal rate and regular rhythm.     Heart sounds: Normal heart sounds.  Pulmonary:     Effort: Pulmonary effort is normal.     Breath sounds: Normal breath sounds.  Musculoskeletal:     Comments: Right anterior lower ribs around to the side and posterior lower ribs on the right have some tenderness  Skin:    General: Skin is warm and dry.  Neurological:     Mental Status: She is alert.     Assessment/Plan: Please see individual problem list.  Problem List Items Addressed This Visit     B12 deficiency    B12 injection given today.      Diabetes mellitus type 2 in obese (HCC)    Check A1c.  She will continue Jardiance 25 mg daily and metformin 1000 mg twice daily.      Relevant Orders   Comp Met (CMET)   Lipid panel   HgB A1c   Hot flashes    Likely related to being postmenopausal.  We will check lab work to evaluate for other causes.  Discussed the potential for starting nonhormonal medication for treatment if her labs do not reveal a cause.      Relevant Orders   CBC   TSH   Hypertension    Well-controlled today.  She will continue Lasix 20 mg daily, HCTZ 12.5 mg daily, losartan 100 mg daily, and metoprolol 25 mg twice daily.  Check lab work.      Relevant Orders   Comp Met (CMET)   Lipid panel   Lung nodule    Follow-up CT scan ordered.      Relevant Orders   CT Chest Wo Contrast   Rib pain on right side    Possibly related to intercostal muscular strain.  We will get an x-ray given that this has been going on several months.      Relevant Orders   DG Ribs Unilateral Right   Thyroid nodule    Possible thyroid nodule noted on exam.  We will obtain a thyroid ultrasound.      Relevant Orders   US THYROID    Return in about 3 months (around 07/31/2021) for diabetes f/u.  This visit occurred during the SARS-CoV-2 public health emergency.  Safety  protocols were in place, including screening questions prior to the visit, additional usage of staff PPE, and extensive cleaning  of exam room while observing appropriate contact time as indicated for disinfecting solutions.    Tommi Rumps, MD [place]

## 2021-05-03 NOTE — Assessment & Plan Note (Signed)
Possibly related to intercostal muscular strain.  We will get an x-ray given that this has been going on several months.

## 2021-05-03 NOTE — Telephone Encounter (Signed)
Patient returned referrals phone call. 

## 2021-05-03 NOTE — Assessment & Plan Note (Signed)
Well-controlled today.  She will continue Lasix 20 mg daily, HCTZ 12.5 mg daily, losartan 100 mg daily, and metoprolol 25 mg twice daily.  Check lab work.

## 2021-05-03 NOTE — Addendum Note (Signed)
Addended by: Fulton Mole D on: 05/03/2021 10:45 AM   Modules accepted: Orders

## 2021-05-03 NOTE — Assessment & Plan Note (Signed)
Possible thyroid nodule noted on exam.  We will obtain a thyroid ultrasound.

## 2021-05-03 NOTE — Assessment & Plan Note (Addendum)
Likely related to being postmenopausal.  We will check lab work to evaluate for other causes.  Discussed the potential for starting nonhormonal medication for treatment if her labs do not reveal a cause.

## 2021-05-03 NOTE — Telephone Encounter (Signed)
Lft pt vm to ofc to sch [place]. thanks

## 2021-05-03 NOTE — Assessment & Plan Note (Signed)
Follow-up CT scan ordered.

## 2021-05-03 NOTE — Telephone Encounter (Signed)
And CT

## 2021-05-04 ENCOUNTER — Encounter: Payer: Self-pay | Admitting: Family Medicine

## 2021-05-06 ENCOUNTER — Encounter: Payer: Self-pay | Admitting: Family Medicine

## 2021-05-06 ENCOUNTER — Telehealth: Payer: Self-pay

## 2021-05-06 NOTE — Telephone Encounter (Signed)
Mychart result note sent to patient and read by patient on 05/06/21.

## 2021-05-06 NOTE — Telephone Encounter (Signed)
Forwarded to dr Caryl Bis

## 2021-05-07 NOTE — Telephone Encounter (Signed)
Please see the My-Chart message.

## 2021-05-09 ENCOUNTER — Other Ambulatory Visit: Payer: Self-pay | Admitting: Family Medicine

## 2021-05-09 DIAGNOSIS — N179 Acute kidney failure, unspecified: Secondary | ICD-10-CM

## 2021-05-10 MED ORDER — GABAPENTIN 100 MG PO CAPS: 100.0000 mg | ORAL_CAPSULE | Freq: Three times a day (TID) | ORAL | 3 refills | Status: DC

## 2021-05-16 ENCOUNTER — Other Ambulatory Visit (INDEPENDENT_AMBULATORY_CARE_PROVIDER_SITE_OTHER): Payer: Medicare Other

## 2021-05-16 ENCOUNTER — Other Ambulatory Visit: Payer: Self-pay

## 2021-05-16 DIAGNOSIS — N179 Acute kidney failure, unspecified: Secondary | ICD-10-CM | POA: Diagnosis not present

## 2021-05-17 LAB — BASIC METABOLIC PANEL
BUN: 28 mg/dL — ABNORMAL HIGH (ref 6–23)
CO2: 29 mEq/L (ref 19–32)
Calcium: 9.7 mg/dL (ref 8.4–10.5)
Chloride: 101 mEq/L (ref 96–112)
Creatinine, Ser: 1.22 mg/dL — ABNORMAL HIGH (ref 0.40–1.20)
GFR: 46.67 mL/min — ABNORMAL LOW (ref >60.00)
Glucose, Bld: 111 mg/dL — ABNORMAL HIGH (ref 70–99)
Potassium: 3.9 mEq/L (ref 3.5–5.1)
Sodium: 142 mEq/L (ref 135–145)

## 2021-05-19 ENCOUNTER — Encounter: Payer: Self-pay | Admitting: Family Medicine

## 2021-05-21 ENCOUNTER — Other Ambulatory Visit: Payer: Self-pay

## 2021-05-21 ENCOUNTER — Ambulatory Visit
Admission: RE | Admit: 2021-05-21 | Discharge: 2021-05-21 | Disposition: A | Discharge status: Home / Self Care | Payer: Medicare Other | Source: Ambulatory Visit | Attending: Family Medicine | Admitting: Family Medicine

## 2021-05-21 DIAGNOSIS — E041 Nontoxic single thyroid nodule: Secondary | ICD-10-CM | POA: Insufficient documentation

## 2021-05-21 DIAGNOSIS — R911 Solitary pulmonary nodule: Secondary | ICD-10-CM | POA: Diagnosis not present

## 2021-05-22 ENCOUNTER — Encounter: Payer: Self-pay | Admitting: Family Medicine

## 2021-05-22 ENCOUNTER — Telehealth: Payer: Self-pay

## 2021-05-22 DIAGNOSIS — N289 Disorder of kidney and ureter, unspecified: Secondary | ICD-10-CM

## 2021-05-22 DIAGNOSIS — R911 Solitary pulmonary nodule: Secondary | ICD-10-CM

## 2021-05-22 DIAGNOSIS — J988 Other specified respiratory disorders: Secondary | ICD-10-CM

## 2021-05-22 NOTE — Telephone Encounter (Signed)
Radiologist wanted to make sure  the Ct results were read, non urgent.  Jeniffer Culliver,cma  ?

## 2021-05-22 NOTE — Telephone Encounter (Signed)
Please of the patient know that her thyroid ultrasound did not reveal any suspicious nodules.  She does have a multinodular thyroid gland.  No further follow-up is needed at this time. ? ?Her CT chest revealed a small hiatal hernia.  She had a lung nodule noted as well.  There was also possible findings of pneumonitis in the lower lateral lower lobe of her right lung.  Has she had a recent respiratory infection or cough?  The radiologist recommended follow-up CT imaging in 3 to 6 months.  I can get that ordered if she is okay with that. ? ?The CT scan also revealed her spleen is mildly enlarged.  There is also a lesion in her right kidney that could be a cyst or something else.  They recommended an ultrasound to help characterize this further.  I can also order that. ?

## 2021-05-22 NOTE — Telephone Encounter (Signed)
I called the patient and informed her of her Ct and [place] results and she understood.  Patient stated she does have a cough and it feels like mucous is coming up in her throat.  She is ok with the [place] and the MRI being ordered, she also asked me to send the results to Dr. Humphrey Rolls at [place] and I did that through epic.  Kiesha Ensey,cma  ?

## 2021-05-23 ENCOUNTER — Encounter: Payer: Self-pay | Admitting: Family Medicine

## 2021-05-23 ENCOUNTER — Other Ambulatory Visit: Payer: Self-pay

## 2021-05-23 DIAGNOSIS — E669 Obesity, unspecified: Secondary | ICD-10-CM

## 2021-05-23 MED ORDER — METFORMIN HCL 1000 MG PO TABS: 500.0000 mg | ORAL_TABLET | Freq: Two times a day (BID) | ORAL | 1 refills | Status: DC

## 2021-05-23 MED ORDER — DOXYCYCLINE HYCLATE 100 MG PO TABS: 100.0000 mg | ORAL_TABLET | Freq: Two times a day (BID) | ORAL | 0 refills | Status: DC

## 2021-05-23 NOTE — Telephone Encounter (Signed)
I called the patient and informed her that the provider sent in a antibiotic for her and she understood, she is scheduled for a U/s  of kidneys and  her  MRI for next year is scheduled.  Tehran Rabenold,cma  ?

## 2021-05-23 NOTE — Telephone Encounter (Signed)
Imaging ordered. I sent in an antibiotic for her to take for the possible lung infection. If he cough does not improve with this she should let us know.  ?

## 2021-06-04 ENCOUNTER — Ambulatory Visit
Admission: RE | Admit: 2021-06-04 | Discharge: 2021-06-04 | Disposition: A | Discharge status: Home / Self Care | Payer: Medicare Other | Source: Ambulatory Visit | Attending: Family Medicine | Admitting: Family Medicine

## 2021-06-04 ENCOUNTER — Other Ambulatory Visit: Payer: Self-pay

## 2021-06-04 DIAGNOSIS — N281 Cyst of kidney, acquired: Secondary | ICD-10-CM | POA: Diagnosis not present

## 2021-06-04 DIAGNOSIS — Q6102 Congenital multiple renal cysts: Secondary | ICD-10-CM | POA: Diagnosis not present

## 2021-06-04 DIAGNOSIS — N289 Disorder of kidney and ureter, unspecified: Secondary | ICD-10-CM | POA: Insufficient documentation

## 2021-06-06 ENCOUNTER — Encounter: Payer: Self-pay | Admitting: Family Medicine

## 2021-06-12 ENCOUNTER — Other Ambulatory Visit: Payer: Self-pay | Admitting: Family Medicine

## 2021-06-12 DIAGNOSIS — N2889 Other specified disorders of kidney and ureter: Secondary | ICD-10-CM

## 2021-06-13 ENCOUNTER — Ambulatory Visit (INDEPENDENT_AMBULATORY_CARE_PROVIDER_SITE_OTHER): Payer: Medicare Other

## 2021-06-13 DIAGNOSIS — E538 Deficiency of other specified B group vitamins: Secondary | ICD-10-CM | POA: Diagnosis not present

## 2021-06-13 MED ORDER — CYANOCOBALAMIN 1000 MCG/ML IJ SOLN
1000.0000 ug | Freq: Once | INTRAMUSCULAR | Status: AC
  Administered 2021-06-13: 1000 ug via INTRAMUSCULAR

## 2021-06-13 NOTE — Progress Notes (Signed)
Pt arrived for B12 injection, given in L deltoid. Pt tolerated injection well showed no signs of distress nor voiced any concerns.  ?

## 2021-06-19 ENCOUNTER — Encounter: Payer: Self-pay | Admitting: Family Medicine

## 2021-06-20 ENCOUNTER — Telehealth: Payer: Self-pay | Admitting: Family Medicine

## 2021-06-20 NOTE — Telephone Encounter (Signed)
Sharyn Lull From [facility] called in stating that they have received a fax from our office requesting for medical records on pt... Sharyn Lull stated that the fax was sent to the incorrect place... Sharyn Lull stated that they do not have a provider at there office named Olena Heckle... Sharyn Lull stated that the information that was faxed over was supposed to be sent to [facility].Marland KitchenMarland Kitchen  ?

## 2021-06-26 ENCOUNTER — Ambulatory Visit (INDEPENDENT_AMBULATORY_CARE_PROVIDER_SITE_OTHER): Payer: Medicare Other | Admitting: Family Medicine

## 2021-06-26 DIAGNOSIS — E669 Obesity, unspecified: Secondary | ICD-10-CM | POA: Diagnosis not present

## 2021-06-26 DIAGNOSIS — E1169 Type 2 diabetes mellitus with other specified complication: Secondary | ICD-10-CM | POA: Diagnosis not present

## 2021-06-26 MED ORDER — OZEMPIC (0.25 OR 0.5 MG/DOSE) 2 MG/1.5ML ~~LOC~~ SOPN: PEN_INJECTOR | SUBCUTANEOUS | 0 refills | Status: DC

## 2021-06-26 NOTE — Assessment & Plan Note (Signed)
I encouraged continued dietary changes.  I encouraged her to stay as active as she can.  Discussed she would likely lose some weight being on the Ozempic. ?

## 2021-06-26 NOTE — Patient Instructions (Signed)
Nice to speak with you. ?I sent Ozempic in for you to try for your diabetes.  If you develop excessive nausea or you have any abdominal pain you need to discontinue the medication and let us know. ?Please continue to work on diet and exercise. ?

## 2021-06-26 NOTE — Assessment & Plan Note (Signed)
Poorly controlled.  We will start on Ozempic.  She did have some reflux and vomiting when on Rybelsus so I discussed that those things could happen with the Ozempic and if they do she should discontinue it.  She reported no personal or family history of thyroid cancer, parathyroid cancer, or adrenal gland cancer.  She reports no history of pancreatitis.  She is status postcholecystectomy.  I discussed the risk of pancreatitis with Ozempic.  Discussed medullary thyroid cancer has been seen in rats studies though this does not seem to translate to a risk in humans.  She was advised to monitor her thyroid area and if she notices any changes she will let us know.  If she has excessive nausea or any other side effects with the Ozempic she will let us know ?

## 2021-06-26 NOTE — Progress Notes (Signed)
? ?Virtual Visit via telephone Note ? ?This visit type was conducted due to national recommendations for restrictions regarding the COVID-19 pandemic (e.g. social distancing).  This format is felt to be most appropriate for this patient at this time.  All issues noted in this document were discussed and addressed.  No physical exam was performed (except for noted visual exam findings with Video Visits).  ? ?I connected with Shelby Estes today at  1:45 PM EDT by telephone and verified that I am speaking with the correct person using two identifiers. ?Location patient: home ?Location provider: work  ?Persons participating in the virtual visit: patient, provider ? ?I discussed the limitations, risks, security and privacy concerns of performing an evaluation and management service by telephone and the availability of in person appointments. I also discussed with the patient that there may be a patient responsible charge related to this service. The patient expressed understanding and agreed to proceed. ? ?Interactive audio and video telecommunications were attempted between this provider and patient, however failed, due to patient having technical difficulties OR patient did not have access to video capability.  We continued and completed visit with audio only.  ? ?Reason for visit: f/u. ? ?HPI: ?DIABETES ?Disease Monitoring: ?Blood Sugar ranges-140-170 fasting Polyuria/phagia/dipsia- no      Optho- UTD ?Medications: ?Compliance- taking metformin Hypoglycemic symptoms- no ? ?Obesity: Patient reports she is working on cutting down on sodas.  She is only having 1 soda 1-2 times a week.  She is drinking mostly water.  She tried a low carbohydrate diet and lost a couple of pounds though not much more.  She tried Nutrisystem meals for 3 weeks and that was not beneficial.  She has not been able to exercise much given chronic knee issues that she is trying to walk more. ? ?ROS: See pertinent positives and negatives per  HPI. ? ?Past Medical History:  ?Diagnosis Date  ? Anxiety   ? Depression   ? Diabetes mellitus 2010  ? Elevated BP   ? GERD (gastroesophageal reflux disease)   ? Hypertension   ? Kidney stone   ? Dr. Cyndie Mull, April 2014  ? Renal cyst   ? Dr. Cyndie Mull  ? SVT (supraventricular tachycardia) [facility])   ? s/p cardioversion  ? Vitamin D deficiency   ? ? ?Past Surgical History:  ?Procedure Laterality Date  ? ABDOMINAL HYSTERECTOMY  09/27/13  ? Dr. Veryl Speak  ? Arm Surgery    ? due to fracture left elbow  ? CHOLECYSTECTOMY  5/07  ? Dr Bary Castilla  ? COLONOSCOPY  2007  ? Dr Bary Castilla  ? COLONOSCOPY WITH PROPOFOL N/A 06/20/2015  ? Procedure: COLONOSCOPY WITH PROPOFOL;  Surgeon: [name], MD;  Location: [facility] ENDOSCOPY;  Service: Endoscopy;  Laterality: N/A;  ? TUBAL LIGATION  02-21-01  ? ? ?Family History  ?Problem Relation Age of Onset  ? COPD Father   ? Stroke Father   ? Diabetes Mother   ?     borderline  ? Hypertension Mother   ? Thyroid disease Mother   ? Breast cancer Maternal Aunt   ? Seizures Maternal Grandmother   ? Breast cancer Other   ? Cancer Other   ?     breast  ? Colon cancer Sister   ?     62  ? Colon cancer Cousin   ?     50's  ? ? ?SOCIAL HX: Non-smoker ? ? ?Current Outpatient Medications:  ?  ADULT ASPIRIN REGIMEN 81 MG  EC tablet, , Disp: , Rfl:  ?  Cholecalciferol (VITAMIN D) 2000 UNITS CAPS, Take 1 capsule by mouth daily., Disp: , Rfl:  ?  doxycycline (VIBRA-TABS) 100 MG tablet, Take 1 tablet (100 mg total) by mouth 2 (two) times daily., Disp: 14 tablet, Rfl: 0 ?  DULoxetine (CYMBALTA) 30 MG capsule, TAKE 2 CAPSULES EVERY DAY AS DIRECTED, Disp: 180 capsule, Rfl: 1 ?  empagliflozin (JARDIANCE) 25 MG TABS tablet, Take 1 tablet (25 mg total) by mouth daily before breakfast., Disp: 90 tablet, Rfl: 1 ?  fluticasone (FLONASE) 50 MCG/ACT nasal spray, Place 2 sprays into both nostrils daily., Disp: 16 g, Rfl: 6 ?  furosemide (LASIX) 20 MG tablet, , Disp: , Rfl:  ?  hydrochlorothiazide  (HYDRODIURIL) 12.5 MG tablet, hydrochlorothiazide 12.5 mg tablet, Disp: , Rfl:  ?  losartan (COZAAR) 100 MG tablet, losartan 100 mg tablet, Disp: , Rfl:  ?  meloxicam (MOBIC) 15 MG tablet, Take 15 mg by mouth daily. , Disp: , Rfl:  ?  metFORMIN (GLUCOPHAGE) 1000 MG tablet, Take 0.5 tablets (500 mg total) by mouth 2 (two) times daily with a meal., Disp: 180 tablet, Rfl: 1 ?  metoprolol tartrate (LOPRESSOR) 25 MG tablet, Take 1 tablet (25 mg total) by mouth 2 (two) times daily., Disp: 60 tablet, Rfl: 6 ?  pantoprazole (PROTONIX) 40 MG tablet, TAKE ONE TABLET BY MOUTH TWICE DAILY BEFORE MEALS, Disp: 60 tablet, Rfl: 3 ?  rosuvastatin (CRESTOR) 10 MG tablet, Take 1 tablet (10 mg total) by mouth daily., Disp: 90 tablet, Rfl: 1 ?  Semaglutide,0.25 or 0.'5MG'$ /DOS, (OZEMPIC, 0.25 OR 0.5 MG/DOSE,) 2 MG/1.5ML SOPN, Inject 0.25 mg into the skin once a week for 28 days, THEN 0.5 mg once a week., Disp: 4.5 mL, Rfl: 0 ?  gabapentin (NEURONTIN) 100 MG capsule, Take 1 capsule (100 mg total) by mouth 3 (three) times daily. (Patient not taking: Reported on 06/26/2021), Disp: 90 capsule, Rfl: 3 ? ?EXAM: ?This was a telephone visit and thus no exam was completed. ? ?ASSESSMENT AND PLAN: ? ?Discussed the following assessment and plan: ? ?Problem List Items Addressed This Visit   ? ? Diabetes mellitus type 2 in obese [facility])  ?  Poorly controlled.  We will start on Ozempic.  She did have some reflux and vomiting when on Rybelsus so I discussed that those things could happen with the Ozempic and if they do she should discontinue it.  She reported no personal or family history of thyroid cancer, parathyroid cancer, or adrenal gland cancer.  She reports no history of pancreatitis.  She is status postcholecystectomy.  I discussed the risk of pancreatitis with Ozempic.  Discussed medullary thyroid cancer has been seen in rats studies though this does not seem to translate to a risk in humans.  She was advised to monitor her thyroid area and if she  notices any changes she will let us know.  If she has excessive nausea or any other side effects with the Ozempic she will let us know ?  ?  ? Relevant Medications  ? Semaglutide,0.25 or 0.'5MG'$ /DOS, (OZEMPIC, 0.25 OR 0.5 MG/DOSE,) 2 MG/1.5ML SOPN  ? Obesity, Class III, BMI 40-49.9 (morbid obesity) ([place])  ?  I encouraged continued dietary changes.  I encouraged her to stay as active as she can.  Discussed she would likely lose some weight being on the Ozempic. ?  ?  ? Relevant Medications  ? Semaglutide,0.25 or 0.'5MG'$ /DOS, (OZEMPIC, 0.25 OR 0.5 MG/DOSE,) 2 MG/1.5ML SOPN  ? ? ?  Return for As scheduled. ?  ?I discussed the assessment and treatment plan with the patient. The patient was provided an opportunity to ask questions and all were answered. The patient agreed with the plan and demonstrated an understanding of the instructions. ?  ?The patient was advised to call back or seek an in-person evaluation if the symptoms worsen or if the condition fails to improve as anticipated. ? ?I provided 13 minutes of non-face-to-face time during this encounter. ? ? ?Tommi Rumps, MD  ? ?

## 2021-06-27 ENCOUNTER — Ambulatory Visit
Admission: RE | Admit: 2021-06-27 | Discharge: 2021-06-27 | Disposition: A | Discharge status: Home / Self Care | Payer: Medicare Other | Source: Ambulatory Visit | Attending: Family Medicine | Admitting: Family Medicine

## 2021-06-27 DIAGNOSIS — K439 Ventral hernia without obstruction or gangrene: Secondary | ICD-10-CM | POA: Diagnosis not present

## 2021-06-27 DIAGNOSIS — R160 Hepatomegaly, not elsewhere classified: Secondary | ICD-10-CM | POA: Diagnosis not present

## 2021-06-27 DIAGNOSIS — N281 Cyst of kidney, acquired: Secondary | ICD-10-CM | POA: Diagnosis not present

## 2021-06-27 DIAGNOSIS — N2889 Other specified disorders of kidney and ureter: Secondary | ICD-10-CM | POA: Insufficient documentation

## 2021-06-27 DIAGNOSIS — K760 Fatty (change of) liver, not elsewhere classified: Secondary | ICD-10-CM | POA: Diagnosis not present

## 2021-06-27 MED ORDER — GADOBUTROL 1 MMOL/ML IV SOLN
10.0000 mL | Freq: Once | INTRAVENOUS | Status: AC | PRN
  Administered 2021-06-27: 10 mL via INTRAVENOUS

## 2021-07-01 ENCOUNTER — Encounter: Payer: Self-pay | Admitting: Family Medicine

## 2021-07-03 ENCOUNTER — Telehealth: Payer: Self-pay | Admitting: Family Medicine

## 2021-07-03 NOTE — Telephone Encounter (Signed)
Please let the patient know that I checked with urology and they noted no follow-up was needed on the cyst that was seen.  ?

## 2021-07-03 NOTE — Telephone Encounter (Signed)
I called and spoke with the patient and informed her that the provider had spoken to a urologist and no follow up is needed for the cyst and she understood.  Shelby Estes,cma  ?

## 2021-07-03 NOTE — Telephone Encounter (Signed)
-----   Message from Billey Co, MD sent at 07/03/2021  7:57 AM EDT ----- ?Regarding: RE: question about renal cysts ?No follow up needed ? ?Aaron Edelman ?----- Message ----- ?From: Leone Haven, MD ?Sent: 07/02/2021   1:53 PM EDT ?To: Billey Co, MD ?Subject: question about renal cysts                    ? ?Porfirio Mylar,  ? ?I wanted to see if you could help me with a question on this patient. She had an MRI to follow-up on a renal lesion. I have included the radiology read below: ? ?"2.9 cm exophytic cyst at the anterior upper pole right kidney demonstrates ?mildly increased T1 signal suggesting proteinaceous/hemorrhagic ?material, with no suspicious postcontrast enhancement." ? ?They did not mention any follow recommendations. Does this need a follow-up MRI/US/CT at some point given that they did not label this a simple cyst? ? ?Thanks for your help. ? ?Randall Hiss ? ? ?

## 2021-07-04 DIAGNOSIS — M1711 Unilateral primary osteoarthritis, right knee: Secondary | ICD-10-CM | POA: Diagnosis not present

## 2021-07-10 ENCOUNTER — Telehealth: Payer: Self-pay

## 2021-07-10 NOTE — Telephone Encounter (Signed)
Noted  

## 2021-07-16 ENCOUNTER — Ambulatory Visit (INDEPENDENT_AMBULATORY_CARE_PROVIDER_SITE_OTHER): Payer: Medicare Other | Admitting: *Deleted

## 2021-07-16 DIAGNOSIS — E538 Deficiency of other specified B group vitamins: Secondary | ICD-10-CM | POA: Diagnosis not present

## 2021-07-16 MED ORDER — CYANOCOBALAMIN 1000 MCG/ML IJ SOLN
1000.0000 ug | Freq: Once | INTRAMUSCULAR | Status: AC
  Administered 2021-07-16: 1000 ug via INTRAMUSCULAR

## 2021-07-16 NOTE — Progress Notes (Signed)
Patient presented for B 12 injection to right deltoid, patient voiced no concerns nor showed any signs of distress during injection. 

## 2021-07-22 DIAGNOSIS — M170 Bilateral primary osteoarthritis of knee: Secondary | ICD-10-CM | POA: Diagnosis not present

## 2021-08-02 ENCOUNTER — Ambulatory Visit (INDEPENDENT_AMBULATORY_CARE_PROVIDER_SITE_OTHER): Payer: Medicare Other | Admitting: Family Medicine

## 2021-08-02 ENCOUNTER — Telehealth: Payer: Self-pay | Admitting: Family Medicine

## 2021-08-02 ENCOUNTER — Encounter: Payer: Self-pay | Admitting: Family Medicine

## 2021-08-02 VITALS — BP 100/70 | HR 76 | Temp 98.5°F | Ht 64.0 in | Wt 237.6 lb

## 2021-08-02 DIAGNOSIS — E119 Type 2 diabetes mellitus without complications: Secondary | ICD-10-CM

## 2021-08-02 DIAGNOSIS — G4733 Obstructive sleep apnea (adult) (pediatric): Secondary | ICD-10-CM

## 2021-08-02 DIAGNOSIS — K5900 Constipation, unspecified: Secondary | ICD-10-CM

## 2021-08-02 DIAGNOSIS — K625 Hemorrhage of anus and rectum: Secondary | ICD-10-CM

## 2021-08-02 DIAGNOSIS — E538 Deficiency of other specified B group vitamins: Secondary | ICD-10-CM

## 2021-08-02 DIAGNOSIS — R0989 Other specified symptoms and signs involving the circulatory and respiratory systems: Secondary | ICD-10-CM

## 2021-08-02 DIAGNOSIS — E1169 Type 2 diabetes mellitus with other specified complication: Secondary | ICD-10-CM | POA: Diagnosis not present

## 2021-08-02 DIAGNOSIS — Z780 Asymptomatic menopausal state: Secondary | ICD-10-CM

## 2021-08-02 DIAGNOSIS — E669 Obesity, unspecified: Secondary | ICD-10-CM

## 2021-08-02 DIAGNOSIS — Z9989 Dependence on other enabling machines and devices: Secondary | ICD-10-CM

## 2021-08-02 LAB — VITAMIN B12: Vitamin B-12: 268 pg/mL (ref 211–911)

## 2021-08-02 LAB — HEMOGLOBIN A1C: Hgb A1c MFr Bld: 7.4 % — ABNORMAL HIGH (ref 4.6–6.5)

## 2021-08-02 MED ORDER — POLYETHYLENE GLYCOL 3350 17 GM/SCOOP PO POWD: 17.0000 g | Freq: Every day | ORAL | 0 refills | Status: AC | PRN

## 2021-08-02 MED ORDER — TRULICITY 1.5 MG/0.5ML ~~LOC~~ SOAJ
1.5000 mg | SUBCUTANEOUS | 1 refills | Status: DC
Start: 2021-08-30 — End: 2021-10-28

## 2021-08-02 MED ORDER — TRULICITY 0.75 MG/0.5ML ~~LOC~~ SOAJ: 0.7500 mg | SUBCUTANEOUS | 0 refills | Status: DC

## 2021-08-02 NOTE — Assessment & Plan Note (Signed)
Chronic issue.  We will switch her over to Trulicity as it has a lower incidence of constipation.  She will start with 0.75 mg weekly for 28 days and then increase to 1.5 mg weekly.  Discussed that the general side effects and risks of this medication are similar to the Ozempic.

## 2021-08-02 NOTE — Assessment & Plan Note (Signed)
Possibly poorly controlled at this time.  We will check a compliance report.  She will continue her CPAP.

## 2021-08-02 NOTE — Telephone Encounter (Signed)
Lft pt vm to call ofc . thanks 

## 2021-08-02 NOTE — Assessment & Plan Note (Signed)
Check B12 today.  She will continue B12 injections.

## 2021-08-02 NOTE — Patient Instructions (Signed)
Nice to see you. We will switch you over to Trulicity to help with your diabetes. Please try the MiraLAX daily to see if that helps with your constipation.

## 2021-08-02 NOTE — Progress Notes (Signed)
Tommi Rumps, MD Phone: [phone]  Shelby Estes is a 66 y.o. female who presents today for f/u.  Diabetes: Patient notes her sugars have been less than 160.  She is using Ozempic, metformin, and Jardiance.  Constipation: This is a chronic issue though when she started on the Ozempic this worsened.  Has been having 1 bowel movement a week.  Has had to use enemas.  She notes after one of the enemas she had some bright red blood.  She has not felt any hemorrhoids.  She tried Dulcolax with no benefit.  She has not been using anything else for this.  Her most recent colonoscopy was on 07/11/2020 with several polyps.  B12 deficiency: Patient has been getting B12 injections.  She continues to feel tired most of the time.  OSA: Patient notes hypersomnia.  She does not wake well rested.  She does use her CPAP.  This is through [place].  Social History   Tobacco Use  Smoking Status Never  Smokeless Tobacco Never    Current Outpatient Medications on File Prior to Visit  Medication Sig Dispense Refill   ADULT ASPIRIN REGIMEN 81 MG EC tablet      Cholecalciferol (VITAMIN D) 2000 UNITS CAPS Take 1 capsule by mouth daily.     doxycycline (VIBRA-TABS) 100 MG tablet Take 1 tablet (100 mg total) by mouth 2 (two) times daily. 14 tablet 0   DULoxetine (CYMBALTA) 30 MG capsule TAKE 2 CAPSULES EVERY DAY AS DIRECTED 180 capsule 1   empagliflozin (JARDIANCE) 25 MG TABS tablet Take 1 tablet (25 mg total) by mouth daily before breakfast. 90 tablet 1   fluticasone (FLONASE) 50 MCG/ACT nasal spray Place 2 sprays into both nostrils daily. 16 g 6   furosemide (LASIX) 20 MG tablet      hydrochlorothiazide (HYDRODIURIL) 12.5 MG tablet hydrochlorothiazide 12.5 mg tablet     losartan (COZAAR) 100 MG tablet losartan 100 mg tablet     meloxicam (MOBIC) 15 MG tablet Take 15 mg by mouth daily.      metFORMIN (GLUCOPHAGE) 1000 MG tablet Take 0.5 tablets (500 mg total) by mouth 2 (two) times daily with a meal. 180  tablet 1   metoprolol tartrate (LOPRESSOR) 25 MG tablet Take 1 tablet (25 mg total) by mouth 2 (two) times daily. 60 tablet 6   pantoprazole (PROTONIX) 40 MG tablet TAKE ONE TABLET BY MOUTH TWICE DAILY BEFORE MEALS 60 tablet 3   rosuvastatin (CRESTOR) 10 MG tablet Take 1 tablet (10 mg total) by mouth daily. 90 tablet 1   gabapentin (NEURONTIN) 100 MG capsule Take 1 capsule (100 mg total) by mouth 3 (three) times daily. (Patient not taking: Reported on 08/02/2021) 90 capsule 3   No current facility-administered medications on file prior to visit.     ROS see history of present illness  Objective  Physical Exam Vitals:   08/02/21 0927  BP: 100/70  Pulse: 76  Temp: 98.5 F (36.9 C)  SpO2: 99%    BP Readings from Last 3 Encounters:  08/02/21 100/70  05/03/21 120/70  03/26/21 126/88   Wt Readings from Last 3 Encounters:  08/02/21 237 lb 9.6 oz (107.8 kg)  05/03/21 236 lb (107 kg)  03/26/21 240 lb 12.8 oz (109.2 kg)    Physical Exam Constitutional:      General: She is not in acute distress.    Appearance: She is not diaphoretic.  Cardiovascular:     Rate and Rhythm: Normal rate and regular rhythm.  Heart sounds: Normal heart sounds.  Pulmonary:     Effort: Pulmonary effort is normal.     Breath sounds: Normal breath sounds.  Musculoskeletal:     Right lower leg: No edema.     Left lower leg: No edema.  Skin:    General: Skin is warm and dry.  Neurological:     Mental Status: She is alert.     Assessment/Plan: Please see individual problem list.  Problem List Items Addressed This Visit     B12 deficiency (Chronic)    Check B12 today.  She will continue B12 injections.       Relevant Orders   B12   Constipation (Chronic)    This is a chronic issue though I suspect it was worsened by the Ozempic.  We will switch the Ozempic over to Trulicity.  I will have her take MiraLAX daily until she is having good bowel movements.  Given the bleeding I did discuss  having her see GI.  I think this would be worthwhile given her family history of colon cancer.       Relevant Medications   polyethylene glycol powder (GLYCOLAX/MIRALAX) 17 GM/SCOOP powder   Diabetes mellitus type 2 in obese (HCC) (Chronic)    Chronic issue.  We will switch her over to Trulicity as it has a lower incidence of constipation.  She will start with 0.75 mg weekly for 28 days and then increase to 1.5 mg weekly.  Discussed that the general side effects and risks of this medication are similar to the Ozempic.       Relevant Medications   Dulaglutide (TRULICITY) 3.30 QT/6.2UQ SOPN   Dulaglutide (TRULICITY) 1.5 JF/3.5KT SOPN (Start on 08/30/2021)   Other Relevant Orders   HgB A1c   OSA on CPAP - Primary (Chronic)    Possibly poorly controlled at this time.  We will check a compliance report.  She will continue her CPAP.       Decreased pedal pulses    ABIs ordered.       Relevant Orders   US ARTERIAL ABI (SCREENING LOWER EXTREMITY)   Other Visit Diagnoses     Postmenopausal estrogen deficiency       Relevant Orders   DG Bone Density   BRBPR (bright red blood per rectum)       Relevant Orders   Ambulatory referral to Gastroenterology        Health Maintenance: Patient will call to schedule her bone density scan.  Return in about 3 months (around 11/02/2021).   Tommi Rumps, MD [place]

## 2021-08-02 NOTE — Assessment & Plan Note (Signed)
This is a chronic issue though I suspect it was worsened by the Ozempic.  We will switch the Ozempic over to Trulicity.  I will have her take MiraLAX daily until she is having good bowel movements.  Given the bleeding I did discuss having her see GI.  I think this would be worthwhile given her family history of colon cancer.

## 2021-08-02 NOTE — Assessment & Plan Note (Signed)
ABIs ordered. 

## 2021-08-14 ENCOUNTER — Ambulatory Visit: Payer: Medicare Other

## 2021-08-16 DIAGNOSIS — M109 Gout, unspecified: Secondary | ICD-10-CM | POA: Insufficient documentation

## 2021-08-19 NOTE — Progress Notes (Signed)
Compliance report for patient is in the lab basket for your review.  Shelby Estes,cma

## 2021-08-21 ENCOUNTER — Ambulatory Visit
Admission: RE | Admit: 2021-08-21 | Discharge: 2021-08-21 | Disposition: A | Discharge status: Home / Self Care | Payer: Medicare Other | Source: Ambulatory Visit | Attending: Family Medicine | Admitting: Family Medicine

## 2021-08-21 ENCOUNTER — Ambulatory Visit (INDEPENDENT_AMBULATORY_CARE_PROVIDER_SITE_OTHER): Payer: Medicare Other

## 2021-08-21 DIAGNOSIS — R911 Solitary pulmonary nodule: Secondary | ICD-10-CM | POA: Diagnosis not present

## 2021-08-21 DIAGNOSIS — I700 Atherosclerosis of aorta: Secondary | ICD-10-CM | POA: Diagnosis not present

## 2021-08-21 DIAGNOSIS — E538 Deficiency of other specified B group vitamins: Secondary | ICD-10-CM | POA: Diagnosis not present

## 2021-08-21 DIAGNOSIS — R0989 Other specified symptoms and signs involving the circulatory and respiratory systems: Secondary | ICD-10-CM

## 2021-08-21 MED ORDER — CYANOCOBALAMIN 1000 MCG/ML IJ SOLN
1000.0000 ug | Freq: Once | INTRAMUSCULAR | Status: AC
  Administered 2021-08-21: 1000 ug via INTRAMUSCULAR

## 2021-08-21 NOTE — Progress Notes (Signed)
Patient presented for B 12 injection to left deltoid, patient voiced no concerns nor showed any signs of distress during injection. 

## 2021-08-22 ENCOUNTER — Ambulatory Visit: Payer: Medicare Other

## 2021-08-23 ENCOUNTER — Other Ambulatory Visit: Payer: Self-pay | Admitting: Family Medicine

## 2021-08-23 ENCOUNTER — Telehealth: Payer: Self-pay | Admitting: Family Medicine

## 2021-08-23 DIAGNOSIS — R911 Solitary pulmonary nodule: Secondary | ICD-10-CM

## 2021-08-23 DIAGNOSIS — M109 Gout, unspecified: Secondary | ICD-10-CM | POA: Diagnosis not present

## 2021-08-23 NOTE — Telephone Encounter (Signed)
Please let the patient know that her CPAP is working adequately.  Her tiredness may be related to something other than her sleep apnea such as her B12 deficiency.  We can see how she does with her B12 injections and follow-up on this at her next visit.

## 2021-08-23 NOTE — Telephone Encounter (Signed)
-----   Message from Gordy Councilman, [place] sent at 08/19/2021  8:14 AM EDT -----    ----- Message ----- From: Leone Haven, MD Sent: 08/02/2021   9:50 AM EDT To: Gordy Councilman, CMA  Can you contact [facility] and see if we can get a compliance report for this patient's CPAP?  Thanks.

## 2021-08-26 DIAGNOSIS — M170 Bilateral primary osteoarthritis of knee: Secondary | ICD-10-CM | POA: Diagnosis not present

## 2021-08-27 ENCOUNTER — Other Ambulatory Visit: Payer: Self-pay | Admitting: Family Medicine

## 2021-08-27 DIAGNOSIS — K219 Gastro-esophageal reflux disease without esophagitis: Secondary | ICD-10-CM

## 2021-08-27 NOTE — Telephone Encounter (Signed)
I called and spoke with the patient and informed her that her CPAP machine is working adequately and she understood.  I also informed her that the provider stated he thinks her tiredness is from her B12 being low and they will continue to monitor it with her B12 injections and see how she is doing at her next visit. Gae Bon, cma

## 2021-09-02 DIAGNOSIS — M170 Bilateral primary osteoarthritis of knee: Secondary | ICD-10-CM | POA: Diagnosis not present

## 2021-09-03 ENCOUNTER — Other Ambulatory Visit: Payer: Self-pay | Admitting: Family Medicine

## 2021-09-03 DIAGNOSIS — E669 Obesity, unspecified: Secondary | ICD-10-CM

## 2021-09-04 ENCOUNTER — Ambulatory Visit (INDEPENDENT_AMBULATORY_CARE_PROVIDER_SITE_OTHER): Payer: Medicare Other

## 2021-09-04 DIAGNOSIS — E538 Deficiency of other specified B group vitamins: Secondary | ICD-10-CM

## 2021-09-04 MED ORDER — CYANOCOBALAMIN 1000 MCG/ML IJ SOLN
1000.0000 ug | Freq: Once | INTRAMUSCULAR | Status: AC
  Administered 2021-09-04: 1000 ug via INTRAMUSCULAR

## 2021-09-04 NOTE — Progress Notes (Signed)
Patient presented for B 12 injection to right deltoid, patient voiced no concerns nor showed any signs of distress during injection. 

## 2021-09-09 DIAGNOSIS — M170 Bilateral primary osteoarthritis of knee: Secondary | ICD-10-CM | POA: Diagnosis not present

## 2021-09-14 ENCOUNTER — Other Ambulatory Visit: Payer: Self-pay | Admitting: Family Medicine

## 2021-09-18 ENCOUNTER — Ambulatory Visit (INDEPENDENT_AMBULATORY_CARE_PROVIDER_SITE_OTHER): Payer: Medicare Other

## 2021-09-18 DIAGNOSIS — E538 Deficiency of other specified B group vitamins: Secondary | ICD-10-CM | POA: Diagnosis not present

## 2021-09-18 MED ORDER — CYANOCOBALAMIN 1000 MCG/ML IJ SOLN
1000.0000 ug | Freq: Once | INTRAMUSCULAR | Status: AC
  Administered 2021-09-18: 1000 ug via INTRAMUSCULAR

## 2021-09-18 NOTE — Progress Notes (Signed)
Patient presented for B 12 injection to right deltoid, patient voiced no concerns nor showed any signs of distress during injection. 

## 2021-09-30 ENCOUNTER — Other Ambulatory Visit: Payer: Self-pay | Admitting: Family Medicine

## 2021-09-30 DIAGNOSIS — E1169 Type 2 diabetes mellitus with other specified complication: Secondary | ICD-10-CM

## 2021-10-02 ENCOUNTER — Ambulatory Visit (INDEPENDENT_AMBULATORY_CARE_PROVIDER_SITE_OTHER): Payer: Medicare Other | Admitting: *Deleted

## 2021-10-02 DIAGNOSIS — E538 Deficiency of other specified B group vitamins: Secondary | ICD-10-CM

## 2021-10-02 MED ORDER — CYANOCOBALAMIN 1000 MCG/ML IJ SOLN
1000.0000 ug | Freq: Once | INTRAMUSCULAR | Status: AC
  Administered 2021-10-02: 1000 ug via INTRAMUSCULAR

## 2021-10-02 NOTE — Progress Notes (Signed)
Pt  received B12 injection in left deltoid. Pt tolerated it well with no concerns or complaints. 

## 2021-10-16 ENCOUNTER — Ambulatory Visit (INDEPENDENT_AMBULATORY_CARE_PROVIDER_SITE_OTHER): Payer: Medicare Other

## 2021-10-16 DIAGNOSIS — E538 Deficiency of other specified B group vitamins: Secondary | ICD-10-CM

## 2021-10-16 MED ORDER — CYANOCOBALAMIN 1000 MCG/ML IJ SOLN
1000.0000 ug | Freq: Once | INTRAMUSCULAR | Status: AC
  Administered 2021-10-16: 1000 ug via INTRAMUSCULAR

## 2021-10-16 NOTE — Progress Notes (Signed)
Patient presented for B 12 injection to left deltoid, patient voiced no concerns nor showed any signs of distress during injection. 

## 2021-10-25 ENCOUNTER — Other Ambulatory Visit: Payer: Self-pay | Admitting: Family Medicine

## 2021-10-28 ENCOUNTER — Encounter: Payer: Self-pay | Admitting: Family Medicine

## 2021-10-28 DIAGNOSIS — E1169 Type 2 diabetes mellitus with other specified complication: Secondary | ICD-10-CM

## 2021-10-28 MED ORDER — TRULICITY 1.5 MG/0.5ML ~~LOC~~ SOAJ: 1.5000 mg | SUBCUTANEOUS | 1 refills | Status: DC

## 2021-10-30 ENCOUNTER — Other Ambulatory Visit: Payer: Self-pay | Admitting: Family Medicine

## 2021-10-30 ENCOUNTER — Ambulatory Visit (INDEPENDENT_AMBULATORY_CARE_PROVIDER_SITE_OTHER): Payer: Medicare Other

## 2021-10-30 DIAGNOSIS — E538 Deficiency of other specified B group vitamins: Secondary | ICD-10-CM | POA: Diagnosis not present

## 2021-10-30 MED ORDER — CYANOCOBALAMIN 1000 MCG/ML IJ SOLN
1000.0000 ug | Freq: Once | INTRAMUSCULAR | Status: AC
  Administered 2021-10-30: 1000 ug via INTRAMUSCULAR

## 2021-10-30 NOTE — Progress Notes (Signed)
Patient presented for B 12 injection to left deltoid, patient voiced no concerns nor showed any signs of distress during injection. 

## 2021-11-01 ENCOUNTER — Ambulatory Visit
Admission: RE | Admit: 2021-11-01 | Discharge: 2021-11-01 | Disposition: A | Discharge status: Home / Self Care | Payer: Medicare Other | Source: Ambulatory Visit | Attending: Family Medicine | Admitting: Family Medicine

## 2021-11-01 DIAGNOSIS — Z780 Asymptomatic menopausal state: Secondary | ICD-10-CM | POA: Diagnosis not present

## 2021-11-01 DIAGNOSIS — M85852 Other specified disorders of bone density and structure, left thigh: Secondary | ICD-10-CM | POA: Diagnosis not present

## 2021-11-06 ENCOUNTER — Telehealth: Payer: Self-pay | Admitting: Family Medicine

## 2021-11-06 ENCOUNTER — Encounter: Payer: Self-pay | Admitting: Family Medicine

## 2021-11-06 ENCOUNTER — Ambulatory Visit (INDEPENDENT_AMBULATORY_CARE_PROVIDER_SITE_OTHER): Payer: Medicare Other | Admitting: Family Medicine

## 2021-11-06 VITALS — BP 120/70 | HR 76 | Temp 98.2°F | Ht 64.0 in | Wt 238.2 lb

## 2021-11-06 DIAGNOSIS — E1169 Type 2 diabetes mellitus with other specified complication: Secondary | ICD-10-CM

## 2021-11-06 DIAGNOSIS — E538 Deficiency of other specified B group vitamins: Secondary | ICD-10-CM | POA: Diagnosis not present

## 2021-11-06 DIAGNOSIS — M10279 Drug-induced gout, unspecified ankle and foot: Secondary | ICD-10-CM

## 2021-11-06 DIAGNOSIS — E669 Obesity, unspecified: Secondary | ICD-10-CM | POA: Diagnosis not present

## 2021-11-06 DIAGNOSIS — Z23 Encounter for immunization: Secondary | ICD-10-CM

## 2021-11-06 DIAGNOSIS — I10 Essential (primary) hypertension: Secondary | ICD-10-CM

## 2021-11-06 DIAGNOSIS — R911 Solitary pulmonary nodule: Secondary | ICD-10-CM

## 2021-11-06 LAB — MICROALBUMIN / CREATININE URINE RATIO
Creatinine,U: 98.4 mg/dL
Microalb Creat Ratio: 0.7 mg/g (ref 0.0–30.0)
Microalb, Ur: <0.7 mg/dL (ref 0.0–1.9)

## 2021-11-06 LAB — HEMOGLOBIN A1C: Hgb A1c MFr Bld: 7.1 % — ABNORMAL HIGH (ref 4.6–6.5)

## 2021-11-06 LAB — URIC ACID: Uric Acid, Serum: 7.1 mg/dL — ABNORMAL HIGH (ref 2.4–7.0)

## 2021-11-06 LAB — VITAMIN B12: Vitamin B-12: 626 pg/mL (ref 211–911)

## 2021-11-06 MED ORDER — AMLODIPINE BESYLATE 5 MG PO TABS: 5.0000 mg | ORAL_TABLET | Freq: Every day | ORAL | 2 refills | Status: DC

## 2021-11-06 MED ORDER — TETANUS-DIPHTHERIA TOXOIDS TD 5-2 LFU IM INJ: 0.5000 mL | INJECTION | Freq: Once | INTRAMUSCULAR | 0 refills | Status: AC

## 2021-11-06 NOTE — Assessment & Plan Note (Signed)
Check B12.  Continue B12 injections every 2 weeks.

## 2021-11-06 NOTE — Assessment & Plan Note (Signed)
Check uric acid.  She can use colchicine as prescribed by orthopedics.  We will see if stopping HCTZ makes a difference for future gout flares.

## 2021-11-06 NOTE — Assessment & Plan Note (Signed)
Adequately controlled though she is having issues with gout and thus we will have her stop HCTZ.  We will start her on amlodipine 5 mg once daily.  Discussed potential side effects of swelling and constipation.  If she notices though she will let us know.  She will continue losartan 100 mg daily and metoprolol 25 mg twice daily.

## 2021-11-06 NOTE — Progress Notes (Signed)
Tommi Rumps, MD Phone: [phone]  Shelby Estes is a 66 y.o. female who presents today for f/u.  HYPERTENSION Disease Monitoring Home BP Monitoring 120/75 Chest pain- no    Dyspnea- no Medications Compliance-  taking HCTZ, losartan, metoprolol.  Edema- no BMET    Component Value Date/Time   NA 142 05/16/2021 1434   NA 144 01/31/2020 0832   NA 140 09/22/2013 1523   K 3.9 05/16/2021 1434   K 3.8 09/22/2013 1523   CL 101 05/16/2021 1434   CL 104 09/22/2013 1523   CO2 29 05/16/2021 1434   CO2 28 09/22/2013 1523   GLUCOSE 111 (H) 05/16/2021 1434   GLUCOSE 105 (H) 09/22/2013 1523   BUN 28 (H) 05/16/2021 1434   BUN 11 01/31/2020 0832   BUN 10 09/22/2013 1523   CREATININE 1.22 (H) 05/16/2021 1434   CREATININE 0.87 09/22/2013 1523   CALCIUM 9.7 05/16/2021 1434   CALCIUM 9.8 09/22/2013 1523   GFRNONAA 57 (L) 01/31/2020 0832   GFRNONAA >60 09/22/2013 1523   GFRAA 66 01/31/2020 0832   GFRAA >60 09/22/2013 1523   DIABETES Disease Monitoring: Blood Sugar ranges-improved to 110s in the afternoon, typically higher in the morning 150s Polyuria/phagia/dipsia- no      Optho- UTD Medications: Compliance- taking metformin, jardiance, trulicity Hypoglycemic symptoms- no  Exercise is limited related to bone-on-bone knee issues.  She is deciding if she wants to do knee replacement.  She has cut out soda for the most part.  She is drinking unsweetened tea.  She is cutting down on bread.  Gout: Patient recently treated for gout in [facility].  She has been working on limiting red meat.  She notes she was initially treated with prednisone and they also send in colchicine for her to have on hand.   Social History   Tobacco Use  Smoking Status Never  Smokeless Tobacco Never    Current Outpatient Medications on File Prior to Visit  Medication Sig Dispense Refill   ADULT ASPIRIN REGIMEN 81 MG EC tablet      Cholecalciferol (VITAMIN D) 2000 UNITS CAPS Take 1 capsule by mouth  daily.     colchicine 0.6 MG tablet Take 0.6 mg by mouth 2 (two) times daily.     doxycycline (VIBRA-TABS) 100 MG tablet Take 1 tablet (100 mg total) by mouth 2 (two) times daily. 14 tablet 0   Dulaglutide (TRULICITY) 1.5 SJ/6.2EZ SOPN Inject 1.5 mg into the skin once a week. 6 mL 1   DULoxetine (CYMBALTA) 30 MG capsule TAKE 2 CAPSULES BY MOUTH EVERY DAY AS DIRECTED 180 capsule 1   empagliflozin (JARDIANCE) 25 MG TABS tablet Take 1 tablet (25 mg total) by mouth daily before breakfast. 90 tablet 1   fluticasone (FLONASE) 50 MCG/ACT nasal spray Place 2 sprays into both nostrils daily. 16 g 6   furosemide (LASIX) 20 MG tablet      losartan (COZAAR) 100 MG tablet losartan 100 mg tablet     meloxicam (MOBIC) 15 MG tablet Take 15 mg by mouth daily.      metFORMIN (GLUCOPHAGE) 1000 MG tablet Take 0.5 tablets (500 mg total) by mouth 2 (two) times daily with a meal. 180 tablet 1   metoprolol tartrate (LOPRESSOR) 25 MG tablet Take 1 tablet (25 mg total) by mouth 2 (two) times daily. 60 tablet 6   pantoprazole (PROTONIX) 40 MG tablet TAKE ONE TABLET BY MOUTH TWICE DAILY BEFORE MEALS 60 tablet 3   polyethylene glycol powder (GLYCOLAX/MIRALAX) 17 GM/SCOOP  powder Take 17 g by mouth daily as needed for mild constipation. 500 g 0   rosuvastatin (CRESTOR) 10 MG tablet TAKE ONE TABLET BY MOUTH EVERY DAY 90 tablet 1   No current facility-administered medications on file prior to visit.     ROS see history of present illness  Objective  Physical Exam Vitals:   11/06/21 1124  BP: 120/70  Pulse: 76  Temp: 98.2 F (36.8 C)  SpO2: 99%    BP Readings from Last 3 Encounters:  11/06/21 120/70  08/02/21 100/70  05/03/21 120/70   Wt Readings from Last 3 Encounters:  11/06/21 238 lb 3.2 oz (108 kg)  08/02/21 237 lb 9.6 oz (107.8 kg)  05/03/21 236 lb (107 kg)    Physical Exam Constitutional:      General: She is not in acute distress.    Appearance: She is not diaphoretic.  Cardiovascular:      Rate and Rhythm: Normal rate and regular rhythm.     Heart sounds: Normal heart sounds.  Pulmonary:     Effort: Pulmonary effort is normal.     Breath sounds: Normal breath sounds.  Skin:    General: Skin is warm and dry.  Neurological:     Mental Status: She is alert.      Assessment/Plan: Please see individual problem list.  Problem List Items Addressed This Visit     B12 deficiency (Chronic)    Check B12.  Continue B12 injections every 2 weeks.      Relevant Orders   B12   Diabetes mellitus type 2 in obese (HCC) (Chronic)    Check A1c.  She will continue Trulicity 1.5 mg weekly, Jardiance 25 mg daily, and metformin 500 mg twice daily.  She will continue with diet changes.      Relevant Orders   HgB A1c   Urine Microalbumin w/creat. ratio   Hypertension - Primary (Chronic)    Adequately controlled though she is having issues with gout and thus we will have her stop HCTZ.  We will start her on amlodipine 5 mg once daily.  Discussed potential side effects of swelling and constipation.  If she notices though she will let us know.  She will continue losartan 100 mg daily and metoprolol 25 mg twice daily.      Relevant Medications   amLODipine (NORVASC) 5 MG tablet   Gout    Check uric acid.  She can use colchicine as prescribed by orthopedics.  We will see if stopping HCTZ makes a difference for future gout flares.      Relevant Orders   Uric acid   Lung nodule    Needs follow-up CT chest in December 2023 through June 2024.      Other Visit Diagnoses     Need for pneumococcal vaccination       Relevant Orders   Pneumococcal conjugate vaccine 20-valent (Prevnar 20) (Completed)        Health Maintenance: Patient will get the Shingrix vaccine at the pharmacy.  Tetanus vaccine sent to the pharmacy as well.  Prevnar 20 given today.  Return in about 3 months (around 02/06/2022) for DM, weight.   Tommi Rumps, MD [place]

## 2021-11-06 NOTE — Telephone Encounter (Signed)
Shelby Estes from [place] needs clarification on injection that was ordered. Please call her at [phone].

## 2021-11-06 NOTE — Patient Instructions (Addendum)
Nice to see you. Please get the Shingrix vaccine and tetanus vaccine at the pharmacy. We will contact you with your lab results. If you notice swelling or constipation issues with the amlodipine please let me know.  Please stop your HCTZ.

## 2021-11-06 NOTE — Assessment & Plan Note (Signed)
Check A1c.  She will continue Trulicity 1.5 mg weekly, Jardiance 25 mg daily, and metformin 500 mg twice daily.  She will continue with diet changes.

## 2021-11-06 NOTE — Assessment & Plan Note (Signed)
Needs follow-up CT chest in December 2023 through June 2024.

## 2021-11-07 NOTE — Telephone Encounter (Signed)
[facility] and they stated they only had tdap and wanted to know it it was okay to give that to the patient, in looking in the patients chart I saw no reason for the patient to not take the tdap.  I informed her it was okay.  Lynell Greenhouse,cma

## 2021-11-13 ENCOUNTER — Ambulatory Visit (INDEPENDENT_AMBULATORY_CARE_PROVIDER_SITE_OTHER): Payer: Medicare Other

## 2021-11-13 DIAGNOSIS — E538 Deficiency of other specified B group vitamins: Secondary | ICD-10-CM

## 2021-11-13 MED ORDER — CYANOCOBALAMIN 1000 MCG/ML IJ SOLN
1000.0000 ug | Freq: Once | INTRAMUSCULAR | Status: AC
  Administered 2021-11-13: 1000 ug via INTRAMUSCULAR

## 2021-11-13 NOTE — Progress Notes (Signed)
Patient presented for B 12 injection to right deltoid, patient voiced no concerns nor showed any signs of distress during injection. 

## 2021-11-14 DIAGNOSIS — E119 Type 2 diabetes mellitus without complications: Secondary | ICD-10-CM | POA: Diagnosis not present

## 2021-11-14 DIAGNOSIS — I10 Essential (primary) hypertension: Secondary | ICD-10-CM | POA: Diagnosis not present

## 2021-11-14 DIAGNOSIS — I340 Nonrheumatic mitral (valve) insufficiency: Secondary | ICD-10-CM | POA: Diagnosis not present

## 2021-11-14 DIAGNOSIS — E785 Hyperlipidemia, unspecified: Secondary | ICD-10-CM | POA: Diagnosis not present

## 2021-11-27 ENCOUNTER — Ambulatory Visit: Payer: Medicare Other

## 2021-11-29 ENCOUNTER — Ambulatory Visit (INDEPENDENT_AMBULATORY_CARE_PROVIDER_SITE_OTHER): Payer: Medicare Other

## 2021-11-29 DIAGNOSIS — E538 Deficiency of other specified B group vitamins: Secondary | ICD-10-CM

## 2021-11-29 MED ORDER — CYANOCOBALAMIN 1000 MCG/ML IJ SOLN
1000.0000 ug | Freq: Once | INTRAMUSCULAR | Status: AC
  Administered 2021-11-29: 1000 ug via INTRAMUSCULAR

## 2021-11-29 NOTE — Progress Notes (Signed)
Pt arrived for B12 injection, given in R deltoid. Pt tolerated injection well, showed no signs of distress nor voiced any concerns.  

## 2021-12-13 ENCOUNTER — Ambulatory Visit (INDEPENDENT_AMBULATORY_CARE_PROVIDER_SITE_OTHER): Payer: Medicare Other

## 2021-12-13 DIAGNOSIS — E538 Deficiency of other specified B group vitamins: Secondary | ICD-10-CM

## 2021-12-13 MED ORDER — CYANOCOBALAMIN 1000 MCG/ML IJ SOLN
1000.0000 ug | Freq: Once | INTRAMUSCULAR | Status: AC
  Administered 2021-12-13: 1000 ug via INTRAMUSCULAR

## 2021-12-13 NOTE — Progress Notes (Signed)
Pt presented for her B12 injection. Pt was identified through two identifiers. Pt tolerated shot well in the left deltoid.

## 2021-12-14 ENCOUNTER — Other Ambulatory Visit: Payer: Self-pay | Admitting: Family

## 2021-12-14 ENCOUNTER — Other Ambulatory Visit: Payer: Self-pay | Admitting: Family Medicine

## 2021-12-18 ENCOUNTER — Ambulatory Visit: Payer: Medicare Other | Admitting: Gastroenterology

## 2021-12-23 ENCOUNTER — Encounter: Payer: Self-pay | Admitting: Family Medicine

## 2021-12-23 DIAGNOSIS — E669 Obesity, unspecified: Secondary | ICD-10-CM

## 2021-12-26 MED ORDER — TIRZEPATIDE 2.5 MG/0.5ML ~~LOC~~ SOAJ: 2.5000 mg | SUBCUTANEOUS | 0 refills | Status: AC

## 2021-12-26 MED ORDER — TIRZEPATIDE 5 MG/0.5ML ~~LOC~~ SOAJ: 5.0000 mg | SUBCUTANEOUS | 1 refills | Status: DC

## 2021-12-27 ENCOUNTER — Ambulatory Visit (INDEPENDENT_AMBULATORY_CARE_PROVIDER_SITE_OTHER): Payer: Medicare Other

## 2021-12-27 ENCOUNTER — Telehealth: Payer: Self-pay

## 2021-12-27 DIAGNOSIS — E538 Deficiency of other specified B group vitamins: Secondary | ICD-10-CM | POA: Diagnosis not present

## 2021-12-27 MED ORDER — CYANOCOBALAMIN 1000 MCG/ML IJ SOLN
1000.0000 ug | Freq: Once | INTRAMUSCULAR | Status: AC
  Administered 2021-12-27: 1000 ug via INTRAMUSCULAR

## 2021-12-27 NOTE — Progress Notes (Signed)
Patient arrived for B12 injection. Given B12 injection in right arm. Patient tolerated B12 injection well. Patient did not show any signs of distress or voice any concerns.

## 2021-12-27 NOTE — Telephone Encounter (Signed)
A PA was done on [facility] and It got approved form 12/26/2021-12/27/2022. Paityn Balsam,cma

## 2022-01-08 DIAGNOSIS — M170 Bilateral primary osteoarthritis of knee: Secondary | ICD-10-CM | POA: Diagnosis not present

## 2022-01-09 DIAGNOSIS — G4733 Obstructive sleep apnea (adult) (pediatric): Secondary | ICD-10-CM | POA: Diagnosis not present

## 2022-01-14 ENCOUNTER — Ambulatory Visit (INDEPENDENT_AMBULATORY_CARE_PROVIDER_SITE_OTHER): Payer: Medicare Other

## 2022-01-14 DIAGNOSIS — E538 Deficiency of other specified B group vitamins: Secondary | ICD-10-CM | POA: Diagnosis not present

## 2022-01-14 MED ORDER — CYANOCOBALAMIN 1000 MCG/ML IJ SOLN
1000.0000 ug | Freq: Once | INTRAMUSCULAR | Status: AC
  Administered 2022-01-14: 1000 ug via INTRAMUSCULAR

## 2022-01-14 NOTE — Progress Notes (Signed)
Pt arrived for B12 injection, given in R deltoid. Pt tolerated injection well, showed no signs of distress nor voiced any concerns.  

## 2022-02-03 ENCOUNTER — Ambulatory Visit: Payer: Medicare Other

## 2022-02-17 ENCOUNTER — Ambulatory Visit (INDEPENDENT_AMBULATORY_CARE_PROVIDER_SITE_OTHER): Payer: Medicare Other | Admitting: Family Medicine

## 2022-02-17 ENCOUNTER — Other Ambulatory Visit: Payer: Self-pay

## 2022-02-17 ENCOUNTER — Encounter: Payer: Self-pay | Admitting: Family Medicine

## 2022-02-17 VITALS — BP 112/72 | HR 70 | Temp 97.6°F | Ht 64.0 in | Wt 231.6 lb

## 2022-02-17 DIAGNOSIS — E538 Deficiency of other specified B group vitamins: Secondary | ICD-10-CM

## 2022-02-17 DIAGNOSIS — N3281 Overactive bladder: Secondary | ICD-10-CM | POA: Diagnosis not present

## 2022-02-17 DIAGNOSIS — E669 Obesity, unspecified: Secondary | ICD-10-CM | POA: Diagnosis not present

## 2022-02-17 DIAGNOSIS — D72829 Elevated white blood cell count, unspecified: Secondary | ICD-10-CM | POA: Diagnosis not present

## 2022-02-17 DIAGNOSIS — E1169 Type 2 diabetes mellitus with other specified complication: Secondary | ICD-10-CM | POA: Diagnosis not present

## 2022-02-17 DIAGNOSIS — I10 Essential (primary) hypertension: Secondary | ICD-10-CM

## 2022-02-17 DIAGNOSIS — R911 Solitary pulmonary nodule: Secondary | ICD-10-CM

## 2022-02-17 LAB — POCT URINALYSIS DIPSTICK
Bilirubin, UA: NEGATIVE
Blood, UA: NEGATIVE
Glucose, UA: POSITIVE — AB
Ketones, UA: NEGATIVE
Leukocytes, UA: NEGATIVE
Nitrite, UA: NEGATIVE
Protein, UA: NEGATIVE
Spec Grav, UA: 1.015 (ref 1.010–1.025)
Urobilinogen, UA: 0.2 E.U./dL
pH, UA: 5 (ref 5.0–8.0)

## 2022-02-17 LAB — CBC WITH DIFFERENTIAL/PLATELET
Basophils Absolute: 0 10*3/uL (ref 0.0–0.1)
Basophils Relative: 0.2 % (ref 0.0–3.0)
Eosinophils Absolute: 0.2 10*3/uL (ref 0.0–0.7)
Eosinophils Relative: 1.5 % (ref 0.0–5.0)
HCT: 38.5 % (ref 36.0–46.0)
Hemoglobin: 12.6 g/dL (ref 12.0–15.0)
Lymphocytes Relative: 18.1 % (ref 12.0–46.0)
Lymphs Abs: 2.2 10*3/uL (ref 0.7–4.0)
MCHC: 32.6 g/dL (ref 30.0–36.0)
MCV: 85.4 fl (ref 78.0–100.0)
Monocytes Absolute: 0.7 10*3/uL (ref 0.1–1.0)
Monocytes Relative: 5.5 % (ref 3.0–12.0)
Neutro Abs: 8.9 10*3/uL — ABNORMAL HIGH (ref 1.4–7.7)
Neutrophils Relative %: 74.7 % (ref 43.0–77.0)
Platelets: 499 10*3/uL — ABNORMAL HIGH (ref 150.0–400.0)
RBC: 4.52 Mil/uL (ref 3.87–5.11)
RDW: 15.4 % (ref 11.5–15.5)
WBC: 12 10*3/uL — ABNORMAL HIGH (ref 4.0–10.5)

## 2022-02-17 LAB — HEMOGLOBIN A1C: Hgb A1c MFr Bld: 6.8 % — ABNORMAL HIGH (ref 4.6–6.5)

## 2022-02-17 MED ORDER — CYANOCOBALAMIN 1000 MCG/ML IJ SOLN
1000.0000 ug | Freq: Once | INTRAMUSCULAR | Status: AC
  Administered 2022-02-17: 1000 ug via INTRAMUSCULAR

## 2022-02-17 MED ORDER — TIRZEPATIDE 7.5 MG/0.5ML ~~LOC~~ SOAJ: 7.5000 mg | SUBCUTANEOUS | 3 refills | Status: DC

## 2022-02-17 NOTE — Progress Notes (Signed)
Tommi Rumps, MD Phone: [phone]  Shelby Estes is a 66 y.o. female who presents today for f/u.  HYPERTENSION Disease Monitoring Home BP Monitoring 110-125/75-85 Chest pain- no    Dyspnea- no Medications Compliance-  taking amlodipine, losartan, metoprolol  Edema- no BMET    Component Value Date/Time   NA 142 05/16/2021 1434   NA 144 01/31/2020 0832   NA 140 09/22/2013 1523   K 3.9 05/16/2021 1434   K 3.8 09/22/2013 1523   CL 101 05/16/2021 1434   CL 104 09/22/2013 1523   CO2 29 05/16/2021 1434   CO2 28 09/22/2013 1523   GLUCOSE 111 (H) 05/16/2021 1434   GLUCOSE 105 (H) 09/22/2013 1523   BUN 28 (H) 05/16/2021 1434   BUN 11 01/31/2020 0832   BUN 10 09/22/2013 1523   CREATININE 1.22 (H) 05/16/2021 1434   CREATININE 0.87 09/22/2013 1523   CALCIUM 9.7 05/16/2021 1434   CALCIUM 9.8 09/22/2013 1523   GFRNONAA 57 (L) 01/31/2020 0832   GFRNONAA >60 09/22/2013 1523   GFRAA 66 01/31/2020 0832   GFRAA >60 09/22/2013 1523   DIABETES Disease Monitoring: Blood Sugar ranges-102-145 fasting, 112 in the afternoon Polyuria/phagia/dipsia- no      Optho- UTD Medications: Compliance- taking mounjaro, metformin, jardiance  Obesity: Patient notes she is not eating as much since going on the [place].  She is limiting her sweets.  She does walk some for exercise though chronic knee issues limit this.  Urge incontinence: Patient notes this has been going on intermittently for a year.  If she does not get to the bathroom quickly enough she will have some leakage and she cannot stop the leakage.  She does wear a pad.  She has started to work on timed voiding.   Social History   Tobacco Use  Smoking Status Never  Smokeless Tobacco Never    Current Outpatient Medications on File Prior to Visit  Medication Sig Dispense Refill   ADULT ASPIRIN REGIMEN 81 MG EC tablet      amLODipine (NORVASC) 5 MG tablet Take 1 tablet (5 mg total) by mouth daily. 90 tablet 2   Cholecalciferol  (VITAMIN D) 2000 UNITS CAPS Take 1 capsule by mouth daily.     colchicine 0.6 MG tablet Take 0.6 mg by mouth 2 (two) times daily.     doxycycline (VIBRA-TABS) 100 MG tablet Take 1 tablet (100 mg total) by mouth 2 (two) times daily. 14 tablet 0   DULoxetine (CYMBALTA) 30 MG capsule TAKE 2 CAPSULES BY MOUTH EVERY DAY AS DIRECTED 180 capsule 1   fluticasone (FLONASE) 50 MCG/ACT nasal spray Place 2 sprays into both nostrils daily. 16 g 6   furosemide (LASIX) 20 MG tablet      JARDIANCE 25 MG TABS tablet TAKE ONE TABLET BY MOUTH EVERY DAY BEFORE BREAKFAST 90 tablet 1   losartan (COZAAR) 100 MG tablet losartan 100 mg tablet     meloxicam (MOBIC) 15 MG tablet Take 15 mg by mouth daily.      metFORMIN (GLUCOPHAGE) 1000 MG tablet Take 0.5 tablets (500 mg total) by mouth 2 (two) times daily with a meal. 180 tablet 1   metoprolol tartrate (LOPRESSOR) 25 MG tablet Take 1 tablet (25 mg total) by mouth 2 (two) times daily. 60 tablet 6   pantoprazole (PROTONIX) 40 MG tablet TAKE ONE TABLET BY MOUTH TWICE DAILY BEFORE MEALS 60 tablet 3   polyethylene glycol powder (GLYCOLAX/MIRALAX) 17 GM/SCOOP powder Take 17 g by mouth daily as needed  for mild constipation. 500 g 0   rosuvastatin (CRESTOR) 10 MG tablet TAKE ONE TABLET BY MOUTH EVERY DAY 90 tablet 1   No current facility-administered medications on file prior to visit.     ROS see history of present illness  Objective  Physical Exam Vitals:   02/17/22 0853  BP: 112/72  Pulse: 70  Temp: 97.6 F (36.4 C)  SpO2: 98%    BP Readings from Last 3 Encounters:  02/17/22 112/72  11/06/21 120/70  08/02/21 100/70   Wt Readings from Last 3 Encounters:  02/17/22 231 lb 9.6 oz (105.1 kg)  11/06/21 238 lb 3.2 oz (108 kg)  08/02/21 237 lb 9.6 oz (107.8 kg)    Physical Exam Constitutional:      General: She is not in acute distress.    Appearance: She is not diaphoretic.  Cardiovascular:     Rate and Rhythm: Normal rate and regular rhythm.     Heart  sounds: Normal heart sounds.  Pulmonary:     Effort: Pulmonary effort is normal.     Breath sounds: Normal breath sounds.  Skin:    General: Skin is warm and dry.  Neurological:     Mental Status: She is alert.      Assessment/Plan: Please see individual problem list.  Problem List Items Addressed This Visit     B12 deficiency (Chronic)    Continue B12 injections every 2 weeks.      Diabetes mellitus type 2 in obese (HCC) (Chronic)    Check A1c.  Continue metformin 500 mg twice daily, Jardiance 25 mg daily, and we will increase Mounjaro to 7.5 mg weekly.      Relevant Medications   tirzepatide (MOUNJARO) 7.5 MG/0.5ML Pen   Other Relevant Orders   HgB A1c   Hypertension - Primary (Chronic)    Adequately controlled.  She will continue metoprolol 25 mg twice daily, losartan 100 mg daily, and amlodipine 5 mg daily.      Lung nodule (Chronic)    Recheck CT imaging.      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  Discussed trying to maintain her physical activity level.  Will increase Mounjaro to 7.5 mg weekly to see if that helps with weight loss.      Relevant Medications   tirzepatide (MOUNJARO) 7.5 MG/0.5ML Pen   Overactive bladder (Chronic)    Patient symptoms are likely related to overactive bladder.  Discussed the option of trying timed voiding versus adding medication.  Patient opted for timed voiding.  She will try to urinate every 2 hours or more frequently.  If she would like medicine in the future she will let me know.      Relevant Orders   POCT Urinalysis Dipstick   Leukocytosis    Recheck CBC with diff.       Relevant Orders   CBC w/Diff    Return in about 3 months (around 05/19/2022) for DM/weight.   Tommi Rumps, MD [place]

## 2022-02-17 NOTE — Assessment & Plan Note (Signed)
I encouraged continued healthy diet.  Discussed trying to maintain her physical activity level.  Will increase Mounjaro to 7.5 mg weekly to see if that helps with weight loss.

## 2022-02-17 NOTE — Assessment & Plan Note (Signed)
Check A1c.  Continue metformin 500 mg twice daily, Jardiance 25 mg daily, and we will increase Mounjaro to 7.5 mg weekly.

## 2022-02-17 NOTE — Assessment & Plan Note (Signed)
Recheck CBC with diff.

## 2022-02-17 NOTE — Assessment & Plan Note (Signed)
Continue B12 injections every 2 weeks

## 2022-02-17 NOTE — Assessment & Plan Note (Signed)
Patient symptoms are likely related to overactive bladder.  Discussed the option of trying timed voiding versus adding medication.  Patient opted for timed voiding.  She will try to urinate every 2 hours or more frequently.  If she would like medicine in the future she will let me know.

## 2022-02-17 NOTE — Assessment & Plan Note (Signed)
Adequately controlled.  She will continue metoprolol 25 mg twice daily, losartan 100 mg daily, and amlodipine 5 mg daily.

## 2022-02-17 NOTE — Assessment & Plan Note (Signed)
Recheck CT imaging.

## 2022-02-28 ENCOUNTER — Ambulatory Visit
Admission: RE | Admit: 2022-02-28 | Discharge: 2022-02-28 | Disposition: A | Discharge status: Home / Self Care | Payer: Medicare Other | Source: Ambulatory Visit | Attending: Family Medicine | Admitting: Family Medicine

## 2022-02-28 DIAGNOSIS — R918 Other nonspecific abnormal finding of lung field: Secondary | ICD-10-CM | POA: Diagnosis not present

## 2022-02-28 DIAGNOSIS — R911 Solitary pulmonary nodule: Secondary | ICD-10-CM | POA: Diagnosis not present

## 2022-03-03 ENCOUNTER — Other Ambulatory Visit: Payer: Self-pay

## 2022-03-04 ENCOUNTER — Ambulatory Visit (INDEPENDENT_AMBULATORY_CARE_PROVIDER_SITE_OTHER): Payer: Medicare Other

## 2022-03-04 DIAGNOSIS — E538 Deficiency of other specified B group vitamins: Secondary | ICD-10-CM

## 2022-03-04 MED ORDER — CYANOCOBALAMIN 1000 MCG/ML IJ SOLN
1000.0000 ug | Freq: Once | INTRAMUSCULAR | Status: AC
  Administered 2022-03-04: 1000 ug via INTRAMUSCULAR

## 2022-03-04 NOTE — Progress Notes (Signed)
Pt presented for their vitamin B12 injection. Pt was identified through two identifiers. Pt tolerated shot well in their left  deltoid.  

## 2022-03-20 ENCOUNTER — Ambulatory Visit (INDEPENDENT_AMBULATORY_CARE_PROVIDER_SITE_OTHER): Payer: Medicare HMO

## 2022-03-20 ENCOUNTER — Other Ambulatory Visit: Payer: Medicare Other

## 2022-03-20 DIAGNOSIS — E538 Deficiency of other specified B group vitamins: Secondary | ICD-10-CM | POA: Diagnosis not present

## 2022-03-20 MED ORDER — CYANOCOBALAMIN 1000 MCG/ML IJ SOLN
1000.0000 ug | Freq: Once | INTRAMUSCULAR | Status: AC
  Administered 2022-03-20 (×2): 1000 ug via INTRAMUSCULAR

## 2022-03-20 NOTE — Progress Notes (Signed)
Patient was administered a B12 injection in her Right Arm. Patient tolerated injection well. Patient did not show signs of distress or voice any concerns.

## 2022-03-24 ENCOUNTER — Ambulatory Visit (INDEPENDENT_AMBULATORY_CARE_PROVIDER_SITE_OTHER): Payer: Medicare HMO

## 2022-03-24 VITALS — Ht 64.0 in | Wt 231.0 lb

## 2022-03-24 DIAGNOSIS — Z0000 Encounter for general adult medical examination without abnormal findings: Secondary | ICD-10-CM | POA: Diagnosis not present

## 2022-03-24 NOTE — Progress Notes (Signed)
Subjective:   Shelby Estes is a 67 y.o. female who presents for an Initial Medicare Annual Wellness Visit.  Review of Systems    No ROS.  Medicare Wellness Virtual Visit.  Visual/audio telehealth visit, UTA vital signs.   See social history for additional risk factors.   Cardiac Risk Factors include: advanced age (>95mn, >>36women);hypertension;diabetes mellitus     Objective:    Today's Vitals   03/24/22 0909  Weight: 231 lb (104.8 kg)  Height: '5\' 4"'$  (1.626 m)   Body mass index is 39.65 kg/m.     03/24/2022    9:19 AM 10/18/2018    9:57 AM 04/19/2018   10:08 AM 09/15/2017    2:54 PM 09/01/2017    3:39 PM 08/09/2015    8:02 AM 06/20/2015    7:12 AM  Advanced Directives  Does Patient Have a Medical Advance Directive? No No No No No No No  Would patient like information on creating a medical advance directive? No - Patient declined No - Patient declined No - Patient declined   No - patient declined information     Current Medications (verified) Outpatient Encounter Medications as of 03/24/2022  Medication Sig   ADULT ASPIRIN REGIMEN 81 MG EC tablet    amLODipine (NORVASC) 5 MG tablet Take 1 tablet (5 mg total) by mouth daily.   Cholecalciferol (VITAMIN D) 2000 UNITS CAPS Take 1 capsule by mouth daily.   colchicine 0.6 MG tablet Take 0.6 mg by mouth 2 (two) times daily.   doxycycline (VIBRA-TABS) 100 MG tablet Take 1 tablet (100 mg total) by mouth 2 (two) times daily.   DULoxetine (CYMBALTA) 30 MG capsule TAKE 2 CAPSULES BY MOUTH EVERY DAY AS DIRECTED   fluticasone (FLONASE) 50 MCG/ACT nasal spray Place 2 sprays into both nostrils daily.   furosemide (LASIX) 20 MG tablet    JARDIANCE 25 MG TABS tablet TAKE ONE TABLET BY MOUTH EVERY DAY BEFORE BREAKFAST   losartan (COZAAR) 100 MG tablet losartan 100 mg tablet   meloxicam (MOBIC) 15 MG tablet Take 15 mg by mouth daily.    metFORMIN (GLUCOPHAGE) 1000 MG tablet Take 0.5 tablets (500 mg total) by mouth 2 (two) times daily with a  meal.   metoprolol tartrate (LOPRESSOR) 25 MG tablet Take 1 tablet (25 mg total) by mouth 2 (two) times daily.   pantoprazole (PROTONIX) 40 MG tablet TAKE ONE TABLET BY MOUTH TWICE DAILY BEFORE MEALS   polyethylene glycol powder (GLYCOLAX/MIRALAX) 17 GM/SCOOP powder Take 17 g by mouth daily as needed for mild constipation.   rosuvastatin (CRESTOR) 10 MG tablet TAKE ONE TABLET BY MOUTH EVERY DAY   tirzepatide (MOUNJARO) 7.5 MG/0.5ML Pen Inject 7.5 mg into the skin once a week.   No facility-administered encounter medications on file as of 03/24/2022.    Allergies (verified) Bupropion, Clarithromycin, Enalapril, Phentermine, Ceclor [cefaclor], Penicillins, and Sulfa antibiotics   History: Past Medical History:  Diagnosis Date   Anxiety    Depression    Diabetes mellitus 2010   Elevated BP    GERD (gastroesophageal reflux disease)    Hypertension    Kidney stone    Dr. DCyndie Mull April 2014   Renal cyst    Dr. Daniels-urologist   SVT (supraventricular tachycardia)    s/p cardioversion   Vitamin D deficiency    Past Surgical History:  Procedure Laterality Date   ABDOMINAL HYSTERECTOMY  09/27/13   Dr. BVeryl Speak  Arm Surgery     due to fracture  left elbow   CHOLECYSTECTOMY  5/07   Dr Bary Castilla   COLONOSCOPY  2007   Dr Bary Castilla   COLONOSCOPY WITH PROPOFOL N/A 06/20/2015   Procedure: COLONOSCOPY WITH PROPOFOL;  Surgeon: [name], MD;  Location: [facility] ENDOSCOPY;  Service: Endoscopy;  Laterality: N/A;   TUBAL LIGATION  02-21-01   Family History  Problem Relation Age of Onset   COPD Father    Stroke Father    Diabetes Mother        borderline   Hypertension Mother    Thyroid disease Mother    Breast cancer Maternal Aunt    Seizures Maternal Grandmother    Breast cancer Other    Cancer Other        breast   Colon cancer Sister        91   Colon cancer Cousin        40's   Social History   Socioeconomic History   Marital status: Married    Spouse  name: Not on file   Number of children: 0   Years of education: Not on file   Highest education level: Not on file  Occupational History   Occupation: Occupational hygienist of Adult nurse: OTHER   Occupation: retired  Tobacco Use   Smoking status: Never   Smokeless tobacco: Never  Scientific laboratory technician Use: Never used  Substance and Sexual Activity   Alcohol use: No   Drug use: No   Sexual activity: Yes    Birth control/protection: Surgical  Other Topics Concern   Not on file  Social History Narrative   Lives with husband, dog. 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 Hobbies - Quilting, gardening      Regular Exercise -  NO   Daily Caffeine Use:  1-2 cups coffee, 1-2 soda/tea               Social Determinants of Health   Financial Resource Strain: Low Risk  (03/24/2022)   Overall Financial Resource Strain (CARDIA)    Difficulty of Paying Living Expenses: Not hard at all  Food Insecurity: No Food Insecurity (03/24/2022)   Hunger Vital Sign    Worried About Running Out of Food in the Last Year: Never true    Ran Out of Food in the Last Year: Never true  Transportation Needs: No Transportation Needs (03/24/2022)   PRAPARE - Hydrologist (Medical): No    Lack of Transportation (Non-Medical): No  Physical Activity: Unknown (03/24/2022)   Exercise Vital Sign    Days of Exercise per Week: 0 days    Minutes of Exercise per Session: Not on file  Stress: No Stress Concern Present (03/24/2022)   [place]    Feeling of Stress : Not at all  Social Connections: Unknown (03/24/2022)   Social Connection and Isolation Panel [NHANES]    Frequency of Communication with Friends and Family: Not on file    Frequency of Social Gatherings with Friends and Family: Not on file    Attends Religious Services: Not on file    Active Member of Clubs or Organizations: Not  on file    Attends Archivist Meetings: Not on file    Marital Status: Married    Tobacco Counseling Counseling given: Not Answered   Clinical Intake:  Pre-visit preparation completed: Yes        Diabetes: Yes (  Followed by pcp)     Nutrition Risk Assessment: Has the patient had any N/V/D within the last 2 months?  No  Does the patient have any non-healing wounds?  No  Has the patient had any unintentional weight loss or weight gain?  No   Diabetes: Is the patient diabetic?  Yes  If diabetic, was a CBG obtained today?  No  Did the patient bring in their glucometer from home?  No  How often do you monitor your CBG's? A few times per week.   Financial Strains and Diabetes Management: Are you having any financial strains with the device, your supplies or your medication? No .  Does the patient want to be seen by Chronic Care Management for management of their diabetes?  No  Would the patient like to be referred to a Nutritionist or for Diabetic Management?  No         Activities of Daily Living    03/24/2022    9:19 AM  In your present state of health, do you have any difficulty performing the following activities:  Hearing? 0  Vision? 0  Difficulty concentrating or making decisions? 0  Walking or climbing stairs? 1  Comment Paces self with activity. Chronic knee pain. L worse than R.  Dressing or bathing? 0  Doing errands, shopping? 0  Preparing Food and eating ? N  Using the Toilet? N  In the past six months, have you accidently leaked urine? Y  Do you have problems with loss of bowel control? N  Managing your Medications? N  Managing your Finances? N  Housekeeping or managing your Housekeeping? N    Patient Care Team: Leone Haven, MD as PCP - General (Family Medicine) Earnestine Leys, MD (Orthopedic Surgery) Jackolyn Confer, MD (Internal Medicine) Bary Castilla Forest Gleason, MD (General Surgery)  Indicate any recent Medical Services you may  have received from other than [facility] providers in the past year (date may be approximate).     Assessment:   This is a routine wellness examination for Shelby Estes.  I connected with  Shelby Estes on 03/24/22 by a audio enabled telemedicine application and verified that I am speaking with the correct person using two identifiers.  Patient Location: Home  Provider Location: Office/Clinic  I discussed the limitations of evaluation and management by telemedicine. The patient expressed understanding and agreed to proceed.   Hearing/Vision screen Hearing Screening - Comments:: Patient is able to hear conversational tones without difficulty.  No issues reported.   Vision Screening - Comments:: Followed by [facility] Wears corrective lenses No retinopathy reported They have seen their ophthalmologist in the last 12 months.    Dietary issues and exercise activities discussed: Current Exercise Habits: The patient does not participate in regular exercise at present Healthy diet Good water intake   Goals Addressed               This Visit's Progress     Patient Stated     Weight (lb) < 200 lb (90.7 kg) (pt-stated)   231 lb (104.8 kg)     Lower A1c       Depression Screen    03/24/2022    9:17 AM 02/17/2022    8:55 AM 11/06/2021   11:27 AM 08/02/2021    9:28 AM 03/26/2021    4:03 PM 05/02/2020    9:37 AM 01/31/2020    8:14 AM  PHQ 2/9 Scores  PHQ - 2 Score 1 1 0 0  0 0 0    Fall Risk    03/24/2022    9:16 AM 02/17/2022    8:54 AM 11/06/2021   11:27 AM 08/02/2021    9:28 AM 03/26/2021    4:03 PM  Fall Risk   Falls in the past year? 0 0 0 0 0  Number falls in past yr: 0 0 0 0 0  Injury with Fall? 0 0 0 0 0  Risk for fall due to :  No Fall Risks No Fall Risks No Fall Risks   Follow up Falls evaluation completed;Falls prevention discussed Falls evaluation completed Falls evaluation completed Falls evaluation completed Falls evaluation completed    FALL RISK PREVENTION  PERTAINING TO THE HOME: Home free of loose throw rugs in walkways, pet beds, electrical cords, etc? Yes  Adequate lighting in your home to reduce risk of falls? Yes   ASSISTIVE DEVICES UTILIZED TO PREVENT FALLS: Life alert? No  Use of a cane, walker or w/c? No  Grab bars in the bathroom? Yes  Shower chair or bench in shower? Yes  Comfort chair height toilet? Yes   TIMED UP AND GO: Was the test performed? No .   Cognitive Function:        Immunizations Immunization History  Administered Date(s) Administered   Influenza Split 01/19/2011, 01/10/2013   Influenza-Unspecified 12/27/2011   PNEUMOCOCCAL CONJUGATE-20 11/06/2021   Pneumococcal Polysaccharide-23 12/14/2017   Td 01/08/2022   Tdap 09/11/2009, 07/19/2010   Zoster Recombinat (Shingrix) 12/27/2021   Zoster, Live 03/18/2007, 08/02/2011   Shingrix vaccine- 1st dose received. Agrees to update immunization record once completed.    Screening Tests Health Maintenance  Topic Date Due   INFLUENZA VACCINE  06/15/2022 (Originally 10/15/2021)   Zoster Vaccines- Shingrix (2 of 2) 06/23/2022 (Originally 02/21/2022)   OPHTHALMOLOGY EXAM  05/02/2022   Diabetic kidney evaluation - eGFR measurement  05/17/2022   FOOT EXAM  08/03/2022   HEMOGLOBIN A1C  08/19/2022   Diabetic kidney evaluation - Urine ACR  11/07/2022   Medicare Annual Wellness (AWV)  03/25/2023   MAMMOGRAM  04/20/2023   COLONOSCOPY (Pts 45-34yr Insurance coverage will need to be confirmed)  07/12/2023   DTaP/Tdap/Td (4 - Td or Tdap) 01/09/2032   Pneumonia Vaccine 67 Years old  Completed   DEXA SCAN  Completed   Hepatitis C Screening  Completed   HPV VACCINES  Aged Out   COVID-19 Vaccine  Discontinued    Health Maintenance  There are no preventive care reminders to display for this patient.   Lung Cancer Screening: (Low Dose CT Chest recommended if Age 67-80years, 30 pack-year currently smoking OR have quit w/in 15years.) does not qualify.   Hepatitis C  Screening: Completed 07/2017.   Vision Screening: Recommended annual ophthalmology exams for early detection of glaucoma and other disorders of the eye.  Dental Screening: Recommended annual dental exams for proper oral hygiene  Community Resource Referral / Chronic Care Management: CRR required this visit?  No   CCM required this visit?  No      Plan:     I have personally reviewed and noted the following in the patient's chart:   Medical and social history Use of alcohol, tobacco or illicit drugs  Current medications and supplements including opioid prescriptions. Patient is not currently taking opioid prescriptions. Functional ability and status Nutritional status Physical activity Advanced directives List of other physicians Hospitalizations, surgeries, and ER visits in previous 12 months Vitals Screenings to include cognitive, depression, and falls Referrals  and appointments  In addition, I have reviewed and discussed with patient certain preventive protocols, quality metrics, and best practice recommendations. A written personalized care plan for preventive services as well as general preventive health recommendations were provided to patient.     Leta Jungling, LPN   03/17/209

## 2022-03-24 NOTE — Patient Instructions (Addendum)
Shelby Estes , Thank you for taking time to come for your Medicare Wellness Visit. I appreciate your ongoing commitment to your health goals. Please review the following plan we discussed and let me know if I can assist you in the future.   These are the goals we discussed:  Goals       Patient Stated     Weight (lb) < 200 lb (90.7 kg) (pt-stated)      Lower A1c        This is a list of the screening recommended for you and due dates:  Health Maintenance  Topic Date Due   Flu Shot  06/15/2022*   Zoster (Shingles) Vaccine (2 of 2) 06/23/2022*   Eye exam for diabetics  05/02/2022   Yearly kidney function blood test for diabetes  05/17/2022   Complete foot exam   08/03/2022   Hemoglobin A1C  08/19/2022   Yearly kidney health urinalysis for diabetes  11/07/2022   Medicare Annual Wellness Visit  03/25/2023   Mammogram  04/20/2023   Colon Cancer Screening  07/12/2023   DTaP/Tdap/Td vaccine (4 - Td or Tdap) 01/09/2032   Pneumonia Vaccine  Completed   DEXA scan (bone density measurement)  Completed   Hepatitis C Screening: USPSTF Recommendation to screen - Ages 67-79 yo.  Completed   HPV Vaccine  Aged Out   COVID-19 Vaccine  Discontinued  *Topic was postponed. The date shown is not the original due date.    Advanced directives: End of life planning; Advanced aging; Advanced directives discussed.  No HCPOA/Living Will.  Additional information available in office as needed.   Conditions/risks identified: none new.  Next appointment: Follow up in one year for your annual wellness visit.   Preventive Care 67 Years and Older, Female Preventive care refers to lifestyle choices and visits with your health care provider that can promote health and wellness. What does preventive care include? A yearly physical exam. This is also called an annual well check. Dental exams once or twice a year. Routine eye exams. Ask your health care provider how often you should have your eyes  checked. Personal lifestyle choices, including: Daily care of your teeth and gums. Regular physical activity. Eating a healthy diet. Avoiding tobacco and drug use. Limiting alcohol use. Practicing safe sex. Taking low-dose aspirin every day. Taking vitamin and mineral supplements as recommended by your health care provider. What happens during an annual well check? The services and screenings done by your health care provider during your annual well check will depend on your age, overall health, lifestyle risk factors, and family history of disease. Counseling  Your health care provider may ask you questions about your: Alcohol use. Tobacco use. Drug use. Emotional well-being. Home and relationship well-being. Sexual activity. Eating habits. History of falls. Memory and ability to understand (cognition). Work and work Statistician. Reproductive health. Screening  You may have the following tests or measurements: Height, weight, and BMI. Blood pressure. Lipid and cholesterol levels. These may be checked every 5 years, or more frequently if you are over 18 years old. Skin check. Lung cancer screening. You may have this screening every year starting at age 67 if you have a 30-pack-year history of smoking and currently smoke or have quit within the past 15 years. Fecal occult blood test (FOBT) of the stool. You may have this test every year starting at age 67. Flexible sigmoidoscopy or colonoscopy. You may have a sigmoidoscopy every 5 years or a colonoscopy every 10 years  starting at age 67. Hepatitis C blood test. Hepatitis B blood test. Sexually transmitted disease (STD) testing. Diabetes screening. This is done by checking your blood sugar (glucose) after you have not eaten for a while (fasting). You may have this done every 1-3 years. Bone density scan. This is done to screen for osteoporosis. You may have this done starting at age 67. Mammogram. This may be done every 1-2  years. Talk to your health care provider about how often you should have regular mammograms. Talk with your health care provider about your test results, treatment options, and if necessary, the need for more tests. Vaccines  Your health care provider may recommend certain vaccines, such as: Influenza vaccine. This is recommended every year. Tetanus, diphtheria, and acellular pertussis (Tdap, Td) vaccine. You may need a Td booster every 10 years. Zoster vaccine. You may need this after age 67. Pneumococcal 13-valent conjugate (PCV13) vaccine. One dose is recommended after age 67. Pneumococcal polysaccharide (PPSV23) vaccine. One dose is recommended after age 67. Talk to your health care provider about which screenings and vaccines you need and how often you need them. This information is not intended to replace advice given to you by your health care provider. Make sure you discuss any questions you have with your health care provider. Document Released: 03/30/2015 Document Revised: 11/21/2015 Document Reviewed: 01/02/2015 Elsevier Interactive Patient Education  2017 [place] Prevention in the Home Falls can cause injuries. They can happen to people of all ages. There are many things you can do to make your home safe and to help prevent falls. What can I do on the outside of my home? Regularly fix the edges of walkways and driveways and fix any cracks. Remove anything that might make you trip as you walk through a door, such as a raised step or threshold. Trim any bushes or trees on the path to your home. Use bright outdoor lighting. Clear any walking paths of anything that might make someone trip, such as rocks or tools. Regularly check to see if handrails are loose or broken. Make sure that both sides of any steps have handrails. Any raised decks and porches should have guardrails on the edges. Have any leaves, snow, or ice cleared regularly. Use sand or salt on walking paths  during winter. Clean up any spills in your garage right away. This includes oil or grease spills. What can I do in the bathroom? Use night lights. Install grab bars by the toilet and in the tub and shower. Do not use towel bars as grab bars. Use non-skid mats or decals in the tub or shower. If you need to sit down in the shower, use a plastic, non-slip stool. Keep the floor dry. Clean up any water that spills on the floor as soon as it happens. Remove soap buildup in the tub or shower regularly. Attach bath mats securely with double-sided non-slip rug tape. Do not have throw rugs and other things on the floor that can make you trip. What can I do in the bedroom? Use night lights. Make sure that you have a light by your bed that is easy to reach. Do not use any sheets or blankets that are too big for your bed. They should not hang down onto the floor. Have a firm chair that has side arms. You can use this for support while you get dressed. Do not have throw rugs and other things on the floor that can make you trip. What  can I do in the kitchen? Clean up any spills right away. Avoid walking on wet floors. Keep items that you use a lot in easy-to-reach places. If you need to reach something above you, use a strong step stool that has a grab bar. Keep electrical cords out of the way. Do not use floor polish or wax that makes floors slippery. If you must use wax, use non-skid floor wax. Do not have throw rugs and other things on the floor that can make you trip. What can I do with my stairs? Do not leave any items on the stairs. Make sure that there are handrails on both sides of the stairs and use them. Fix handrails that are broken or loose. Make sure that handrails are as long as the stairways. Check any carpeting to make sure that it is firmly attached to the stairs. Fix any carpet that is loose or worn. Avoid having throw rugs at the top or bottom of the stairs. If you do have throw  rugs, attach them to the floor with carpet tape. Make sure that you have a light switch at the top of the stairs and the bottom of the stairs. If you do not have them, ask someone to add them for you. What else can I do to help prevent falls? Wear shoes that: Do not have high heels. Have rubber bottoms. Are comfortable and fit you well. Are closed at the toe. Do not wear sandals. If you use a stepladder: Make sure that it is fully opened. Do not climb a closed stepladder. Make sure that both sides of the stepladder are locked into place. Ask someone to hold it for you, if possible. Clearly mark and make sure that you can see: Any grab bars or handrails. First and last steps. Where the edge of each step is. Use tools that help you move around (mobility aids) if they are needed. These include: Canes. Walkers. Scooters. Crutches. Turn on the lights when you go into a dark area. Replace any light bulbs as soon as they burn out. Set up your furniture so you have a clear path. Avoid moving your furniture around. If any of your floors are uneven, fix them. If there are any pets around you, be aware of where they are. Review your medicines with your doctor. Some medicines can make you feel dizzy. This can increase your chance of falling. Ask your doctor what other things that you can do to help prevent falls. This information is not intended to replace advice given to you by your health care provider. Make sure you discuss any questions you have with your health care provider. Document Released: 12/28/2008 Document Revised: 08/09/2015 Document Reviewed: 04/07/2014 Elsevier Interactive Patient Education  2017 [employer].

## 2022-04-03 ENCOUNTER — Ambulatory Visit (INDEPENDENT_AMBULATORY_CARE_PROVIDER_SITE_OTHER): Payer: Medicare HMO

## 2022-04-03 DIAGNOSIS — E538 Deficiency of other specified B group vitamins: Secondary | ICD-10-CM | POA: Diagnosis not present

## 2022-04-03 MED ORDER — CYANOCOBALAMIN 1000 MCG/ML IJ SOLN
1000.0000 ug | Freq: Once | INTRAMUSCULAR | Status: AC
  Administered 2022-04-03: 1000 ug via INTRAMUSCULAR

## 2022-04-03 NOTE — Progress Notes (Signed)
Patient presented for B 12 injection to RIGHT deltoid, patient voiced no concerns nor showed any signs of distress during injection

## 2022-04-04 ENCOUNTER — Telehealth: Payer: Self-pay | Admitting: Family Medicine

## 2022-04-04 MED ORDER — ROSUVASTATIN CALCIUM 10 MG PO TABS: 10.0000 mg | ORAL_TABLET | Freq: Every day | ORAL | 3 refills | Status: DC

## 2022-04-04 NOTE — Telephone Encounter (Signed)
Prescription Request  04/04/2022  Is this a "Controlled Substance" medicine? No  LOV: 02/17/2022  What is the name of the medication or equipment? rosuvastatin (CRESTOR) 10 MG tablet  Have you contacted your pharmacy to request a refill? Yes   Which pharmacy would you like this sent to?    [facility] - [place], [place] - [address] [address] Phone: [phone] Fax: [phone]    Patient notified that their request is being sent to the clinical staff for review and that they should receive a response within 2 business days.   Please advise at Mobile [phone] (mobile)

## 2022-04-04 NOTE — Telephone Encounter (Signed)
Crestor 10 mg sent to [facility]

## 2022-04-17 ENCOUNTER — Ambulatory Visit (INDEPENDENT_AMBULATORY_CARE_PROVIDER_SITE_OTHER): Payer: Medicare HMO

## 2022-04-17 DIAGNOSIS — E538 Deficiency of other specified B group vitamins: Secondary | ICD-10-CM | POA: Diagnosis not present

## 2022-04-17 MED ORDER — CYANOCOBALAMIN 1000 MCG/ML IJ SOLN
1000.0000 ug | Freq: Once | INTRAMUSCULAR | Status: AC
  Administered 2022-04-17: 1000 ug via INTRAMUSCULAR

## 2022-04-21 ENCOUNTER — Other Ambulatory Visit: Payer: Self-pay | Admitting: Family Medicine

## 2022-04-21 ENCOUNTER — Other Ambulatory Visit: Payer: Self-pay | Admitting: Cardiovascular Disease

## 2022-04-21 DIAGNOSIS — K219 Gastro-esophageal reflux disease without esophagitis: Secondary | ICD-10-CM

## 2022-04-21 DIAGNOSIS — I340 Nonrheumatic mitral (valve) insufficiency: Secondary | ICD-10-CM

## 2022-04-28 NOTE — Progress Notes (Addendum)
Per documentation, pt received B12 injection in left delotid by Cathren Harsh, CMA. Pt tolerated it well with no complaints of concerns.

## 2022-05-01 ENCOUNTER — Ambulatory Visit (INDEPENDENT_AMBULATORY_CARE_PROVIDER_SITE_OTHER): Payer: Medicare HMO

## 2022-05-01 DIAGNOSIS — E538 Deficiency of other specified B group vitamins: Secondary | ICD-10-CM | POA: Diagnosis not present

## 2022-05-01 DIAGNOSIS — M65331 Trigger finger, right middle finger: Secondary | ICD-10-CM | POA: Diagnosis not present

## 2022-05-01 MED ORDER — CYANOCOBALAMIN 1000 MCG/ML IJ SOLN
1000.0000 ug | Freq: Once | INTRAMUSCULAR | Status: AC
  Administered 2022-05-01: 1000 ug via INTRAMUSCULAR

## 2022-05-01 NOTE — Progress Notes (Signed)
Patient presented for B 12 injection to right deltoid, patient voiced no concerns nor showed any signs of distress during injection. 

## 2022-05-05 ENCOUNTER — Other Ambulatory Visit: Payer: Self-pay

## 2022-05-06 ENCOUNTER — Ambulatory Visit: Payer: Medicare HMO | Admitting: Cardiovascular Disease

## 2022-05-06 DIAGNOSIS — I351 Nonrheumatic aortic (valve) insufficiency: Secondary | ICD-10-CM

## 2022-05-06 DIAGNOSIS — I371 Nonrheumatic pulmonary valve insufficiency: Secondary | ICD-10-CM

## 2022-05-06 DIAGNOSIS — I361 Nonrheumatic tricuspid (valve) insufficiency: Secondary | ICD-10-CM

## 2022-05-06 DIAGNOSIS — I340 Nonrheumatic mitral (valve) insufficiency: Secondary | ICD-10-CM | POA: Diagnosis not present

## 2022-05-06 MED ORDER — GNP TRUE METRIX GLUCOSE STRIPS VI STRP: ORAL_STRIP | 12 refills | Status: AC

## 2022-05-06 MED ORDER — TRUE METRIX AIR GLUCOSE METER W/DEVICE KIT: 1.0000 | PACK | Freq: Every day | 0 refills | Status: AC

## 2022-05-06 MED ORDER — TRUEPLUS LANCETS 30G MISC: 1 refills | Status: DC

## 2022-05-07 DIAGNOSIS — M65331 Trigger finger, right middle finger: Secondary | ICD-10-CM | POA: Diagnosis not present

## 2022-05-09 ENCOUNTER — Encounter: Payer: Self-pay | Admitting: Cardiovascular Disease

## 2022-05-09 ENCOUNTER — Ambulatory Visit (INDEPENDENT_AMBULATORY_CARE_PROVIDER_SITE_OTHER): Payer: Medicare HMO | Admitting: Cardiovascular Disease

## 2022-05-09 VITALS — BP 124/62 | HR 85 | Ht 63.0 in | Wt 221.0 lb

## 2022-05-09 DIAGNOSIS — I10 Essential (primary) hypertension: Secondary | ICD-10-CM

## 2022-05-09 DIAGNOSIS — R0789 Other chest pain: Secondary | ICD-10-CM | POA: Diagnosis not present

## 2022-05-09 DIAGNOSIS — I340 Nonrheumatic mitral (valve) insufficiency: Secondary | ICD-10-CM | POA: Diagnosis not present

## 2022-05-09 DIAGNOSIS — R0602 Shortness of breath: Secondary | ICD-10-CM

## 2022-05-09 NOTE — Progress Notes (Signed)
Cardiology Office Note   Date:  05/09/2022   ID:  Shelby Estes, Shelby Estes November 27, 1955, MRN [id number]  PCP:  Leone Haven, MD  Cardiologist:  Neoma Laming, MD      History of Present Illness: Shelby Estes is a 67 y.o. female who presents for  Chief Complaint  Patient presents with   Follow-up    Results    Shortness of Breath This is a chronic problem. The current episode started more than 1 year ago. The problem has been unchanged. Associated symptoms include chest pain. The symptoms are aggravated by any activity and emotional upset.  Chest Pain  This is a chronic problem. The current episode started more than 1 year ago. The problem occurs intermittently. The pain is at a severity of 6/10. The pain is moderate. The quality of the pain is described as tightness. Associated symptoms include shortness of breath.      Past Medical History:  Diagnosis Date   Anxiety    Depression    Diabetes mellitus 2010   Elevated BP    GERD (gastroesophageal reflux disease)    Hypertension    Kidney stone    Dr. Cyndie Mull, April 2014   Renal cyst    Dr. Daniels-urologist   SVT (supraventricular tachycardia)    s/p cardioversion   Vitamin D deficiency     ACTIVE PROBLEMS & CONDITIONS    Assessment of Shortness of Breath    Difficulty Breathing (Dyspnea)    Esophageal Reflux    History of Esophageal Reflux    History of Essential Hypertension    History of Hyperlipidemia    Hyperlipidemia    Hypertension Systemic    Palpitations    Reported Family History of Heart Disease - DAD had to have a stent , and copd    Sleep apnea, unspecified    Tachycardia Paroxysmal Supraventricular     CHIEF COMPLAINT  The Chief Complaint is: Follow up- medication refills.     HISTORY OF PRESENT ILLNESS  Shelby Estes is a 67 year old female.   Patient in office for routine cardiac exam. Denies chest pain. Shortness of breath on exertion.  Allergy list reviewed   Problem  list reviewed   Medication list reviewed    No chest pain or discomfort   No palpitations  , and The heart rate was not fast    Not feeling congested in the chest   No dyspnea   No cough   No wheezing     CURRENT MEDICATION    Adult Aspirin EC Low Strength 81 MG Tablet Delayed Release once a day, 30 days, 11 refills    amLODIPine Besylate 5 MG Oral Tablet once a day 0 days, 0 refills    Colchicine 0.6 MG Oral Capsule as needed 0 days, 0 refills    Crestor '10MG'$  Oral Tablet 10 MG  qd, 0 days, 0 refills    [employer] Vitamin D 2000 UNIT Capsule, conventional once a day 0 days, 0 refills    DULoxetine HCl 30 MG Oral Capsule Delayed Release Particles  2 tablets,once a day, 0 days, 0 refills    Enalapril Maleate 2.5 MG Tablet once a day, 90 days, 3 refills    Jardiance 25 MG Oral Tablet  1 tablet, once a day, 0 days, 0 refills    Losartan Potassium 100 MG Oral Tablet TAKE 1 TABLET BY MOUTH DAILY ALONG WITH HCTZ, 90 days, 0 refills    Meloxicam '15MG'$  Oral Tablet  15 MG  qd, 0 days, 0 refills    metFORMIN HCl 500 MG Oral Tablet twice a day 0 days, 0 refills    Metoprolol Tartrate 25 MG Oral Tablet TAKE 1 TABLET BY MOUTH TWICE DAILY, 90 days, 2 refills    Trulicity 1.5 0000000 Subcutaneous Solution Pen-injector once a week 0 days, 0 refills     PAST MEDICAL/SURGICAL HISTORY  Diagnoses:  Mitral regurgitation Essential hypertension.   Esophageal reflux.   Hyperlipidemia Obesity.   Diabetes mellitus  Type 2 diabetes mellitus Hyperglycemia/Borderline DM  Kidney stones    Surgeres:  Lap cholecystectomy  Tubal ligation  elbow surgery.     SOCIAL HISTORY  Tobacco use:  Not a current smoker and smoking status: Never smoker. Alcohol: Not using alcohol. Habits: Poor exercise habits. 2-3 cups/day.     ALLERGIES    ACE Inhibitors     Reaction: , Shortness of Breath / Dyspnea, edema    Ceclor     Reaction: edema    Penicillins     Reaction: , Hives / Urticaria, edema    Sulfa Antibiotics      Reaction: , Hives / Urticaria, edema     FAMILY HISTORY  Heart disease DAD had to have a stent , and copd HTN-father  CAD-father  DM-mother  TIA-mother and father  Breast aunt-maternal aunt x 2  Systemic hypertension     REVIEW OF SYSTEMS  Systemic: Not feeling poorly (malaise).  No recent weight change. Head: No headache and no sinus pain. Cardiovascular: No chest pain or discomfort, no palpitations, and the heart rate was not fast. Pulmonary: No dyspnea, no cough, and no wheezing. Gastrointestinal: No heartburn.  No nausea, no vomiting, no abdominal pain, and no diarrhea. Neurological: No dizziness, no vertigo, and no fainting. Psychological: No anxiety and no depression.     PHYSICAL FINDINGS    Vitals taken 11/14/2021 11:27 am  BP-Sitting L  126/72 mmHg 100 - 120/56 - 80  Pulse Rate-Sitting  85 bpm 50 - 100  Temp-Tympanic  98.2 F 96 - 101  Height  63 in 59 - 68  Weight  242 lbs 9.6 oz 96 - 178  Body Mass Index  43 kg/m2   Body Surface Area  2.1 m2   Oxygen Saturation  93 % 93 - 100    General Appearance:  In no acute distress. Lungs:  Clear to auscultation. Cardiovascular: Jugular Venous Distention:  JVD not increased.   JVD not increased. Heart Rate And Rhythm:  Normal.   Normal. Heart Sounds:  Normal. Murmurs:  No murmurs were heard. Carotid Arteries:  No bruit in the carotid artery. Arterial Pulses:  Equal bilaterally and normal. Edema:  Not present. Abdomen: Auscultation:  Bowel sounds were normal. Palpation:  Abdominal non-tender. Neurological:  Oriented to time, place, and person.     ASSESSMENT   Mitral regurgitation  Systemic hypertension  Esophageal reflux  Hyperlipidemia  Obesity  Type 2 diabetes mellitus     THERAPY   Continue current medication.  Clinical summary provided to patient.     COUNSELING/EDUCATION   Education and counseling diet and exercise     PLAN     Nonrheumatic mitral (valve)  insufficiency RADIOLOGY/ULTRASOUND: Echo     Return to the clinic if condition worsens or new symptoms arise  Follow-up visit Patient doing wrll. No cardiac complaints.    Return in 6 momths with echo prior.   Neoma Laming MD  Electronically signed by: Neoma Laming  Date: 11/14/2021 12:26  Past Surgical History:  Procedure Laterality Date   ABDOMINAL HYSTERECTOMY  09/27/13   Dr. Veryl Speak   Arm Surgery     due to fracture left elbow   CHOLECYSTECTOMY  5/07   Dr Bary Castilla   COLONOSCOPY  2007   Dr Bary Castilla   COLONOSCOPY WITH PROPOFOL N/A 06/20/2015   Procedure: COLONOSCOPY WITH PROPOFOL;  Surgeon: [name], MD;  Location: [facility] ENDOSCOPY;  Service: Endoscopy;  Laterality: N/A;   TUBAL LIGATION  02-21-01     Current Outpatient Medications  Medication Sig Dispense Refill   ADULT ASPIRIN REGIMEN 81 MG EC tablet      amLODipine (NORVASC) 5 MG tablet Take 1 tablet (5 mg total) by mouth daily. 90 tablet 2   Blood Glucose Monitoring Suppl (TRUE METRIX AIR GLUCOSE METER) w/Device KIT 1 each by Does not apply route daily. 1 kit 0   Cholecalciferol (VITAMIN D) 2000 UNITS CAPS Take 1 capsule by mouth daily.     colchicine 0.6 MG tablet Take 0.6 mg by mouth 2 (two) times daily.     DULoxetine (CYMBALTA) 30 MG capsule TAKE 2 CAPSULES BY MOUTH EVERY DAY AS DIRECTED 180 capsule 1   fluticasone (FLONASE) 50 MCG/ACT nasal spray Place 2 sprays into both nostrils daily. 16 g 6   glucose blood (GNP TRUE METRIX GLUCOSE STRIPS) test strip Check once daily. 100 each 12   JARDIANCE 25 MG TABS tablet TAKE ONE TABLET BY MOUTH EVERY DAY BEFORE BREAKFAST 90 tablet 1   losartan (COZAAR) 100 MG tablet TAKE ONE TABLET BY MOUTH EVERY DAY ALONGWITH HCTZ 90 tablet 1   meloxicam (MOBIC) 15 MG tablet Take 15 mg by mouth daily.      metFORMIN (GLUCOPHAGE) 1000 MG tablet Take 0.5 tablets (500 mg total) by mouth 2 (two) times daily with a meal. 180 tablet 1   metoprolol tartrate (LOPRESSOR) 25 MG tablet  Take 1 tablet (25 mg total) by mouth 2 (two) times daily. 60 tablet 6   pantoprazole (PROTONIX) 40 MG tablet TAKE ONE TABLET BY MOUTH TWICE DAILY BEFORE MEALS 60 tablet 3   polyethylene glycol powder (GLYCOLAX/MIRALAX) 17 GM/SCOOP powder Take 17 g by mouth daily as needed for mild constipation. 500 g 0   rosuvastatin (CRESTOR) 10 MG tablet Take 1 tablet (10 mg total) by mouth daily. 90 tablet 3   tirzepatide (MOUNJARO) 7.5 MG/0.5ML Pen Inject 7.5 mg into the skin once a week. 6 mL 3   TRUEplus Lancets 30G MISC Use to check glucose once daily. 100 each 1   doxycycline (VIBRA-TABS) 100 MG tablet Take 1 tablet (100 mg total) by mouth 2 (two) times daily. 14 tablet 0   furosemide (LASIX) 20 MG tablet      rosuvastatin (CRESTOR) 10 MG tablet TAKE ONE TABLET BY MOUTH EVERY DAY 90 tablet 1   No current facility-administered medications for this visit.    Allergies:   Bupropion, Clarithromycin, Enalapril, Phentermine, Ceclor [cefaclor], Penicillins, and Sulfa antibiotics    Social History:   reports that she has never smoked. She has never used smokeless tobacco. She reports that she does not drink alcohol and does not use drugs.   Family History:  family history includes Breast cancer in her maternal aunt and another family member; COPD in her father; Cancer in an other family member; Colon cancer in her cousin and sister; Diabetes in her mother; Hypertension in her mother; Seizures in her maternal grandmother; Stroke in her father; Thyroid disease  in her mother.    ROS:     Review of Systems  Constitutional: Negative.   HENT: Negative.    Eyes: Negative.   Respiratory:  Positive for shortness of breath.   Cardiovascular:  Positive for chest pain.  Gastrointestinal: Negative.   Genitourinary: Negative.   Musculoskeletal: Negative.   Skin: Negative.   Neurological: Negative.   Endo/Heme/Allergies: Negative.   Psychiatric/Behavioral: Negative.    All other systems reviewed and are  negative.     All other systems are reviewed and negative.    PHYSICAL EXAM: VS:  BP 124/62   Pulse 85   Ht '5\' 3"'$  (1.6 m)   Wt 221 lb (100.2 kg)   SpO2 98%   BMI 39.15 kg/m  , BMI Body mass index is 39.15 kg/m. Last weight:  Wt Readings from Last 3 Encounters:  05/09/22 221 lb (100.2 kg)  05/07/22 231 lb (104.8 kg)  03/24/22 231 lb (104.8 kg)     Physical Exam Constitutional:      Appearance: Normal appearance.  Cardiovascular:     Rate and Rhythm: Normal rate and regular rhythm.     Heart sounds: Normal heart sounds.  Pulmonary:     Effort: Pulmonary effort is normal.     Breath sounds: Normal breath sounds.  Musculoskeletal:     Right lower leg: No edema.     Left lower leg: No edema.  Neurological:     Mental Status: She is alert.      EKG: none today  Recent Labs: 05/16/2021: BUN 28; Creatinine, Ser 1.22; Potassium 3.9; Sodium 142 02/17/2022: Hemoglobin 12.6; Platelets 499.0    Lipid Panel    Component Value Date/Time   CHOL 127 05/03/2021 1037   CHOL 115 05/02/2020 0955   TRIG 217.0 (H) 05/03/2021 1037   HDL 46.40 05/03/2021 1037   HDL 36 (L) 05/02/2020 0955   CHOLHDL 3 05/03/2021 1037   VLDL 43.4 (H) 05/03/2021 1037   LDLCALC 40 05/02/2020 0955   LDLDIRECT 50.0 05/03/2021 1037      REASON FOR VISIT  Visit for: Echocardiogram/ Post Covid condition  Sex:        female   wt=  228  lbs.  BP=126/82  Height=   63 inches.        TESTS  Imaging: Echocardiogram:  An echocardiogram in (2-d) mode was performed and in Doppler mode with color flow velocity mapping was performed. The aortic valve cusps are abnormal 1.8  cm, flow velocity 1.4  m/s, and systolic calculated mean flow gradient 4  mmHg. Mitral valve diastolic peak flow velocity E 0.7   m/s and E/A ratio 1.2. Aortic root diameter 3.4  cm. The LVOT internal diameter 1.9  cm and flow velocity was abnormal 1   m/s. LV systolic dimension 2.7    cm, diastolic 4.3 cm, posterior wall thickness  1 cm, fractional shortening 37 %, and EF 52 %. IVS thickness 1  cm. LA dimension 3.1 cm  RIGHT atrium=  13.2  cm2. Mitral Valve =  Ea=10.2  DT= 201 msec. Tricuspid Valve =  TR jet V=   2.4   RAP=5  RVSP= 29    mmHg. Aortic Valve is Normal. Mitral Valve has Trace Regurgitation. Pulmonic Valve is Normal. Tricuspid Valve has Mild Regurgitation.     ASSESSMENT  Technically adequate study.  Ejection fraction-52  Left Ventricle- Normal size and function  Left Ventricle diastolic dysfunction grade-Normal diastolic function  Right Ventricle- Normal size and function  No wall motion abnormalities  Left Atrium-Normal size  Right Atrium-Normal size  Aortic valve-No stenosis or regurgitation  Pulmonic Valve-No stenosis or regurgitation  Mitral Valve-Trace regurgitation  Tricuspid Valve-Mild Regurgitation  No Pericardial effusion.     THERAPY   Referring physician: Dionisio David  Sonographer: Ruta Hinds.   Neoma Laming MD  Electronically signed by: Neoma Laming     Date: 06/22/2020 10:39 Other studies Reviewed: Additional studies/ records that were reviewed today include:  Review of the above records demonstrates:       No data to display          REASON FOR VISIT  Referred by Neoma Laming.        TESTS  Imaging: Computed Tomographic Angiography:  Cardiac multidetector CT was performed paying particular attention to the coronary arteries for the diagnosis of: chest pain. Diagnostic Drugs:  Administered iohexol (Omnipaque) through an antecubital vein and images from the examination were analyzed for the presence and extent of coronary artery disease, using 3D image processing software. 100 mL of non-ionic contrast (Omnipaque) was used.         TEST CONCLUSIONS  1 - Calcium score is 0.  2 - Right dominant system.  3 - Normal coronaries.   Neoma Laming, MD  Electronically signed by: Neoma Laming     Date: 09/28/2012 10:53   ASSESSMENT AND PLAN:     ICD-10-CM   1. Primary hypertension  I10 MYOCARDIAL PERFUSION IMAGING    2. Nonrheumatic mitral valve regurgitation  I34.0 MYOCARDIAL PERFUSION IMAGING    3. Other chest pain  R07.89 MYOCARDIAL PERFUSION IMAGING   workup in past negative but has tightness, and echo had LVEF 58% from 52% a year ago. Advise stress test    4. SOB (shortness of breath)  R06.02 MYOCARDIAL PERFUSION IMAGING       Problem List Items Addressed This Visit       Cardiovascular and Mediastinum   Hypertension - Primary (Chronic)   Relevant Orders   MYOCARDIAL PERFUSION IMAGING   Other Visit Diagnoses     Nonrheumatic mitral valve regurgitation       Relevant Orders   MYOCARDIAL PERFUSION IMAGING   Other chest pain       workup in past negative but has tightness, and echo had LVEF 58% from 52% a year ago. Advise stress test   Relevant Orders   MYOCARDIAL PERFUSION IMAGING   SOB (shortness of breath)       Relevant Orders   MYOCARDIAL PERFUSION IMAGING          Disposition:   Return in about 4 weeks (around 06/06/2022) for after stress test.    Total time spent: 30 minutes  Signed,  Neoma Laming, MD  05/09/2022 9:58 [place]

## 2022-05-15 ENCOUNTER — Ambulatory Visit (INDEPENDENT_AMBULATORY_CARE_PROVIDER_SITE_OTHER): Payer: Medicare HMO

## 2022-05-15 DIAGNOSIS — E538 Deficiency of other specified B group vitamins: Secondary | ICD-10-CM | POA: Diagnosis not present

## 2022-05-15 MED ORDER — CYANOCOBALAMIN 1000 MCG/ML IJ SOLN
1000.0000 ug | Freq: Once | INTRAMUSCULAR | Status: AC
  Administered 2022-05-15: 1000 ug via INTRAMUSCULAR

## 2022-05-15 NOTE — Progress Notes (Signed)
Patient presented for B 12 injection to left deltoid, patient voiced no concerns nor showed any signs of distress during injection. 

## 2022-05-19 ENCOUNTER — Ambulatory Visit (INDEPENDENT_AMBULATORY_CARE_PROVIDER_SITE_OTHER): Payer: Medicare HMO

## 2022-05-19 DIAGNOSIS — I340 Nonrheumatic mitral (valve) insufficiency: Secondary | ICD-10-CM

## 2022-05-19 DIAGNOSIS — I10 Essential (primary) hypertension: Secondary | ICD-10-CM

## 2022-05-19 DIAGNOSIS — R0789 Other chest pain: Secondary | ICD-10-CM

## 2022-05-19 DIAGNOSIS — R0602 Shortness of breath: Secondary | ICD-10-CM | POA: Diagnosis not present

## 2022-05-19 MED ORDER — TECHNETIUM TC 99M SESTAMIBI GENERIC - CARDIOLITE
31.4000 | Freq: Once | INTRAVENOUS | Status: AC | PRN
  Administered 2022-05-19: 31.4 via INTRAVENOUS

## 2022-05-19 MED ORDER — TECHNETIUM TC 99M SESTAMIBI GENERIC - CARDIOLITE
10.5000 | Freq: Once | INTRAVENOUS | Status: AC | PRN
  Administered 2022-05-19: 10.5 via INTRAVENOUS

## 2022-05-20 ENCOUNTER — Ambulatory Visit: Payer: Medicare HMO | Admitting: Family Medicine

## 2022-05-20 NOTE — Progress Notes (Signed)
Closing encounter for provider.

## 2022-05-29 ENCOUNTER — Ambulatory Visit: Payer: Medicare HMO

## 2022-05-29 ENCOUNTER — Other Ambulatory Visit: Payer: Self-pay | Admitting: Family Medicine

## 2022-05-29 DIAGNOSIS — I10 Essential (primary) hypertension: Secondary | ICD-10-CM

## 2022-05-29 DIAGNOSIS — E1169 Type 2 diabetes mellitus with other specified complication: Secondary | ICD-10-CM

## 2022-05-30 ENCOUNTER — Other Ambulatory Visit: Payer: Self-pay | Admitting: *Deleted

## 2022-05-30 MED ORDER — BD SWAB SINGLE USE REGULAR PADS: MEDICATED_PAD | 3 refills | Status: AC

## 2022-05-30 NOTE — Telephone Encounter (Signed)
New prescription request pended below for approval.

## 2022-06-06 ENCOUNTER — Encounter: Payer: Self-pay | Admitting: Cardiovascular Disease

## 2022-06-06 ENCOUNTER — Ambulatory Visit: Payer: Medicare HMO | Admitting: Cardiovascular Disease

## 2022-06-06 ENCOUNTER — Ambulatory Visit (INDEPENDENT_AMBULATORY_CARE_PROVIDER_SITE_OTHER): Payer: Medicare HMO | Admitting: Family Medicine

## 2022-06-06 VITALS — BP 128/82 | HR 81 | Temp 97.7°F | Ht 63.0 in | Wt 222.6 lb

## 2022-06-06 VITALS — BP 120/80 | HR 100 | Ht 63.0 in | Wt 224.8 lb

## 2022-06-06 DIAGNOSIS — I10 Essential (primary) hypertension: Secondary | ICD-10-CM

## 2022-06-06 DIAGNOSIS — E669 Obesity, unspecified: Secondary | ICD-10-CM

## 2022-06-06 DIAGNOSIS — E538 Deficiency of other specified B group vitamins: Secondary | ICD-10-CM | POA: Diagnosis not present

## 2022-06-06 DIAGNOSIS — G4733 Obstructive sleep apnea (adult) (pediatric): Secondary | ICD-10-CM

## 2022-06-06 DIAGNOSIS — R0789 Other chest pain: Secondary | ICD-10-CM | POA: Diagnosis not present

## 2022-06-06 DIAGNOSIS — E1169 Type 2 diabetes mellitus with other specified complication: Secondary | ICD-10-CM

## 2022-06-06 MED ORDER — TIRZEPATIDE 10 MG/0.5ML ~~LOC~~ SOAJ: 10.0000 mg | SUBCUTANEOUS | 2 refills | Status: DC

## 2022-06-06 NOTE — Assessment & Plan Note (Signed)
BP ok 

## 2022-06-06 NOTE — Assessment & Plan Note (Signed)
Chronic issue.  Check A1c.  Increase mounjaro to 10 mg weekly for weight loss benefits.  Continue Jardiance 25 mg daily and metformin 500 mg twice daily.

## 2022-06-06 NOTE — Assessment & Plan Note (Signed)
Chronic issue.  Continue every 2-week B12 injections.  Check B12 today.

## 2022-06-06 NOTE — Assessment & Plan Note (Signed)
Chronic issue.  Diastolic blood pressure ranges intermittently above goal though she is not checking her blood pressure frequently.  I have asked her to check it on a daily basis.  Her goal is less than 130/80.  She will continue metoprolol 25 mg twice daily, losartan 100 mg daily, and amlodipine 5 mg daily.

## 2022-06-06 NOTE — Patient Instructions (Signed)
Nice to see you. You can increase the Mounjaro to 10 mg weekly.  If you have any nausea or other side effects please let us know. Please start checking your blood pressure daily at home.  Your goal blood pressure is less than 130/80.

## 2022-06-06 NOTE — Assessment & Plan Note (Signed)
Chronic issue.  Requesting a compliance report given her hypersomnia.  She will continue her CPAP.

## 2022-06-06 NOTE — Progress Notes (Signed)
Cardiology Office Note   Date:  06/06/2022   ID:  AZIZI LILE, DOB [birth year], MRN [id number]  PCP:  Leone Haven, MD  Cardiologist:  Neoma Laming, MD      History of Present Illness: Shelby Estes is a 66 y.o. female who presents for  Chief Complaint  Patient presents with   Follow-up    4 week fu    No chest pain or SOB      Past Medical History:  Diagnosis Date   Anxiety    Depression    Diabetes mellitus 2010   Elevated BP    GERD (gastroesophageal reflux disease)    Hypertension    Kidney stone    Dr. Cyndie Mull, April 2014   Renal cyst    Dr. Cyndie Mull   SVT (supraventricular tachycardia)    s/p cardioversion   Vitamin D deficiency      Past Surgical History:  Procedure Laterality Date   ABDOMINAL HYSTERECTOMY  09/27/13   Dr. Veryl Speak   Arm Surgery     due to fracture left elbow   CHOLECYSTECTOMY  5/07   Dr Bary Castilla   COLONOSCOPY  2007   Dr Bary Castilla   COLONOSCOPY WITH PROPOFOL N/A 06/20/2015   Procedure: COLONOSCOPY WITH PROPOFOL;  Surgeon: [name], MD;  Location: [facility] ENDOSCOPY;  Service: Endoscopy;  Laterality: N/A;   TUBAL LIGATION  02-21-01     Current Outpatient Medications  Medication Sig Dispense Refill   ADULT ASPIRIN REGIMEN 81 MG EC tablet      Alcohol Swabs (B-D SINGLE USE SWABS REGULAR) PADS Use daily to clean skin prior to testing/injection 100 each 3   amLODipine (NORVASC) 5 MG tablet TAKE ONE TABLET BY MOUTH DAILY 90 tablet 2   Blood Glucose Monitoring Suppl (TRUE METRIX AIR GLUCOSE METER) w/Device KIT 1 each by Does not apply route daily. 1 kit 0   Cholecalciferol (VITAMIN D) 2000 UNITS CAPS Take 1 capsule by mouth daily.     colchicine 0.6 MG tablet Take 0.6 mg by mouth 2 (two) times daily.     DULoxetine (CYMBALTA) 30 MG capsule TAKE 2 CAPSULES BY MOUTH EVERY DAY AS DIRECTED 180 capsule 1   fluticasone (FLONASE) 50 MCG/ACT nasal spray Place 2 sprays into both nostrils daily. 16 g 6    glucose blood (GNP TRUE METRIX GLUCOSE STRIPS) test strip Check once daily. 100 each 12   JARDIANCE 25 MG TABS tablet TAKE ONE TABLET BY MOUTH EVERY DAY BEFORE BREAKFAST 90 tablet 1   losartan (COZAAR) 100 MG tablet TAKE ONE TABLET BY MOUTH EVERY DAY ALONGWITH HCTZ 90 tablet 1   meloxicam (MOBIC) 15 MG tablet Take 15 mg by mouth daily.      metFORMIN (GLUCOPHAGE) 1000 MG tablet TAKE 1/2 TABLET BY MOUTH TWICE DAILY WITH A MEAL 180 tablet 1   metoprolol tartrate (LOPRESSOR) 25 MG tablet Take 1 tablet (25 mg total) by mouth 2 (two) times daily. 60 tablet 6   pantoprazole (PROTONIX) 40 MG tablet TAKE ONE TABLET BY MOUTH TWICE DAILY BEFORE MEALS 60 tablet 3   rosuvastatin (CRESTOR) 10 MG tablet Take 1 tablet (10 mg total) by mouth daily. 90 tablet 3   tirzepatide (MOUNJARO) 7.5 MG/0.5ML Pen Inject 7.5 mg into the skin once a week. 6 mL 3   TRUEplus Lancets 30G MISC Use to check glucose once daily. 100 each 1   polyethylene glycol powder (GLYCOLAX/MIRALAX) 17 GM/SCOOP powder Take 17 g by mouth daily as  needed for mild constipation. (Patient not taking: Reported on 06/06/2022) 500 g 0   No current facility-administered medications for this visit.    Allergies:   Bupropion, Clarithromycin, Enalapril, Phentermine, Ceclor [cefaclor], Penicillins, and Sulfa antibiotics    Social History:   reports that she has never smoked. She has never used smokeless tobacco. She reports that she does not drink alcohol and does not use drugs.   Family History:  family history includes Breast cancer in her maternal aunt and another family member; COPD in her father; Cancer in an other family member; Colon cancer in her cousin and sister; Diabetes in her mother; Hypertension in her mother; Seizures in her maternal grandmother; Stroke in her father; Thyroid disease in her mother.    ROS:     Review of Systems  Constitutional: Negative.   HENT: Negative.    Eyes: Negative.   Respiratory: Negative.     Gastrointestinal: Negative.   Genitourinary: Negative.   Musculoskeletal: Negative.   Skin: Negative.   Neurological: Negative.   Endo/Heme/Allergies: Negative.   Psychiatric/Behavioral: Negative.    All other systems reviewed and are negative.     All other systems are reviewed and negative.    PHYSICAL EXAM: VS:  BP 120/80   Pulse 100   Ht 5\' 3"  (1.6 m)   Wt 224 lb 12.8 oz (102 kg)   SpO2 94%   BMI 39.82 kg/m  , BMI Body mass index is 39.82 kg/m. Last weight:  Wt Readings from Last 3 Encounters:  06/06/22 224 lb 12.8 oz (102 kg)  05/09/22 221 lb (100.2 kg)  05/07/22 231 lb (104.8 kg)     Physical Exam Constitutional:      Appearance: Normal appearance.  Cardiovascular:     Rate and Rhythm: Normal rate and regular rhythm.     Heart sounds: Normal heart sounds.  Pulmonary:     Effort: Pulmonary effort is normal.     Breath sounds: Normal breath sounds.  Musculoskeletal:     Right lower leg: No edema.     Left lower leg: No edema.  Neurological:     Mental Status: She is alert.       EKG:   Recent Labs: 02/17/2022: Hemoglobin 12.6; Platelets 499.0    Lipid Panel    Component Value Date/Time   CHOL 127 05/03/2021 1037   CHOL 115 05/02/2020 0955   TRIG 217.0 (H) 05/03/2021 1037   HDL 46.40 05/03/2021 1037   HDL 36 (L) 05/02/2020 0955   CHOLHDL 3 05/03/2021 1037   VLDL 43.4 (H) 05/03/2021 1037   LDLCALC 40 05/02/2020 0955   LDLDIRECT 50.0 05/03/2021 1037      Other studies Reviewed: Additional studies/ records that were reviewed today include:  Review of the above records demonstrates:       No data to display            ASSESSMENT AND PLAN:    ICD-10-CM   1. Chest pain, non-cardiac  R07.89    stress test was unremarkable, normal LVEF    2. Primary hypertension  I10     3. OSA on CPAP  G47.33        Problem List Items Addressed This Visit       Cardiovascular and Mediastinum   Hypertension (Chronic)    BP ok         Respiratory   OSA on CPAP (Chronic)   Other Visit Diagnoses     Chest pain, non-cardiac    -  Primary   stress test was unremarkable, normal LVEF          Disposition:   Return in about 3 months (around 09/06/2022).    Total time spent: 30 minutes  Signed,  Neoma Laming, MD  06/06/2022 12:46 [place]

## 2022-06-06 NOTE — Progress Notes (Signed)
Tommi Rumps, MD Phone: [phone]  Shelby Estes is a 67 y.o. female who presents today for follow-up.  HYPERTENSION Disease Monitoring Home BP Monitoring 115-125/75-85 Chest pain- no    Dyspnea- no Medications Compliance-  taking amlodipine, losartan, metoprolol BMET    Component Value Date/Time   NA 142 05/16/2021 1434   NA 144 01/31/2020 0832   NA 140 09/22/2013 1523   K 3.9 05/16/2021 1434   K 3.8 09/22/2013 1523   CL 101 05/16/2021 1434   CL 104 09/22/2013 1523   CO2 29 05/16/2021 1434   CO2 28 09/22/2013 1523   GLUCOSE 111 (H) 05/16/2021 1434   GLUCOSE 105 (H) 09/22/2013 1523   BUN 28 (H) 05/16/2021 1434   BUN 11 01/31/2020 0832   BUN 10 09/22/2013 1523   CREATININE 1.22 (H) 05/16/2021 1434   CREATININE 0.87 09/22/2013 1523   CALCIUM 9.7 05/16/2021 1434   CALCIUM 9.8 09/22/2013 1523   GFRNONAA 57 (L) 01/31/2020 0832   GFRNONAA >60 09/22/2013 1523   GFRAA 66 01/31/2020 0832   GFRAA >60 09/22/2013 1523   DIABETES Disease Monitoring: Blood Sugar ranges-80-135 Polyuria/phagia/dipsia- no       Medications: Compliance- taking jardiance, metformin, mounjaro, patient wonders if we can go up on the mounjaro to help with weight loss Hypoglycemic symptoms- no  Hypersomnia: Patient notes she is tired throughout the day.  Notes she will take a nap once a day.  She notes she does wake up well rested.  She does use her CPAP nightly.  Her cardiologist got her on the CPAP previously.   Social History   Tobacco Use  Smoking Status Never  Smokeless Tobacco Never    Current Outpatient Medications on File Prior to Visit  Medication Sig Dispense Refill   ADULT ASPIRIN REGIMEN 81 MG EC tablet      Alcohol Swabs (B-D SINGLE USE SWABS REGULAR) PADS Use daily to clean skin prior to testing/injection 100 each 3   amLODipine (NORVASC) 5 MG tablet TAKE ONE TABLET BY MOUTH DAILY 90 tablet 2   Blood Glucose Monitoring Suppl (TRUE METRIX AIR GLUCOSE METER) w/Device KIT 1 each  by Does not apply route daily. 1 kit 0   Cholecalciferol (VITAMIN D) 2000 UNITS CAPS Take 1 capsule by mouth daily.     colchicine 0.6 MG tablet Take 0.6 mg by mouth 2 (two) times daily.     DULoxetine (CYMBALTA) 30 MG capsule TAKE 2 CAPSULES BY MOUTH EVERY DAY AS DIRECTED 180 capsule 1   fluticasone (FLONASE) 50 MCG/ACT nasal spray Place 2 sprays into both nostrils daily. 16 g 6   glucose blood (GNP TRUE METRIX GLUCOSE STRIPS) test strip Check once daily. 100 each 12   JARDIANCE 25 MG TABS tablet TAKE ONE TABLET BY MOUTH EVERY DAY BEFORE BREAKFAST 90 tablet 1   losartan (COZAAR) 100 MG tablet TAKE ONE TABLET BY MOUTH EVERY DAY ALONGWITH HCTZ 90 tablet 1   meloxicam (MOBIC) 15 MG tablet Take 15 mg by mouth daily.      metFORMIN (GLUCOPHAGE) 1000 MG tablet TAKE 1/2 TABLET BY MOUTH TWICE DAILY WITH A MEAL 180 tablet 1   metoprolol tartrate (LOPRESSOR) 25 MG tablet Take 1 tablet (25 mg total) by mouth 2 (two) times daily. 60 tablet 6   pantoprazole (PROTONIX) 40 MG tablet TAKE ONE TABLET BY MOUTH TWICE DAILY BEFORE MEALS 60 tablet 3   polyethylene glycol powder (GLYCOLAX/MIRALAX) 17 GM/SCOOP powder Take 17 g by mouth daily as needed for mild constipation.  500 g 0   rosuvastatin (CRESTOR) 10 MG tablet Take 1 tablet (10 mg total) by mouth daily. 90 tablet 3   TRUEplus Lancets 30G MISC Use to check glucose once daily. 100 each 1   No current facility-administered medications on file prior to visit.     ROS see history of present illness  Objective  Physical Exam Vitals:   06/06/22 1356  BP: 128/82  Pulse: 81  Temp: 97.7 F (36.5 C)  SpO2: 96%    BP Readings from Last 3 Encounters:  06/06/22 128/82  06/06/22 120/80  05/09/22 124/62   Wt Readings from Last 3 Encounters:  06/06/22 222 lb 9.6 oz (101 kg)  06/06/22 224 lb 12.8 oz (102 kg)  05/09/22 221 lb (100.2 kg)    Physical Exam Constitutional:      General: She is not in acute distress.    Appearance: She is not diaphoretic.   Cardiovascular:     Rate and Rhythm: Normal rate and regular rhythm.     Heart sounds: Normal heart sounds.  Pulmonary:     Effort: Pulmonary effort is normal.     Breath sounds: Normal breath sounds.  Musculoskeletal:     Right lower leg: No edema.     Left lower leg: No edema.  Skin:    General: Skin is warm and dry.  Neurological:     Mental Status: She is alert.      Assessment/Plan: Please see individual problem list.  Primary hypertension Assessment & Plan: Chronic issue.  Diastolic blood pressure ranges intermittently above goal though she is not checking her blood pressure frequently.  I have asked her to check it on a daily basis.  Her goal is less than 130/80.  She will continue metoprolol 25 mg twice daily, losartan 100 mg daily, and amlodipine 5 mg daily.   OSA on CPAP Assessment & Plan: Chronic issue.  Requesting a compliance report given her hypersomnia.  She will continue her CPAP.   Diabetes mellitus type 2 in obese [facility]) Assessment & Plan: Chronic issue.  Check A1c.  Increase mounjaro to 10 mg weekly for weight loss benefits.  Continue Jardiance 25 mg daily and metformin 500 mg twice daily.  Orders: -     Tirzepatide; Inject 10 mg into the skin once a week.  Dispense: 6 mL; Refill: 2 -     Hemoglobin A1c -     Lipid panel  B12 deficiency Assessment & Plan: Chronic issue.  Continue every 2-week B12 injections.  Check B12 today.  Orders: -     Vitamin B12     Return in about 3 months (around 09/06/2022).   Tommi Rumps, MD [place]

## 2022-06-07 LAB — LIPID PANEL
Chol/HDL Ratio: 2.3 ratio (ref 0.0–4.4)
Cholesterol, Total: 107 mg/dL (ref 100–199)
HDL: 46 mg/dL (ref >39)
LDL Chol Calc (NIH): 36 mg/dL (ref 0–99)
Triglycerides: 146 mg/dL (ref 0–149)
VLDL Cholesterol Cal: 25 mg/dL (ref 5–40)

## 2022-06-07 LAB — HEMOGLOBIN A1C
Est. average glucose Bld gHb Est-mCnc: 131 mg/dL
Hgb A1c MFr Bld: 6.2 % — ABNORMAL HIGH (ref 4.8–5.6)

## 2022-06-07 LAB — VITAMIN B12: Vitamin B-12: 983 pg/mL (ref 232–1245)

## 2022-06-10 DIAGNOSIS — Z1231 Encounter for screening mammogram for malignant neoplasm of breast: Secondary | ICD-10-CM | POA: Diagnosis not present

## 2022-06-12 ENCOUNTER — Ambulatory Visit (INDEPENDENT_AMBULATORY_CARE_PROVIDER_SITE_OTHER): Payer: Medicare HMO

## 2022-06-12 DIAGNOSIS — E538 Deficiency of other specified B group vitamins: Secondary | ICD-10-CM

## 2022-06-12 MED ORDER — CYANOCOBALAMIN 1000 MCG/ML IJ SOLN
1000.0000 ug | Freq: Once | INTRAMUSCULAR | Status: AC
  Administered 2022-06-12: 1000 ug via INTRAMUSCULAR

## 2022-06-12 NOTE — Progress Notes (Signed)
presents today for injection per MD orders. B12 injection administered IM in Right Upper Arm. Administration without incident. Patient tolerated well.  Gabriell Casimir,cma  

## 2022-06-26 ENCOUNTER — Ambulatory Visit (INDEPENDENT_AMBULATORY_CARE_PROVIDER_SITE_OTHER): Payer: Medicare HMO

## 2022-06-26 DIAGNOSIS — E538 Deficiency of other specified B group vitamins: Secondary | ICD-10-CM | POA: Diagnosis not present

## 2022-06-26 MED ORDER — CYANOCOBALAMIN 1000 MCG/ML IJ SOLN
1000.0000 ug | Freq: Once | INTRAMUSCULAR | Status: AC
  Administered 2022-06-26: 1000 ug via INTRAMUSCULAR

## 2022-06-26 NOTE — Progress Notes (Signed)
Patient presented for B 12 injection to left deltoid, patient voiced no concerns nor showed any signs of distress during injection. 

## 2022-07-09 ENCOUNTER — Other Ambulatory Visit: Payer: Self-pay | Admitting: Cardiovascular Disease

## 2022-07-10 ENCOUNTER — Ambulatory Visit (INDEPENDENT_AMBULATORY_CARE_PROVIDER_SITE_OTHER): Payer: Medicare HMO

## 2022-07-10 DIAGNOSIS — E538 Deficiency of other specified B group vitamins: Secondary | ICD-10-CM | POA: Diagnosis not present

## 2022-07-10 MED ORDER — CYANOCOBALAMIN 1000 MCG/ML IJ SOLN
1000.0000 ug | Freq: Once | INTRAMUSCULAR | Status: AC
  Administered 2022-07-10: 1000 ug via INTRAMUSCULAR

## 2022-07-10 NOTE — Progress Notes (Signed)
After obtaining consent, and per orders of Dr. Clent Ridges, injection of B-12 given IM in right Deltoid by Valentino Nose. Patient tolerated injection well.

## 2022-07-25 ENCOUNTER — Other Ambulatory Visit: Payer: Self-pay | Admitting: Cardiovascular Disease

## 2022-07-25 ENCOUNTER — Other Ambulatory Visit: Payer: Self-pay | Admitting: Family Medicine

## 2022-07-25 DIAGNOSIS — Z800 Family history of malignant neoplasm of digestive organs: Secondary | ICD-10-CM | POA: Diagnosis not present

## 2022-07-25 DIAGNOSIS — R1310 Dysphagia, unspecified: Secondary | ICD-10-CM | POA: Diagnosis not present

## 2022-07-25 DIAGNOSIS — Z79899 Other long term (current) drug therapy: Secondary | ICD-10-CM | POA: Diagnosis not present

## 2022-07-25 DIAGNOSIS — Z86010 Personal history of colonic polyps: Secondary | ICD-10-CM | POA: Diagnosis not present

## 2022-07-25 DIAGNOSIS — K5900 Constipation, unspecified: Secondary | ICD-10-CM | POA: Diagnosis not present

## 2022-07-28 ENCOUNTER — Ambulatory Visit (INDEPENDENT_AMBULATORY_CARE_PROVIDER_SITE_OTHER): Payer: Medicare HMO

## 2022-07-28 DIAGNOSIS — E538 Deficiency of other specified B group vitamins: Secondary | ICD-10-CM | POA: Diagnosis not present

## 2022-07-28 MED ORDER — CYANOCOBALAMIN 1000 MCG/ML IJ SOLN
1000.0000 ug | Freq: Once | INTRAMUSCULAR | Status: AC
Start: 2022-07-28 — End: 2022-07-28
  Administered 2022-07-28: 1000 ug via INTRAMUSCULAR

## 2022-07-28 NOTE — Progress Notes (Signed)
Pt presented for their vitamin B12 injection. Pt was identified through two identifiers. Pt tolerated shot well in their left  deltoid.  

## 2022-08-14 ENCOUNTER — Ambulatory Visit (INDEPENDENT_AMBULATORY_CARE_PROVIDER_SITE_OTHER): Payer: Medicare HMO

## 2022-08-14 DIAGNOSIS — E538 Deficiency of other specified B group vitamins: Secondary | ICD-10-CM

## 2022-08-14 DIAGNOSIS — M170 Bilateral primary osteoarthritis of knee: Secondary | ICD-10-CM | POA: Diagnosis not present

## 2022-08-14 MED ORDER — CYANOCOBALAMIN 1000 MCG/ML IJ SOLN
1000.0000 ug | Freq: Once | INTRAMUSCULAR | Status: AC
Start: 2022-08-14 — End: 2022-08-14
  Administered 2022-08-14: 1000 ug via INTRAMUSCULAR

## 2022-08-14 NOTE — Progress Notes (Signed)
Patient arrived for a B12 injection and it was administered into her right deltoid. Patient tolerated the injection well and did not show any signs of distress or voice any concerns. 

## 2022-08-22 ENCOUNTER — Telehealth: Payer: Self-pay | Admitting: Family Medicine

## 2022-08-22 DIAGNOSIS — E876 Hypokalemia: Secondary | ICD-10-CM

## 2022-08-28 ENCOUNTER — Ambulatory Visit (INDEPENDENT_AMBULATORY_CARE_PROVIDER_SITE_OTHER): Payer: Medicare HMO

## 2022-08-28 DIAGNOSIS — E538 Deficiency of other specified B group vitamins: Secondary | ICD-10-CM | POA: Diagnosis not present

## 2022-08-28 MED ORDER — CYANOCOBALAMIN 1000 MCG/ML IJ SOLN
1000.0000 ug | Freq: Once | INTRAMUSCULAR | Status: AC
Start: 2022-08-28 — End: 2022-08-28
  Administered 2022-08-28: 1000 ug via INTRAMUSCULAR

## 2022-08-28 NOTE — Progress Notes (Signed)
Patient arrived for a B12 injection and it was administered into her left deltoid. Patient tolerated the injection well and did not show any signs of distress or voice any concerns. 

## 2022-09-08 ENCOUNTER — Encounter: Payer: Self-pay | Admitting: Cardiovascular Disease

## 2022-09-08 ENCOUNTER — Ambulatory Visit: Payer: Medicare HMO | Admitting: Cardiovascular Disease

## 2022-09-08 VITALS — BP 120/80 | HR 69 | Ht 63.0 in | Wt 207.6 lb

## 2022-09-08 DIAGNOSIS — R0602 Shortness of breath: Secondary | ICD-10-CM

## 2022-09-08 DIAGNOSIS — K219 Gastro-esophageal reflux disease without esophagitis: Secondary | ICD-10-CM | POA: Diagnosis not present

## 2022-09-08 DIAGNOSIS — R002 Palpitations: Secondary | ICD-10-CM | POA: Diagnosis not present

## 2022-09-08 DIAGNOSIS — I10 Essential (primary) hypertension: Secondary | ICD-10-CM | POA: Diagnosis not present

## 2022-09-08 DIAGNOSIS — G4733 Obstructive sleep apnea (adult) (pediatric): Secondary | ICD-10-CM

## 2022-09-08 NOTE — Progress Notes (Signed)
Cardiology Office Note   Date:  09/08/2022   ID:  Shelby Estes, DOB Aug 09, 1955, MRN [id number]  PCP:  Glori Luis, MD  Cardiologist:  Adrian Blackwater, MD      History of Present Illness: Shelby Estes is a 67 y.o. female who presents for  Chief Complaint  Patient presents with   Follow-up    3 mo F/U    Doing well      Past Medical History:  Diagnosis Date   Anxiety    Depression    Diabetes mellitus 2010   Elevated BP    GERD (gastroesophageal reflux disease)    Hypertension    Kidney stone    Dr. Dallas Schimke, April 2014   Renal cyst    Dr. Dallas Schimke   SVT (supraventricular tachycardia)    s/p cardioversion   Vitamin D deficiency      Past Surgical History:  Procedure Laterality Date   ABDOMINAL HYSTERECTOMY  09/27/13   Dr. Veatrice Kells   Arm Surgery     due to fracture left elbow   CHOLECYSTECTOMY  5/07   Dr Lemar Livings   COLONOSCOPY  2007   Dr Lemar Livings   COLONOSCOPY WITH PROPOFOL N/A 06/20/2015   Procedure: COLONOSCOPY WITH PROPOFOL;  Surgeon: Earline Mayotte, MD;  Location[facility] ENDOSCOPY;  Service: Endoscopy;  Laterality: N/A;   TUBAL LIGATION  02-21-01     Current Outpatient Medications  Medication Sig Dispense Refill   ADULT ASPIRIN REGIMEN 81 MG EC tablet      Alcohol Swabs (B-D SINGLE USE SWABS REGULAR) PADS Use daily to clean skin prior to testing/injection 100 each 3   amLODipine (NORVASC) 5 MG tablet TAKE ONE TABLET BY MOUTH DAILY 90 tablet 2   Blood Glucose Monitoring Suppl (TRUE METRIX AIR GLUCOSE METER) w/Device KIT 1 each by Does not apply route daily. 1 kit 0   Cholecalciferol (VITAMIN D) 2000 UNITS CAPS Take 1 capsule by mouth daily.     colchicine 0.6 MG tablet Take 0.6 mg by mouth 2 (two) times daily.     DULoxetine (CYMBALTA) 30 MG capsule TAKE 2 CAPSULES BY MOUTH EVERY DAY AS DIRECTED 180 capsule 1   fluticasone (FLONASE) 50 MCG/ACT nasal spray Place 2 sprays into both nostrils daily. 16 g 6   glucose  blood (GNP TRUE METRIX GLUCOSE STRIPS) test strip Check once daily. 100 each 12   JARDIANCE 25 MG TABS tablet TAKE ONE TABLET EACH MORNING BEFORE BREAKFAST 90 tablet 1   losartan (COZAAR) 100 MG tablet TAKE ONE TABLET BY MOUTH EVERY DAY ALONGWITH HCTZ 90 tablet 1   meloxicam (MOBIC) 15 MG tablet Take 15 mg by mouth daily.      metFORMIN (GLUCOPHAGE) 1000 MG tablet TAKE 1/2 TABLET BY MOUTH TWICE DAILY WITH A MEAL 180 tablet 1   metoprolol tartrate (LOPRESSOR) 25 MG tablet TAKE 1 TABLET BY MOUTH TWICE DAILY 180 tablet 0   pantoprazole (PROTONIX) 40 MG tablet TAKE ONE TABLET BY MOUTH TWICE DAILY BEFORE MEALS 60 tablet 3   polyethylene glycol powder (GLYCOLAX/MIRALAX) 17 GM/SCOOP powder Take 17 g by mouth daily as needed for mild constipation. 500 g 0   rosuvastatin (CRESTOR) 10 MG tablet Take 1 tablet (10 mg total) by mouth daily. 90 tablet 3   tirzepatide (MOUNJARO) 10 MG/0.5ML Pen Inject 10 mg into the skin once a week. 6 mL 2   TRUEplus Lancets 30G MISC Use to check glucose once daily. 100 each 1   No  current facility-administered medications for this visit.    Allergies:   Bupropion, Clarithromycin, Enalapril, Phentermine, Ceclor [cefaclor], Penicillins, and Sulfa antibiotics    Social History:   reports that she has never smoked. She has never used smokeless tobacco. She reports that she does not drink alcohol and does not use drugs.   Family History:  family history includes Breast cancer in her maternal aunt and another family member; COPD in her father; Cancer in an other family member; Colon cancer in her cousin and sister; Diabetes in her mother; Hypertension in her mother; Seizures in her maternal grandmother; Stroke in her father; Thyroid disease in her mother.    ROS:     Review of Systems  Constitutional: Negative.   HENT: Negative.    Eyes: Negative.   Respiratory: Negative.    Gastrointestinal: Negative.   Genitourinary: Negative.   Musculoskeletal: Negative.   Skin:  Negative.   Neurological: Negative.   Endo/Heme/Allergies: Negative.   Psychiatric/Behavioral: Negative.    All other systems reviewed and are negative.     All other systems are reviewed and negative.    PHYSICAL EXAM: VS:  BP 120/80   Pulse 69   Ht 5\' 3"  (1.6 m)   Wt 207 lb 9.6 oz (94.2 kg)   SpO2 97%   BMI 36.77 kg/m  , BMI Body mass index is 36.77 kg/m. Last weight:  Wt Readings from Last 3 Encounters:  09/08/22 207 lb 9.6 oz (94.2 kg)  06/06/22 222 lb 9.6 oz (101 kg)  06/06/22 224 lb 12.8 oz (102 kg)     Physical Exam Constitutional:      Appearance: Normal appearance.  Cardiovascular:     Rate and Rhythm: Normal rate and regular rhythm.     Heart sounds: Normal heart sounds.  Pulmonary:     Effort: Pulmonary effort is normal.     Breath sounds: Normal breath sounds.  Musculoskeletal:     Right lower leg: No edema.     Left lower leg: No edema.  Neurological:     Mental Status: She is alert.       EKG:   Recent Labs: 02/17/2022: Hemoglobin 12.6; Platelets 499.0    Lipid Panel    Component Value Date/Time   CHOL 107 06/06/2022 1402   TRIG 146 06/06/2022 1402   HDL 46 06/06/2022 1402   CHOLHDL 2.3 06/06/2022 1402   CHOLHDL 3 05/03/2021 1037   VLDL 43.4 (H) 05/03/2021 1037   LDLCALC 36 06/06/2022 1402   LDLDIRECT 50.0 05/03/2021 1037      Other studies Reviewed: Additional studies/ records that were reviewed today include:  Review of the above records demonstrates:       No data to display            ASSESSMENT AND PLAN:    ICD-10-CM   1. Primary hypertension  I10    stable    2. Gastroesophageal reflux disease, unspecified whether esophagitis present  K21.9     3. Palpitations  R00.2    No further palpitation    4. SOB (shortness of breath)  R06.02     5. Obstructive sleep apnea syndrome  G47.33    compliant and doing well       Problem List Items Addressed This Visit       Cardiovascular and Mediastinum    Hypertension - Primary (Chronic)     Digestive   GERD (gastroesophageal reflux disease)     Other   Palpitations  Other Visit Diagnoses     SOB (shortness of breath)       Obstructive sleep apnea syndrome       compliant and doing well          Disposition:   Return in about 3 months (around 12/09/2022).    Total time spent: 30 minutes  Signed,  Adrian Blackwater, MD  09/08/2022 10:32 AM    [facility]

## 2022-09-10 ENCOUNTER — Ambulatory Visit (INDEPENDENT_AMBULATORY_CARE_PROVIDER_SITE_OTHER): Payer: Medicare HMO | Admitting: Family Medicine

## 2022-09-10 ENCOUNTER — Encounter: Payer: Self-pay | Admitting: Family Medicine

## 2022-09-10 VITALS — BP 122/74 | HR 85 | Temp 97.8°F | Ht 63.0 in | Wt 206.4 lb

## 2022-09-10 DIAGNOSIS — K219 Gastro-esophageal reflux disease without esophagitis: Secondary | ICD-10-CM

## 2022-09-10 DIAGNOSIS — E1169 Type 2 diabetes mellitus with other specified complication: Secondary | ICD-10-CM

## 2022-09-10 DIAGNOSIS — R0781 Pleurodynia: Secondary | ICD-10-CM

## 2022-09-10 DIAGNOSIS — E669 Obesity, unspecified: Secondary | ICD-10-CM | POA: Diagnosis not present

## 2022-09-10 DIAGNOSIS — F32A Depression, unspecified: Secondary | ICD-10-CM

## 2022-09-10 DIAGNOSIS — F419 Anxiety disorder, unspecified: Secondary | ICD-10-CM | POA: Diagnosis not present

## 2022-09-10 DIAGNOSIS — D72829 Elevated white blood cell count, unspecified: Secondary | ICD-10-CM | POA: Diagnosis not present

## 2022-09-10 DIAGNOSIS — Z6835 Body mass index (BMI) 35.0-35.9, adult: Secondary | ICD-10-CM

## 2022-09-10 DIAGNOSIS — J309 Allergic rhinitis, unspecified: Secondary | ICD-10-CM

## 2022-09-10 DIAGNOSIS — Z7984 Long term (current) use of oral hypoglycemic drugs: Secondary | ICD-10-CM

## 2022-09-10 LAB — CBC
HCT: 38.7 % (ref 36.0–46.0)
Hemoglobin: 12.5 g/dL (ref 12.0–15.0)
MCHC: 32.1 g/dL (ref 30.0–36.0)
MCV: 86.2 fl (ref 78.0–100.0)
Platelets: 466 10*3/uL — ABNORMAL HIGH (ref 150.0–400.0)
RBC: 4.49 Mil/uL (ref 3.87–5.11)
RDW: 15.4 % (ref 11.5–15.5)
WBC: 11.5 10*3/uL — ABNORMAL HIGH (ref 4.0–10.5)

## 2022-09-10 LAB — COMPREHENSIVE METABOLIC PANEL
ALT: 8 U/L (ref 0–35)
AST: 11 U/L (ref 0–37)
Albumin: 4.3 g/dL (ref 3.5–5.2)
Alkaline Phosphatase: 104 U/L (ref 39–117)
BUN: 13 mg/dL (ref 6–23)
CO2: 26 mEq/L (ref 19–32)
Calcium: 9.8 mg/dL (ref 8.4–10.5)
Chloride: 106 mEq/L (ref 96–112)
Creatinine, Ser: 0.9 mg/dL (ref 0.40–1.20)
GFR: 66.62 mL/min (ref >60.00)
Glucose, Bld: 100 mg/dL — ABNORMAL HIGH (ref 70–99)
Potassium: 3.2 mEq/L — ABNORMAL LOW (ref 3.5–5.1)
Sodium: 141 mEq/L (ref 135–145)
Total Bilirubin: 0.7 mg/dL (ref 0.2–1.2)
Total Protein: 6.9 g/dL (ref 6.0–8.3)

## 2022-09-10 LAB — HEMOGLOBIN A1C: Hgb A1c MFr Bld: 5.9 % (ref 4.6–6.5)

## 2022-09-10 NOTE — Assessment & Plan Note (Signed)
>>  ASSESSMENT AND PLAN FOR ANXIETY AND DEPRESSION WRITTEN ON 09/10/2022 10:06 AM BY MARIBETH, ERIC G, MD  Chronic issue.  Worsened recently.  Patient would like to remain on Cymbalta  60 mg daily.  If she has any worsening symptoms she will let us  know.

## 2022-09-10 NOTE — Assessment & Plan Note (Signed)
Chronic issue.  Worsened recently.  Patient would like to remain on Cymbalta 60 mg daily.  If she has any worsening symptoms she will let us know.

## 2022-09-10 NOTE — Assessment & Plan Note (Signed)
Chronic issue.  Encouraged healthy diet.  Discussed trying to remain as active as she is able to.

## 2022-09-10 NOTE — Assessment & Plan Note (Signed)
Likely related to muscular strain.  Discussed this would improve with time.  She will monitor.

## 2022-09-10 NOTE — Assessment & Plan Note (Signed)
Chronic issue.  Adequately controlled.  She will continue Protonix 40 mg 1-2 times daily before meals.

## 2022-09-10 NOTE — Assessment & Plan Note (Signed)
Chronic issue.  Worsened recently.  Discussed she could try Claritin, Zyrtec, or Flonase over-the-counter.

## 2022-09-10 NOTE — Progress Notes (Signed)
Marikay Alar, MD Phone: [phone]  Shelby Estes is a 67 y.o. female who presents today for f/u.  DIABETES Disease Monitoring: Blood Sugar ranges-88-125 Polyuria/phagia/dipsia- no      Optho- due Medications: Compliance- taking jardiance, metformin, mounjaro Hypoglycemic symptoms- no  GERD:   Reflux symptoms: rarely has symptoms  Abd pain: no   Blood in stool: no  Dysphagia: no   EGD: none  Medication: taking protonix 1-2x/day  Anxiety/depression: Patient notes this is somewhat worse.  She feels like the Cymbalta is doing a pretty good job for her though.  Her husband had shoulder surgery twice recently and he is not handling it very well.  He gets angry relatively easily.  This has not been easy for the patient.  She notes that is what has been causing her worsened symptoms.  She notes no SI.  She notes she feels safe at home.  Allergic rhinitis: Patient notes she worked outside and with the dry grass got some postnasal drip, sneezing, and scratchy throat.  She has not been taking anything for this.  Rib soreness: Patient notes rib soreness bilaterally recently.  Notes it hurts her if she turns wrong.  Bothers her more when she gets up in the morning.  She is not sure if she did anything to injure this.  Obesity: Patient has been doing more activity outside.  She notes her chronic knee pain limits her ability to exercise.  She typically has a granola bar in the morning and then will have chicken and potatoes or a salad for lunch.  She will have a snack for dinner.  Social History   Tobacco Use  Smoking Status Never  Smokeless Tobacco Never    Current Outpatient Medications on File Prior to Visit  Medication Sig Dispense Refill   ADULT ASPIRIN REGIMEN 81 MG EC tablet      Alcohol Swabs (B-D SINGLE USE SWABS REGULAR) PADS Use daily to clean skin prior to testing/injection 100 each 3   amLODipine (NORVASC) 5 MG tablet TAKE ONE TABLET BY MOUTH DAILY 90 tablet 2   Blood  Glucose Monitoring Suppl (TRUE METRIX AIR GLUCOSE METER) w/Device KIT 1 each by Does not apply route daily. 1 kit 0   Cholecalciferol (VITAMIN D) 2000 UNITS CAPS Take 1 capsule by mouth daily.     colchicine 0.6 MG tablet Take 0.6 mg by mouth 2 (two) times daily.     DULoxetine (CYMBALTA) 30 MG capsule TAKE 2 CAPSULES BY MOUTH EVERY DAY AS DIRECTED 180 capsule 1   fluticasone (FLONASE) 50 MCG/ACT nasal spray Place 2 sprays into both nostrils daily. 16 g 6   glucose blood (GNP TRUE METRIX GLUCOSE STRIPS) test strip Check once daily. 100 each 12   JARDIANCE 25 MG TABS tablet TAKE ONE TABLET EACH MORNING BEFORE BREAKFAST 90 tablet 1   losartan (COZAAR) 100 MG tablet TAKE ONE TABLET BY MOUTH EVERY DAY ALONGWITH HCTZ 90 tablet 1   meloxicam (MOBIC) 15 MG tablet Take 15 mg by mouth daily.      metFORMIN (GLUCOPHAGE) 1000 MG tablet TAKE 1/2 TABLET BY MOUTH TWICE DAILY WITH A MEAL 180 tablet 1   metoprolol tartrate (LOPRESSOR) 25 MG tablet TAKE 1 TABLET BY MOUTH TWICE DAILY 180 tablet 0   pantoprazole (PROTONIX) 40 MG tablet TAKE ONE TABLET BY MOUTH TWICE DAILY BEFORE MEALS 60 tablet 3   polyethylene glycol powder (GLYCOLAX/MIRALAX) 17 GM/SCOOP powder Take 17 g by mouth daily as needed for mild constipation. 500 g 0  rosuvastatin (CRESTOR) 10 MG tablet Take 1 tablet (10 mg total) by mouth daily. 90 tablet 3   tirzepatide (MOUNJARO) 10 MG/0.5ML Pen Inject 10 mg into the skin once a week. 6 mL 2   TRUEplus Lancets 30G MISC Use to check glucose once daily. 100 each 1   No current facility-administered medications on file prior to visit.     ROS see history of present illness  Objective  Physical Exam Vitals:   09/10/22 0950  BP: 122/74  Pulse: 85  Temp: 97.8 F (36.6 C)  SpO2: 98%    BP Readings from Last 3 Encounters:  09/10/22 122/74  09/08/22 120/80  06/06/22 128/82   Wt Readings from Last 3 Encounters:  09/10/22 206 lb 6.4 oz (93.6 kg)  09/08/22 207 lb 9.6 oz (94.2 kg)  06/06/22  222 lb 9.6 oz (101 kg)    Physical Exam Constitutional:      General: She is not in acute distress.    Appearance: She is not diaphoretic.  Cardiovascular:     Rate and Rhythm: Normal rate and regular rhythm.     Heart sounds: Normal heart sounds.  Pulmonary:     Effort: Pulmonary effort is normal.     Breath sounds: Normal breath sounds.  Skin:    General: Skin is warm and dry.  Neurological:     Mental Status: She is alert.    Diabetic Foot Exam - Simple   Simple Foot Form Diabetic Foot exam was performed with the following findings: Yes 09/10/2022 10:03 AM  Visual Inspection No deformities, no ulcerations, no other skin breakdown bilaterally: Yes Sensation Testing Intact to touch and monofilament testing bilaterally: Yes Pulse Check Posterior Tibialis and Dorsalis pulse intact bilaterally: Yes Comments      Assessment/Plan: Please see individual problem list.  Type 2 diabetes mellitus with obesity (HCC) Assessment & Plan: Chronic issue.  Check A1c.  Continue Mounjaro 10 mg daily, metformin 500 mg twice daily, and Jardiance 25 mg daily.  Orders: -     Comprehensive metabolic panel -     Hemoglobin A1c  Allergic rhinitis, unspecified seasonality, unspecified trigger Assessment & Plan: Chronic issue.  Worsened recently.  Discussed she could try Claritin, Zyrtec, or Flonase over-the-counter.   Gastroesophageal reflux disease, unspecified whether esophagitis present Assessment & Plan: Chronic issue.  Adequately controlled.  She will continue Protonix 40 mg 1-2 times daily before meals.   Severe obesity (BMI 35.0-35.9 with comorbidity) (HCC) Assessment & Plan: Chronic issue.  Encouraged healthy diet.  Discussed trying to remain as active as she is able to.   Anxiety and depression Assessment & Plan: Chronic issue.  Worsened recently.  Patient would like to remain on Cymbalta 60 mg daily.  If she has any worsening symptoms she will let us know.   Rib  pain Assessment & Plan: Likely related to muscular strain.  Discussed this would improve with time.  She will monitor.   Leukocytosis, unspecified type -     CBC     Health Maintenance: Patient was encouraged to see her eye doctor for her yearly eye exam.  Return in about 6 months (around 03/12/2023).   Marikay Alar, MD [facility]

## 2022-09-10 NOTE — Assessment & Plan Note (Signed)
Chronic issue.  Check A1c.  Continue Mounjaro 10 mg daily, metformin 500 mg twice daily, and Jardiance 25 mg daily.

## 2022-09-11 ENCOUNTER — Ambulatory Visit (INDEPENDENT_AMBULATORY_CARE_PROVIDER_SITE_OTHER): Payer: Medicare HMO | Admitting: *Deleted

## 2022-09-11 DIAGNOSIS — E538 Deficiency of other specified B group vitamins: Secondary | ICD-10-CM | POA: Diagnosis not present

## 2022-09-11 MED ORDER — CYANOCOBALAMIN 1000 MCG/ML IJ SOLN
1000.0000 ug | Freq: Once | INTRAMUSCULAR | Status: AC
Start: 2022-09-11 — End: 2022-09-11
  Administered 2022-09-11: 1000 ug via INTRAMUSCULAR

## 2022-09-11 NOTE — Progress Notes (Signed)
Pt received B12 injection in right deltoid muscle. Pt tolerated it well with no complaints or concerns.  

## 2022-09-12 MED ORDER — POTASSIUM CHLORIDE CRYS ER 20 MEQ PO TBCR
40.0000 meq | EXTENDED_RELEASE_TABLET | Freq: Every day | ORAL | 0 refills | Status: DC
Start: 2022-09-12 — End: 2023-08-11

## 2022-09-12 NOTE — Telephone Encounter (Signed)
Left message to call the office back regarding her lab results 

## 2022-09-12 NOTE — Addendum Note (Signed)
Addended by: Glori Luis on: 09/12/2022 02:20 PM   Modules accepted: Orders

## 2022-09-12 NOTE — Telephone Encounter (Signed)
Patient states she is returning our call.  I read message from Dr. Marikay Alar to patient.  Patient states she would like for Dr. Birdie Sons to please send her prescription to [facility].  Patient is coming in for a B12 injection on 09/25/2022 and would like to have her labs drawn at that time.  I added labs to the appointment note for her nurse visit on 09/25/2022.

## 2022-09-12 NOTE — Telephone Encounter (Signed)
Sent to pharmacy. Labs ordered.  

## 2022-09-12 NOTE — Telephone Encounter (Signed)
-----   Message from Eric G Sonnenberg, MD sent at 09/12/2022 10:55 AM EDT ----- Please let the patient know that her potassium is mildly low.  I would like for her to take a potassium supplement that I can send to the pharmacy once you speak with her.  She should have her potassium rechecked in 1 week.  Her white blood cell count and platelets are generally stable.  Will continue to monitor those. 

## 2022-09-15 ENCOUNTER — Telehealth: Payer: Self-pay

## 2022-09-15 NOTE — Telephone Encounter (Signed)
Left message to call the office back regarding her lab results 

## 2022-09-15 NOTE — Telephone Encounter (Signed)
-----   Message from Glori Luis, MD sent at 09/12/2022 10:55 AM EDT ----- Please let the patient know that her potassium is mildly low.  I would like for her to take a potassium supplement that I can send to the pharmacy once you speak with her.  She should have her potassium rechecked in 1 week.  Her white blood cell count and platelets are generally stable.  Will continue to monitor those.

## 2022-09-25 ENCOUNTER — Ambulatory Visit (INDEPENDENT_AMBULATORY_CARE_PROVIDER_SITE_OTHER): Payer: Medicare HMO

## 2022-09-25 DIAGNOSIS — E876 Hypokalemia: Secondary | ICD-10-CM

## 2022-09-25 DIAGNOSIS — E538 Deficiency of other specified B group vitamins: Secondary | ICD-10-CM

## 2022-09-25 LAB — POTASSIUM: Potassium: 4.4 mEq/L (ref 3.5–5.1)

## 2022-09-25 MED ORDER — CYANOCOBALAMIN 1000 MCG/ML IJ SOLN
1000.0000 ug | Freq: Once | INTRAMUSCULAR | Status: AC
Start: 2022-09-25 — End: 2022-09-25
  Administered 2022-09-25: 1000 ug via INTRAMUSCULAR

## 2022-09-25 NOTE — Progress Notes (Signed)
After obtaining consent, and per orders of Kacy Lester, NP, injection of B-12 given IM in left deltoid by Grayer Sproles Lynn. Patient tolerated injection well.   

## 2022-10-03 ENCOUNTER — Other Ambulatory Visit: Payer: Self-pay | Admitting: Cardiovascular Disease

## 2022-10-09 ENCOUNTER — Ambulatory Visit (INDEPENDENT_AMBULATORY_CARE_PROVIDER_SITE_OTHER): Payer: Medicare HMO

## 2022-10-09 DIAGNOSIS — E538 Deficiency of other specified B group vitamins: Secondary | ICD-10-CM | POA: Diagnosis not present

## 2022-10-09 MED ORDER — CYANOCOBALAMIN 1000 MCG/ML IJ SOLN
1000.0000 ug | Freq: Once | INTRAMUSCULAR | Status: AC
Start: 2022-10-09 — End: 2022-10-09
  Administered 2022-10-09: 1000 ug via INTRAMUSCULAR

## 2022-10-09 NOTE — Progress Notes (Signed)
Patient arrived for a B12 injection and it was administered into her right deltoid. Patient tolerated the injection well and did not show any signs of distress or voice any concerns. 

## 2022-10-16 DIAGNOSIS — H2513 Age-related nuclear cataract, bilateral: Secondary | ICD-10-CM | POA: Diagnosis not present

## 2022-10-16 DIAGNOSIS — D3132 Benign neoplasm of left choroid: Secondary | ICD-10-CM | POA: Diagnosis not present

## 2022-10-16 DIAGNOSIS — E119 Type 2 diabetes mellitus without complications: Secondary | ICD-10-CM | POA: Diagnosis not present

## 2022-10-16 LAB — HM DIABETES EYE EXAM

## 2022-10-20 ENCOUNTER — Other Ambulatory Visit: Payer: Self-pay | Admitting: Family Medicine

## 2022-10-20 NOTE — Telephone Encounter (Signed)
Left message to return call to our office.  

## 2022-10-20 NOTE — Telephone Encounter (Signed)
Patient returned office phone call. 

## 2022-10-20 NOTE — Telephone Encounter (Signed)
Patient states she is returning a call from [employer], CMA.  I was unable to reach [place] at the time of the call so I let patient know that I will send her a message so Tresa Endo can call her back.

## 2022-10-22 ENCOUNTER — Other Ambulatory Visit: Payer: Self-pay

## 2022-10-22 ENCOUNTER — Telehealth: Payer: Self-pay

## 2022-10-22 MED ORDER — TRUEPLUS LANCETS 30G MISC: 3 refills | Status: AC

## 2022-10-22 MED ORDER — TRUEPLUS LANCETS 30G MISC: 3 refills | Status: DC

## 2022-10-22 NOTE — Telephone Encounter (Signed)
Called Patient to clarify the instructions and the lancets were sent to the wrong Pharmacy so I called and cancelled them then reordered the lancets at [facility].

## 2022-10-22 NOTE — Telephone Encounter (Signed)
Pt called in and I spoke to her about the rx for lancets was sent in and she stated that she checks her blood sugar 2x a day.

## 2022-10-22 NOTE — Telephone Encounter (Signed)
Instructions changed

## 2022-10-22 NOTE — Addendum Note (Signed)
Addended by: Birdie Sons  G on: 10/22/2022 12:56 PM   Modules accepted: Orders

## 2022-10-23 ENCOUNTER — Ambulatory Visit (INDEPENDENT_AMBULATORY_CARE_PROVIDER_SITE_OTHER): Payer: Medicare HMO

## 2022-10-23 DIAGNOSIS — E538 Deficiency of other specified B group vitamins: Secondary | ICD-10-CM

## 2022-10-23 MED ORDER — CYANOCOBALAMIN 1000 MCG/ML IJ SOLN
1000.0000 ug | Freq: Once | INTRAMUSCULAR | Status: AC
Start: 2022-10-23 — End: 2022-10-23
  Administered 2022-10-23: 1000 ug via INTRAMUSCULAR

## 2022-10-23 NOTE — Progress Notes (Signed)
Per Dr. Clent Ridges a B12 injection was administered into her left deltoid. Patient tolerated the injection well.

## 2022-10-29 ENCOUNTER — Other Ambulatory Visit: Payer: Self-pay | Admitting: Family Medicine

## 2022-11-06 ENCOUNTER — Ambulatory Visit (INDEPENDENT_AMBULATORY_CARE_PROVIDER_SITE_OTHER): Payer: Medicare HMO

## 2022-11-06 DIAGNOSIS — E538 Deficiency of other specified B group vitamins: Secondary | ICD-10-CM | POA: Diagnosis not present

## 2022-11-06 MED ORDER — CYANOCOBALAMIN 1000 MCG/ML IJ SOLN
1000.0000 ug | Freq: Once | INTRAMUSCULAR | Status: AC
Start: 2022-11-06 — End: 2022-11-06
  Administered 2022-11-06: 1000 ug via INTRAMUSCULAR

## 2022-11-06 NOTE — Progress Notes (Signed)
Patient arrived for a B12 injection and it was administered into her right deltoid. Patient tolerated the injection well and did not show any signs of distress or voice any concerns. 

## 2022-11-20 ENCOUNTER — Ambulatory Visit: Payer: Medicare HMO

## 2022-11-24 ENCOUNTER — Ambulatory Visit (INDEPENDENT_AMBULATORY_CARE_PROVIDER_SITE_OTHER): Payer: Medicare HMO

## 2022-11-24 DIAGNOSIS — E538 Deficiency of other specified B group vitamins: Secondary | ICD-10-CM

## 2022-11-24 MED ORDER — CYANOCOBALAMIN 1000 MCG/ML IJ SOLN
1000.0000 ug | Freq: Once | INTRAMUSCULAR | Status: AC
Start: 2022-11-24 — End: 2022-11-24
  Administered 2022-11-24: 1000 ug via INTRAMUSCULAR

## 2022-11-24 NOTE — Progress Notes (Signed)
Pt presented for their vitamin B12 injection. Pt was identified through two identifiers. Pt tolerated shot well in their left  deltoid.  

## 2022-12-04 DIAGNOSIS — M170 Bilateral primary osteoarthritis of knee: Secondary | ICD-10-CM | POA: Diagnosis not present

## 2022-12-09 ENCOUNTER — Ambulatory Visit: Payer: Medicare HMO | Admitting: Cardiovascular Disease

## 2022-12-09 ENCOUNTER — Ambulatory Visit (INDEPENDENT_AMBULATORY_CARE_PROVIDER_SITE_OTHER): Payer: Medicare HMO

## 2022-12-09 ENCOUNTER — Encounter: Payer: Self-pay | Admitting: Cardiovascular Disease

## 2022-12-09 VITALS — BP 130/80 | HR 58 | Ht 63.0 in | Wt 206.4 lb

## 2022-12-09 DIAGNOSIS — R002 Palpitations: Secondary | ICD-10-CM | POA: Diagnosis not present

## 2022-12-09 DIAGNOSIS — I340 Nonrheumatic mitral (valve) insufficiency: Secondary | ICD-10-CM

## 2022-12-09 DIAGNOSIS — I10 Essential (primary) hypertension: Secondary | ICD-10-CM

## 2022-12-09 DIAGNOSIS — R0602 Shortness of breath: Secondary | ICD-10-CM

## 2022-12-09 DIAGNOSIS — E538 Deficiency of other specified B group vitamins: Secondary | ICD-10-CM | POA: Diagnosis not present

## 2022-12-09 MED ORDER — CYANOCOBALAMIN 1000 MCG/ML IJ SOLN
1000.0000 ug | Freq: Once | INTRAMUSCULAR | Status: AC
Start: 2022-12-09 — End: 2022-12-09
  Administered 2022-12-09: 1000 ug via INTRAMUSCULAR

## 2022-12-09 NOTE — Progress Notes (Signed)
Cardiology Office Note   Date:  12/09/2022   ID:  KALYNNE GOODACRE, DOB 1956-01-21, MRN [id number]  PCP:  Glori Luis, MD  Cardiologist:  Adrian Blackwater, MD      History of Present Illness: Shelby Estes is a 67 y.o. female who presents for  Chief Complaint  Patient presents with   Follow-up    3 mo f/u    Feels sleepy      Past Medical History:  Diagnosis Date   Anxiety    Depression    Diabetes mellitus 2010   Elevated BP    GERD (gastroesophageal reflux disease)    Hypertension    Kidney stone    Dr. Dallas Schimke, April 2014   Renal cyst    Dr. Dallas Schimke   SVT (supraventricular tachycardia)    s/p cardioversion   Vitamin D deficiency      Past Surgical History:  Procedure Laterality Date   ABDOMINAL HYSTERECTOMY  09/27/13   Dr. Veatrice Kells   Arm Surgery     due to fracture left elbow   CHOLECYSTECTOMY  5/07   Dr Lemar Livings   COLONOSCOPY  2007   Dr Lemar Livings   COLONOSCOPY WITH PROPOFOL N/A 06/20/2015   Procedure: COLONOSCOPY WITH PROPOFOL;  Surgeon: Earline Mayotte, MD;  Location: [facility] ENDOSCOPY;  Service: Endoscopy;  Laterality: N/A;   TUBAL LIGATION  02-21-01     Current Outpatient Medications  Medication Sig Dispense Refill   ADULT ASPIRIN REGIMEN 81 MG EC tablet      Alcohol Swabs (B-D SINGLE USE SWABS REGULAR) PADS Use daily to clean skin prior to testing/injection 100 each 3   amLODipine (NORVASC) 5 MG tablet TAKE ONE TABLET BY MOUTH DAILY 90 tablet 2   Blood Glucose Monitoring Suppl (TRUE METRIX AIR GLUCOSE METER) w/Device KIT 1 each by Does not apply route daily. 1 kit 0   Cholecalciferol (VITAMIN D) 2000 UNITS CAPS Take 1 capsule by mouth daily.     colchicine 0.6 MG tablet Take 0.6 mg by mouth 2 (two) times daily.     DULoxetine (CYMBALTA) 30 MG capsule TAKE 2 CAPSULES BY MOUTH EVERY DAY AS DIRECTED 180 capsule 1   fluticasone (FLONASE) 50 MCG/ACT nasal spray Place 2 sprays into both nostrils daily. 16 g 6   glucose  blood (GNP TRUE METRIX GLUCOSE STRIPS) test strip Check once daily. 100 each 12   JARDIANCE 25 MG TABS tablet TAKE ONE TABLET EACH MORNING BEFORE BREAKFAST 90 tablet 1   losartan (COZAAR) 100 MG tablet TAKE ONE TABLET BY MOUTH EVERY DAY ALONGWITH HCTZ 90 tablet 1   meloxicam (MOBIC) 15 MG tablet Take 15 mg by mouth daily.      metFORMIN (GLUCOPHAGE) 1000 MG tablet TAKE 1/2 TABLET BY MOUTH TWICE DAILY WITH A MEAL 180 tablet 1   metoprolol tartrate (LOPRESSOR) 25 MG tablet TAKE 1 TABLET BY MOUTH TWICE DAILY 180 tablet 0   pantoprazole (PROTONIX) 40 MG tablet TAKE ONE TABLET BY MOUTH TWICE DAILY BEFORE MEALS 60 tablet 3   polyethylene glycol powder (GLYCOLAX/MIRALAX) 17 GM/SCOOP powder Take 17 g by mouth daily as needed for mild constipation. 500 g 0   potassium chloride SA (KLOR-CON M) 20 MEQ tablet Take 2 tablets (40 mEq total) by mouth daily for 3 days. 6 tablet 0   rosuvastatin (CRESTOR) 10 MG tablet Take 1 tablet (10 mg total) by mouth daily. 90 tablet 3   tirzepatide (MOUNJARO) 10 MG/0.5ML Pen Inject 10 mg into the  skin once a week. 6 mL 2   TRUEplus Lancets 30G MISC TEST BLOOD SUGAR twice DAILY 200 each 3   No current facility-administered medications for this visit.    Allergies:   Bupropion, Clarithromycin, Enalapril, Phentermine, Ceclor [cefaclor], Penicillins, and Sulfa antibiotics    Social History:   reports that she has never smoked. She has never used smokeless tobacco. She reports that she does not drink alcohol and does not use drugs.   Family History:  family history includes Breast cancer in her maternal aunt and another family member; COPD in her father; Cancer in an other family member; Colon cancer in her cousin and sister; Diabetes in her mother; Hypertension in her mother; Seizures in her maternal grandmother; Stroke in her father; Thyroid disease in her mother.    ROS:     Review of Systems  Constitutional: Negative.   HENT: Negative.    Eyes: Negative.    Respiratory: Negative.    Gastrointestinal: Negative.   Genitourinary: Negative.   Musculoskeletal: Negative.   Skin: Negative.   Neurological: Negative.   Endo/Heme/Allergies: Negative.   Psychiatric/Behavioral: Negative.    All other systems reviewed and are negative.     All other systems are reviewed and negative.    PHYSICAL EXAM: VS:  BP 130/80   Pulse (!) 58   Ht 5\' 3"  (1.6 m)   Wt 206 lb 6.4 oz (93.6 kg)   SpO2 97%   BMI 36.56 kg/m  , BMI Body mass index is 36.56 kg/m. Last weight:  Wt Readings from Last 3 Encounters:  12/09/22 206 lb 6.4 oz (93.6 kg)  09/10/22 206 lb 6.4 oz (93.6 kg)  09/08/22 207 lb 9.6 oz (94.2 kg)     Physical Exam Constitutional:      Appearance: Normal appearance.  Cardiovascular:     Rate and Rhythm: Normal rate and regular rhythm.     Heart sounds: Normal heart sounds.  Pulmonary:     Effort: Pulmonary effort is normal.     Breath sounds: Normal breath sounds.  Musculoskeletal:     Right lower leg: No edema.     Left lower leg: No edema.  Neurological:     Mental Status: She is alert.       EKG:   Recent Labs: 09/10/2022: ALT 8; BUN 13; Creatinine, Ser 0.90; Hemoglobin 12.5; Platelets 466.0; Sodium 141 09/25/2022: Potassium 4.4    Lipid Panel    Component Value Date/Time   CHOL 107 06/06/2022 1402   TRIG 146 06/06/2022 1402   HDL 46 06/06/2022 1402   CHOLHDL 2.3 06/06/2022 1402   CHOLHDL 3 05/03/2021 1037   VLDL 43.4 (H) 05/03/2021 1037   LDLCALC 36 06/06/2022 1402   LDLDIRECT 50.0 05/03/2021 1037      Other studies Reviewed: Additional studies/ records that were reviewed today include:  Review of the above records demonstrates:       No data to display            ASSESSMENT AND PLAN:    ICD-10-CM   1. Palpitations  R00.2    resolved    2. Primary hypertension  I10     3. SOB (shortness of breath)  R06.02    resolved    4. Nonrheumatic mitral valve regurgitation  I34.0     5. Mitral valve  insufficiency, unspecified etiology  I34.0        Problem List Items Addressed This Visit       Cardiovascular and Mediastinum  Hypertension (Chronic)     Other   Palpitations - Primary   Other Visit Diagnoses     SOB (shortness of breath)       resolved   Nonrheumatic mitral valve regurgitation       Mitral valve insufficiency, unspecified etiology              Disposition:   No follow-ups on file.    Total time spent: 40 minutes  Signed,  Adrian Blackwater, MD  12/09/2022 10:09 AM    [facility]

## 2022-12-09 NOTE — Progress Notes (Signed)
Patient presented for B 12 injection to left deltoid, patient voiced no concerns nor showed any signs of distress during injection.

## 2022-12-10 ENCOUNTER — Other Ambulatory Visit: Payer: Self-pay | Admitting: Family Medicine

## 2022-12-10 DIAGNOSIS — K219 Gastro-esophageal reflux disease without esophagitis: Secondary | ICD-10-CM

## 2022-12-29 ENCOUNTER — Ambulatory Visit (INDEPENDENT_AMBULATORY_CARE_PROVIDER_SITE_OTHER): Payer: Medicare HMO

## 2022-12-29 DIAGNOSIS — E538 Deficiency of other specified B group vitamins: Secondary | ICD-10-CM | POA: Diagnosis not present

## 2022-12-29 MED ORDER — CYANOCOBALAMIN 1000 MCG/ML IJ SOLN
1000.0000 ug | Freq: Once | INTRAMUSCULAR | Status: AC
Start: 2022-12-29 — End: 2022-12-29
  Administered 2022-12-29: 1000 ug via INTRAMUSCULAR

## 2022-12-29 NOTE — Progress Notes (Signed)
Patient presented for a B12 injection and it was administered into her right deltoid. Patient tolerated the injection well.

## 2023-01-01 DIAGNOSIS — M170 Bilateral primary osteoarthritis of knee: Secondary | ICD-10-CM | POA: Diagnosis not present

## 2023-01-08 DIAGNOSIS — M170 Bilateral primary osteoarthritis of knee: Secondary | ICD-10-CM | POA: Diagnosis not present

## 2023-01-12 ENCOUNTER — Ambulatory Visit (INDEPENDENT_AMBULATORY_CARE_PROVIDER_SITE_OTHER): Payer: Medicare HMO

## 2023-01-12 DIAGNOSIS — E538 Deficiency of other specified B group vitamins: Secondary | ICD-10-CM

## 2023-01-12 MED ORDER — CYANOCOBALAMIN 1000 MCG/ML IJ SOLN
1000.0000 ug | Freq: Once | INTRAMUSCULAR | Status: AC
Start: 2023-01-12 — End: 2023-01-12
  Administered 2023-01-12: 1000 ug via INTRAMUSCULAR

## 2023-01-12 NOTE — Progress Notes (Signed)
Pt presented for their vitamin B12 injection. Pt was identified through two identifiers. Pt tolerated shot well in their left  deltoid.  

## 2023-01-15 DIAGNOSIS — M170 Bilateral primary osteoarthritis of knee: Secondary | ICD-10-CM | POA: Diagnosis not present

## 2023-01-16 ENCOUNTER — Other Ambulatory Visit: Payer: Self-pay | Admitting: Cardiovascular Disease

## 2023-01-26 ENCOUNTER — Ambulatory Visit (INDEPENDENT_AMBULATORY_CARE_PROVIDER_SITE_OTHER): Payer: Medicare HMO

## 2023-01-26 DIAGNOSIS — E538 Deficiency of other specified B group vitamins: Secondary | ICD-10-CM

## 2023-01-26 MED ORDER — CYANOCOBALAMIN 1000 MCG/ML IJ SOLN
1000.0000 ug | Freq: Once | INTRAMUSCULAR | Status: AC
Start: 2023-01-26 — End: 2023-01-26
  Administered 2023-01-26: 1000 ug via INTRAMUSCULAR

## 2023-01-26 NOTE — Progress Notes (Signed)
Pt presented for their vitamin B12 injection. Pt was identified through two identifiers. Pt tolerated shot well in their right deltoid.  

## 2023-02-05 ENCOUNTER — Other Ambulatory Visit: Payer: Self-pay | Admitting: Family Medicine

## 2023-02-05 DIAGNOSIS — E1169 Type 2 diabetes mellitus with other specified complication: Secondary | ICD-10-CM

## 2023-02-09 ENCOUNTER — Other Ambulatory Visit: Payer: Self-pay | Admitting: Family Medicine

## 2023-02-09 ENCOUNTER — Ambulatory Visit (INDEPENDENT_AMBULATORY_CARE_PROVIDER_SITE_OTHER): Payer: Medicare HMO

## 2023-02-09 DIAGNOSIS — E538 Deficiency of other specified B group vitamins: Secondary | ICD-10-CM

## 2023-02-09 MED ORDER — CYANOCOBALAMIN 1000 MCG/ML IJ SOLN
1000.0000 ug | Freq: Once | INTRAMUSCULAR | Status: AC
Start: 2023-02-09 — End: 2023-02-09
  Administered 2023-02-09: 1000 ug via INTRAMUSCULAR

## 2023-02-09 NOTE — Progress Notes (Signed)
Pt presented for their vitamin B12 injection. Pt was identified through two identifiers. Pt tolerated shot well in their left  deltoid.  

## 2023-02-23 ENCOUNTER — Ambulatory Visit (INDEPENDENT_AMBULATORY_CARE_PROVIDER_SITE_OTHER): Payer: Medicare HMO

## 2023-02-23 DIAGNOSIS — E538 Deficiency of other specified B group vitamins: Secondary | ICD-10-CM

## 2023-02-23 MED ORDER — CYANOCOBALAMIN 1000 MCG/ML IJ SOLN
1000.0000 ug | Freq: Once | INTRAMUSCULAR | Status: AC
Start: 2023-02-23 — End: 2023-02-23
  Administered 2023-02-23: 1000 ug via INTRAMUSCULAR

## 2023-02-23 NOTE — Progress Notes (Signed)
Pt presented for their vitamin B12 injection. Pt was identified through two identifiers. Pt tolerated shot well in their right deltoid.  

## 2023-02-25 ENCOUNTER — Other Ambulatory Visit: Payer: Self-pay | Admitting: Cardiovascular Disease

## 2023-02-26 DIAGNOSIS — M7712 Lateral epicondylitis, left elbow: Secondary | ICD-10-CM | POA: Diagnosis not present

## 2023-02-26 DIAGNOSIS — M19022 Primary osteoarthritis, left elbow: Secondary | ICD-10-CM | POA: Diagnosis not present

## 2023-03-03 ENCOUNTER — Telehealth: Payer: Self-pay | Admitting: Family Medicine

## 2023-03-03 NOTE — Telephone Encounter (Signed)
Copied from CRM [id number]. Topic: Medicare AWV >> Mar 03, 2023  3:43 PM Payton Doughty wrote: Reason for CRM: LVM 03/03/23 to r/s AWV due to [facility] out of office. New AWV date 03/29/2022 at 3pm. Please confirm date change.  Verlee Rossetti; Care Guide Ambulatory Clinical Support [place] [facility] Direct Dial: [phone]

## 2023-03-13 ENCOUNTER — Ambulatory Visit (INDEPENDENT_AMBULATORY_CARE_PROVIDER_SITE_OTHER): Payer: Medicare HMO | Admitting: Cardiovascular Disease

## 2023-03-13 ENCOUNTER — Telehealth: Payer: Self-pay | Admitting: Family Medicine

## 2023-03-13 ENCOUNTER — Encounter: Payer: Self-pay | Admitting: Family Medicine

## 2023-03-13 ENCOUNTER — Encounter: Payer: Self-pay | Admitting: Cardiovascular Disease

## 2023-03-13 ENCOUNTER — Ambulatory Visit (INDEPENDENT_AMBULATORY_CARE_PROVIDER_SITE_OTHER): Payer: Medicare HMO | Admitting: Family Medicine

## 2023-03-13 VITALS — BP 130/80 | HR 77 | Ht 63.0 in | Wt 200.2 lb

## 2023-03-13 VITALS — BP 134/84 | HR 76 | Temp 97.8°F | Ht 63.0 in | Wt 197.6 lb

## 2023-03-13 DIAGNOSIS — G4733 Obstructive sleep apnea (adult) (pediatric): Secondary | ICD-10-CM

## 2023-03-13 DIAGNOSIS — R911 Solitary pulmonary nodule: Secondary | ICD-10-CM

## 2023-03-13 DIAGNOSIS — E538 Deficiency of other specified B group vitamins: Secondary | ICD-10-CM

## 2023-03-13 DIAGNOSIS — R0789 Other chest pain: Secondary | ICD-10-CM

## 2023-03-13 DIAGNOSIS — I340 Nonrheumatic mitral (valve) insufficiency: Secondary | ICD-10-CM

## 2023-03-13 DIAGNOSIS — E669 Obesity, unspecified: Secondary | ICD-10-CM

## 2023-03-13 DIAGNOSIS — I10 Essential (primary) hypertension: Secondary | ICD-10-CM

## 2023-03-13 DIAGNOSIS — R079 Chest pain, unspecified: Secondary | ICD-10-CM | POA: Diagnosis not present

## 2023-03-13 DIAGNOSIS — Z01810 Encounter for preprocedural cardiovascular examination: Secondary | ICD-10-CM

## 2023-03-13 DIAGNOSIS — Z7984 Long term (current) use of oral hypoglycemic drugs: Secondary | ICD-10-CM

## 2023-03-13 DIAGNOSIS — E1169 Type 2 diabetes mellitus with other specified complication: Secondary | ICD-10-CM | POA: Diagnosis not present

## 2023-03-13 DIAGNOSIS — D72829 Elevated white blood cell count, unspecified: Secondary | ICD-10-CM | POA: Diagnosis not present

## 2023-03-13 LAB — CBC WITH DIFFERENTIAL/PLATELET
Basophils Absolute: 0 10*3/uL (ref 0.0–0.1)
Basophils Relative: 0.3 % (ref 0.0–3.0)
Eosinophils Absolute: 0.2 10*3/uL (ref 0.0–0.7)
Eosinophils Relative: 1.7 % (ref 0.0–5.0)
HCT: 38.1 % (ref 36.0–46.0)
Hemoglobin: 12.3 g/dL (ref 12.0–15.0)
Lymphocytes Relative: 18.1 % (ref 12.0–46.0)
Lymphs Abs: 2.4 10*3/uL (ref 0.7–4.0)
MCHC: 32.2 g/dL (ref 30.0–36.0)
MCV: 86.2 fL (ref 78.0–100.0)
Monocytes Absolute: 0.8 10*3/uL (ref 0.1–1.0)
Monocytes Relative: 6.2 % (ref 3.0–12.0)
Neutro Abs: 9.6 10*3/uL — ABNORMAL HIGH (ref 1.4–7.7)
Neutrophils Relative %: 73.7 % (ref 43.0–77.0)
Platelets: 445 10*3/uL — ABNORMAL HIGH (ref 150.0–400.0)
RBC: 4.42 Mil/uL (ref 3.87–5.11)
RDW: 15.3 % (ref 11.5–15.5)
WBC: 13 10*3/uL — ABNORMAL HIGH (ref 4.0–10.5)

## 2023-03-13 LAB — BASIC METABOLIC PANEL
BUN: 14 mg/dL (ref 6–23)
CO2: 26 meq/L (ref 19–32)
Calcium: 9.2 mg/dL (ref 8.4–10.5)
Chloride: 105 meq/L (ref 96–112)
Creatinine, Ser: 0.86 mg/dL (ref 0.40–1.20)
GFR: 70.1 mL/min (ref >60.00)
Glucose, Bld: 103 mg/dL — ABNORMAL HIGH (ref 70–99)
Potassium: 3.4 meq/L — ABNORMAL LOW (ref 3.5–5.1)
Sodium: 141 meq/L (ref 135–145)

## 2023-03-13 LAB — MICROALBUMIN / CREATININE URINE RATIO
Creatinine,U: 76.7 mg/dL
Microalb Creat Ratio: 0.9 mg/g (ref 0.0–30.0)
Microalb, Ur: <0.7 mg/dL (ref 0.0–1.9)

## 2023-03-13 LAB — HEMOGLOBIN A1C: Hgb A1c MFr Bld: 6.1 % (ref 4.6–6.5)

## 2023-03-13 MED ORDER — CYANOCOBALAMIN 1000 MCG/ML IJ SOLN
1000.0000 ug | Freq: Once | INTRAMUSCULAR | Status: AC
  Administered 2023-03-13: 1000 ug via INTRAMUSCULAR

## 2023-03-13 NOTE — Assessment & Plan Note (Signed)
Chronic issue.  Diastolic blood pressure slightly above goal.  Discussed the option of checking frequently for the next several weeks and sending [place] her readings versus going ahead and increasing her amlodipine.  Patient opts to monitor.  She will send [place] readings in 2 weeks and if not at goal we can increase her amlodipine to 10 mg daily.  For now she will continue amlodipine 5 mg daily, metoprolol 25 mg twice daily, and losartan 100 mg daily.

## 2023-03-13 NOTE — Telephone Encounter (Signed)
Lft pt vm to call ofc to sch CT. thanks 

## 2023-03-13 NOTE — Progress Notes (Signed)
Marikay Alar, MD Phone: [phone]  Shelby Estes is a 67 y.o. female who presents today for f/u.  HYPERTENSION Disease Monitoring Home BP Monitoring 120s/80s Chest pain- no    Dyspnea- no Medications Compliance-  taking amlodipine, metoprolol, losartan.  Edema- no BMET    Component Value Date/Time   NA 141 09/10/2022 1013   NA 144 01/31/2020 0832   NA 140 09/22/2013 1523   K 4.4 09/25/2022 1121   K 3.8 09/22/2013 1523   CL 106 09/10/2022 1013   CL 104 09/22/2013 1523   CO2 26 09/10/2022 1013   CO2 28 09/22/2013 1523   GLUCOSE 100 (H) 09/10/2022 1013   GLUCOSE 105 (H) 09/22/2013 1523   BUN 13 09/10/2022 1013   BUN 11 01/31/2020 0832   BUN 10 09/22/2013 1523   CREATININE 0.90 09/10/2022 1013   CREATININE 0.87 09/22/2013 1523   CALCIUM 9.8 09/10/2022 1013   CALCIUM 9.8 09/22/2013 1523   GFRNONAA 57 (L) 01/31/2020 0832   GFRNONAA >60 09/22/2013 1523   GFRAA 66 01/31/2020 0832   GFRAA >60 09/22/2013 1523   DIABETES Disease Monitoring: Blood Sugar ranges-92-110 fasting Polyuria/phagia/dipsia- no      Optho- UTD Medications: Compliance- taking jardiance, metformin, mounjaro Hypoglycemic symptoms- no  Lung nodule: Patient is due for follow-up imaging.  B12 deficiency: Taking vitamin B12 injections twice weekly.  Patient will unders if the metformin is messing with her ability to process the vitamin B12.   Social History   Tobacco Use  Smoking Status Never  Smokeless Tobacco Never    Current Outpatient Medications on File Prior to Visit  Medication Sig Dispense Refill   ADULT ASPIRIN REGIMEN 81 MG EC tablet      Alcohol Swabs (B-D SINGLE USE SWABS REGULAR) PADS Use daily to clean skin prior to testing/injection 100 each 3   amLODipine (NORVASC) 5 MG tablet TAKE ONE TABLET BY MOUTH DAILY 90 tablet 2   Blood Glucose Monitoring Suppl (TRUE METRIX AIR GLUCOSE METER) w/Device KIT 1 each by Does not apply route daily. 1 kit 0   Cholecalciferol (VITAMIN D) 2000  UNITS CAPS Take 1 capsule by mouth daily.     colchicine 0.6 MG tablet Take 0.6 mg by mouth 2 (two) times daily.     DULoxetine (CYMBALTA) 30 MG capsule TAKE 2 CAPSULES BY MOUTH EVERY DAY AS DIRECTED 180 capsule 1   fluticasone (FLONASE) 50 MCG/ACT nasal spray Place 2 sprays into both nostrils daily. 16 g 6   glucose blood (GNP TRUE METRIX GLUCOSE STRIPS) test strip Check once daily. 100 each 12   JARDIANCE 25 MG TABS tablet TAKE ONE TABLET EACH MORNING BEFORE BREAKFAST 90 tablet 1   losartan (COZAAR) 100 MG tablet TAKE ONE TABLET BY MOUTH EVERY DAY ALONGWITH HCTZ 90 tablet 1   meloxicam (MOBIC) 15 MG tablet Take 15 mg by mouth daily.      metFORMIN (GLUCOPHAGE) 1000 MG tablet TAKE 1/2 TABLET BY MOUTH TWICE DAILY WITH A MEAL 180 tablet 1   metoprolol tartrate (LOPRESSOR) 25 MG tablet TAKE 1 TABLET BY MOUTH TWICE DAILY 180 tablet 0   pantoprazole (PROTONIX) 40 MG tablet TAKE ONE TABLET BY MOUTH TWICE DAILY BEFORE MEALS 60 tablet 3   polyethylene glycol powder (GLYCOLAX/MIRALAX) 17 GM/SCOOP powder Take 17 g by mouth daily as needed for mild constipation. 500 g 0   rosuvastatin (CRESTOR) 10 MG tablet Take 1 tablet (10 mg total) by mouth daily. 90 tablet 3   tirzepatide (MOUNJARO) 10 MG/0.5ML Pen  INJECT 10MG  (1 PEN) UNDER THE SKIN ONE TIME WEEKLY 12 mL 3   TRUEplus Lancets 30G MISC TEST BLOOD SUGAR twice DAILY 200 each 3   potassium chloride SA (KLOR-CON M) 20 MEQ tablet Take 2 tablets (40 mEq total) by mouth daily for 3 days. 6 tablet 0   No current facility-administered medications on file prior to visit.     ROS see history of present illness  Objective  Physical Exam Vitals:   03/13/23 1012  BP: 134/84  Pulse: 76  Temp: 97.8 F (36.6 C)  SpO2: 99%    BP Readings from Last 3 Encounters:  03/13/23 134/84  12/09/22 130/80  09/10/22 122/74   Wt Readings from Last 3 Encounters:  03/13/23 197 lb 9.6 oz (89.6 kg)  12/09/22 206 lb 6.4 oz (93.6 kg)  09/10/22 206 lb 6.4 oz (93.6 kg)     Physical Exam Constitutional:      General: She is not in acute distress.    Appearance: She is not diaphoretic.  Cardiovascular:     Rate and Rhythm: Normal rate and regular rhythm.     Heart sounds: Normal heart sounds.  Pulmonary:     Effort: Pulmonary effort is normal.     Breath sounds: Normal breath sounds.  Skin:    General: Skin is warm and dry.  Neurological:     Mental Status: She is alert.      Assessment/Plan: Please see individual problem list.  Primary hypertension Assessment & Plan: Chronic issue.  Diastolic blood pressure slightly above goal.  Discussed the option of checking frequently for the next several weeks and sending [place] her readings versus going ahead and increasing her amlodipine.  Patient opts to monitor.  She will send [place] readings in 2 weeks and if not at goal we can increase her amlodipine to 10 mg daily.  For now she will continue amlodipine 5 mg daily, metoprolol 25 mg twice daily, and losartan 100 mg daily.  Orders: -     Basic metabolic panel  Lung nodule Assessment & Plan: Chronic issue.  Recheck CT imaging.  Orders: -     CT CHEST WO CONTRAST; Future  Type 2 diabetes mellitus with obesity (HCC) Assessment & Plan: Chronic issue.  Check A1c.  Continue Mounjaro 10 mg daily, metformin 500 mg daily, and Jardiance 25 mg daily.  Orders: -     Hemoglobin A1c -     Microalbumin / creatinine urine ratio  B12 deficiency Assessment & Plan: Chronic issue.  Continue B12 injections every 2 weeks.  Discussed that the metformin should not be causing an issue with the B12 injections as the injection skips the GI tract.  Orders: -     Cyanocobalamin  Leukocytosis, unspecified type Assessment & Plan: Recheck CBC with differential.  Orders: -     CBC with Differential/Platelet    Return in about 6 months (around 09/11/2023) for transfer of care.   Marikay Alar, MD [facility]

## 2023-03-13 NOTE — Assessment & Plan Note (Signed)
Chronic issue.  Continue B12 injections every 2 weeks.  Discussed that the metformin should not be causing an issue with the B12 injections as the injection skips the GI tract.

## 2023-03-13 NOTE — Assessment & Plan Note (Signed)
Chronic issue.  Recheck CT imaging.

## 2023-03-13 NOTE — Assessment & Plan Note (Signed)
Recheck CBC with differential

## 2023-03-13 NOTE — Progress Notes (Addendum)
Cardiology Office Note   Date:  03/13/2023   ID:  Shelby Estes, DOB [birth year], MRN [id number]  PCP:  Glori Luis, MD  Cardiologist:  Adrian Blackwater, MD      History of Present Illness: Shelby Estes is a 67 y.o. female who presents for  Chief Complaint  Patient presents with   Follow-up    3 mo    Doing well      Past Medical History:  Diagnosis Date   Anxiety    Depression    Diabetes mellitus 2010   Elevated BP    GERD (gastroesophageal reflux disease)    Hypertension    Kidney stone    Dr. Dallas Schimke, April 2014   Renal cyst    Dr. Daniels-urologist   SVT (supraventricular tachycardia) [facility])    s/p cardioversion   Vitamin D deficiency      Past Surgical History:  Procedure Laterality Date   ABDOMINAL HYSTERECTOMY  09/27/13   Dr. Veatrice Kells   Arm Surgery     due to fracture left elbow   CHOLECYSTECTOMY  5/07   Dr Lemar Livings   COLONOSCOPY  2007   Dr Lemar Livings   COLONOSCOPY WITH PROPOFOL N/A 06/20/2015   Procedure: COLONOSCOPY WITH PROPOFOL;  Surgeon: Earline Mayotte, MD;  Location: [facility] ENDOSCOPY;  Service: Endoscopy;  Laterality: N/A;   TUBAL LIGATION  02-21-01     Current Outpatient Medications  Medication Sig Dispense Refill   ADULT ASPIRIN REGIMEN 81 MG EC tablet      Alcohol Swabs (B-D SINGLE USE SWABS REGULAR) PADS Use daily to clean skin prior to testing/injection 100 each 3   amLODipine (NORVASC) 5 MG tablet TAKE ONE TABLET BY MOUTH DAILY 90 tablet 2   Blood Glucose Monitoring Suppl (TRUE METRIX AIR GLUCOSE METER) w/Device KIT 1 each by Does not apply route daily. 1 kit 0   Cholecalciferol (VITAMIN D) 2000 UNITS CAPS Take 1 capsule by mouth daily.     colchicine 0.6 MG tablet Take 0.6 mg by mouth 2 (two) times daily.     DULoxetine (CYMBALTA) 30 MG capsule TAKE 2 CAPSULES BY MOUTH EVERY DAY AS DIRECTED 180 capsule 1   fluticasone (FLONASE) 50 MCG/ACT nasal spray Place 2 sprays into both nostrils daily. 16 g 6    glucose blood (GNP TRUE METRIX GLUCOSE STRIPS) test strip Check once daily. 100 each 12   JARDIANCE 25 MG TABS tablet TAKE ONE TABLET EACH MORNING BEFORE BREAKFAST 90 tablet 1   losartan (COZAAR) 100 MG tablet TAKE ONE TABLET BY MOUTH EVERY DAY ALONGWITH HCTZ 90 tablet 1   meloxicam (MOBIC) 15 MG tablet Take 15 mg by mouth daily.      metFORMIN (GLUCOPHAGE) 1000 MG tablet TAKE 1/2 TABLET BY MOUTH TWICE DAILY WITH A MEAL 180 tablet 1   metoprolol tartrate (LOPRESSOR) 25 MG tablet TAKE 1 TABLET BY MOUTH TWICE DAILY 180 tablet 0   pantoprazole (PROTONIX) 40 MG tablet TAKE ONE TABLET BY MOUTH TWICE DAILY BEFORE MEALS 60 tablet 3   polyethylene glycol powder (GLYCOLAX/MIRALAX) 17 GM/SCOOP powder Take 17 g by mouth daily as needed for mild constipation. 500 g 0   rosuvastatin (CRESTOR) 10 MG tablet Take 1 tablet (10 mg total) by mouth daily. 90 tablet 3   tirzepatide (MOUNJARO) 10 MG/0.5ML Pen INJECT 10MG  (1 PEN) UNDER THE SKIN ONE TIME WEEKLY 12 mL 3   TRUEplus Lancets 30G MISC TEST BLOOD SUGAR twice DAILY 200 each 3   potassium  chloride SA (KLOR-CON M) 20 MEQ tablet Take 2 tablets (40 mEq total) by mouth daily for 3 days. (Patient not taking: Reported on 03/13/2023) 6 tablet 0   No current facility-administered medications for this visit.    Allergies:   Bupropion, Clarithromycin, Enalapril, Phentermine, Ceclor [cefaclor], Penicillins, and Sulfa antibiotics    Social History:   reports that she has never smoked. She has never used smokeless tobacco. She reports that she does not drink alcohol and does not use drugs.   Family History:  family history includes Breast cancer in her maternal aunt and another family member; COPD in her father; Cancer in an other family member; Colon cancer in her cousin and sister; Diabetes in her mother; Hypertension in her mother; Seizures in her maternal grandmother; Stroke in her father; Thyroid disease in her mother.    ROS:     Review of Systems   Constitutional: Negative.   HENT: Negative.    Eyes: Negative.   Respiratory: Negative.    Gastrointestinal: Negative.   Genitourinary: Negative.   Musculoskeletal: Negative.   Skin: Negative.   Neurological: Negative.   Endo/Heme/Allergies: Negative.   Psychiatric/Behavioral: Negative.    All other systems reviewed and are negative.     All other systems are reviewed and negative.    PHYSICAL EXAM: VS:  BP 130/80   Pulse 77   Ht 5\' 3"  (1.6 m)   Wt 200 lb 3.2 oz (90.8 kg)   SpO2 97%   BMI 35.46 kg/m  , BMI Body mass index is 35.46 kg/m. Last weight:  Wt Readings from Last 3 Encounters:  03/13/23 200 lb 3.2 oz (90.8 kg)  03/13/23 197 lb 9.6 oz (89.6 kg)  12/09/22 206 lb 6.4 oz (93.6 kg)     Physical Exam Constitutional:      Appearance: Normal appearance.  Cardiovascular:     Rate and Rhythm: Normal rate and regular rhythm.     Heart sounds: Normal heart sounds.  Pulmonary:     Effort: Pulmonary effort is normal.     Breath sounds: Normal breath sounds.  Musculoskeletal:     Right lower leg: No edema.     Left lower leg: No edema.  Neurological:     Mental Status: She is alert.       EKG:   Recent Labs: 09/10/2022: ALT 8; BUN 13; Creatinine, Ser 0.90; Hemoglobin 12.5; Platelets 466.0; Sodium 141 09/25/2022: Potassium 4.4    Lipid Panel    Component Value Date/Time   CHOL 107 06/06/2022 1402   TRIG 146 06/06/2022 1402   HDL 46 06/06/2022 1402   CHOLHDL 2.3 06/06/2022 1402   CHOLHDL 3 05/03/2021 1037   VLDL 43.4 (H) 05/03/2021 1037   LDLCALC 36 06/06/2022 1402   LDLDIRECT 50.0 05/03/2021 1037      Other studies Reviewed: Additional studies/ records that were reviewed today include:  Review of the above records demonstrates:       No data to display            ASSESSMENT AND PLAN:    ICD-10-CM   1. Pre-operative cardiovascular examination  Z01.810    Both knees need replacement, and advise procceding    2. Primary hypertension   I10     3. Nonrheumatic mitral valve regurgitation  I34.0     4. Mitral valve insufficiency, unspecified etiology  I34.0     5. Chest pain, non-cardiac  R07.89     6. OSA on CPAP  G47.33  Problem List Items Addressed This Visit       Cardiovascular and Mediastinum   Hypertension (Chronic)     Respiratory   OSA on CPAP (Chronic)   Other Visit Diagnoses       Pre-operative cardiovascular examination    -  Primary   Both knees need replacement, and advise procceding     Nonrheumatic mitral valve regurgitation         Mitral valve insufficiency, unspecified etiology         Chest pain, non-cardiac              Disposition:   Return in about 3 months (around 06/11/2023).    Total time spent: 30 minutes  Signed,  Adrian Blackwater, MD  03/13/2023 10:46 AM    [facility]

## 2023-03-13 NOTE — Assessment & Plan Note (Signed)
Chronic issue.  Check A1c.  Continue Mounjaro 10 mg daily, metformin 500 mg daily, and Jardiance 25 mg daily.

## 2023-03-26 ENCOUNTER — Ambulatory Visit
Admission: RE | Admit: 2023-03-26 | Discharge: 2023-03-26 | Disposition: A | Discharge status: Home / Self Care | Payer: Medicare Other | Source: Ambulatory Visit | Attending: Family Medicine | Admitting: Family Medicine

## 2023-03-26 ENCOUNTER — Telehealth: Payer: Self-pay | Admitting: Family Medicine

## 2023-03-26 ENCOUNTER — Encounter: Payer: Self-pay | Admitting: *Deleted

## 2023-03-26 DIAGNOSIS — M170 Bilateral primary osteoarthritis of knee: Secondary | ICD-10-CM | POA: Diagnosis not present

## 2023-03-26 DIAGNOSIS — R911 Solitary pulmonary nodule: Secondary | ICD-10-CM | POA: Diagnosis not present

## 2023-03-26 NOTE — Telephone Encounter (Signed)
 Patient stopped by clinic and will be having surgery soon. Needs labs faxed to [phone]. She states she had labs done here on 03/13/23. The ones she needs faxed over are the CBC, A1c and the  complete metabolic panel. Thank you  Please call patient when faxed over.

## 2023-03-27 ENCOUNTER — Ambulatory Visit: Payer: Medicare HMO

## 2023-03-30 ENCOUNTER — Ambulatory Visit (INDEPENDENT_AMBULATORY_CARE_PROVIDER_SITE_OTHER): Payer: Medicare Other | Admitting: *Deleted

## 2023-03-30 ENCOUNTER — Encounter: Payer: Self-pay | Admitting: Family Medicine

## 2023-03-30 ENCOUNTER — Ambulatory Visit (INDEPENDENT_AMBULATORY_CARE_PROVIDER_SITE_OTHER): Payer: Medicare HMO

## 2023-03-30 VITALS — Ht 63.0 in | Wt 196.0 lb

## 2023-03-30 DIAGNOSIS — E538 Deficiency of other specified B group vitamins: Secondary | ICD-10-CM | POA: Diagnosis not present

## 2023-03-30 DIAGNOSIS — Z0000 Encounter for general adult medical examination without abnormal findings: Secondary | ICD-10-CM

## 2023-03-30 MED ORDER — CYANOCOBALAMIN 1000 MCG/ML IJ SOLN
1000.0000 ug | Freq: Once | INTRAMUSCULAR | Status: AC
  Administered 2023-03-30: 1000 ug via INTRAMUSCULAR

## 2023-03-30 NOTE — Progress Notes (Signed)
 Subjective:   Shelby Estes is a 68 y.o. female who presents for Medicare Annual (Subsequent) preventive examination.  Visit Complete: Virtual I connected with  Shelby Estes on 03/30/23 by a audio enabled telemedicine application and verified that I am speaking with the correct person using two identifiers. This patient declined Interactive audio and acupuncturist. Therefore the visit was completed with audio only.   Patient Location: Home  Provider Location: Office/Clinic  I discussed the limitations of evaluation and management by telemedicine. The patient expressed understanding and agreed to proceed.  Vital Signs: Because this visit was a virtual/telehealth visit, some criteria may be missing or patient reported. Any vitals not documented were not able to be obtained and vitals that have been documented are patient reported.   Cardiac Risk Factors include: advanced age (>56men, >26 women);diabetes mellitus;dyslipidemia;hypertension;obesity (BMI >30kg/m2)     Objective:    Today's Vitals   03/30/23 1501  Weight: 196 lb (88.9 kg)  Height: 5' 3 (1.6 m)  PainSc: 5    Body mass index is 34.72 kg/m.     03/30/2023    3:14 PM 03/24/2022    9:19 AM 10/18/2018    9:57 AM 04/19/2018   10:08 AM 09/15/2017    2:54 PM 09/01/2017    3:39 PM 08/09/2015    8:02 AM  Advanced Directives  Does Patient Have a Medical Advance Directive? No No No No No No No  Would patient like information on creating a medical advance directive? No - Patient declined No - Patient declined No - Patient declined No - Patient declined   No - patient declined information    Current Medications (verified) Outpatient Encounter Medications as of 03/30/2023  Medication Sig   ADULT ASPIRIN REGIMEN 81 MG EC tablet    Alcohol Swabs (B-D SINGLE USE SWABS REGULAR) PADS Use daily to clean skin prior to testing/injection   amLODipine  (NORVASC ) 5 MG tablet TAKE ONE TABLET BY MOUTH DAILY   Blood Glucose  Monitoring Suppl (TRUE METRIX AIR GLUCOSE METER) w/Device KIT 1 each by Does not apply route daily.   Cholecalciferol (VITAMIN D ) 2000 UNITS CAPS Take 1 capsule by mouth daily.   colchicine 0.6 MG tablet Take 0.6 mg by mouth 2 (two) times daily.   DULoxetine  (CYMBALTA ) 30 MG capsule TAKE 2 CAPSULES BY MOUTH EVERY DAY AS DIRECTED   fluticasone  (FLONASE ) 50 MCG/ACT nasal spray Place 2 sprays into both nostrils daily. (Patient taking differently: Place 2 sprays into both nostrils daily. As needed)   glucose blood (GNP TRUE METRIX GLUCOSE STRIPS) test strip Check once daily.   JARDIANCE  25 MG TABS tablet TAKE ONE TABLET EACH MORNING BEFORE BREAKFAST   linaclotide (LINZESS) 72 MCG capsule Take 72 mcg by mouth daily before breakfast.   losartan  (COZAAR ) 100 MG tablet TAKE ONE TABLET BY MOUTH EVERY DAY ALONGWITH HCTZ   meloxicam (MOBIC) 15 MG tablet Take 15 mg by mouth daily.    metFORMIN  (GLUCOPHAGE ) 1000 MG tablet TAKE 1/2 TABLET BY MOUTH TWICE DAILY WITH A MEAL   metoprolol  tartrate (LOPRESSOR ) 25 MG tablet TAKE 1 TABLET BY MOUTH TWICE DAILY   pantoprazole  (PROTONIX ) 40 MG tablet TAKE ONE TABLET BY MOUTH TWICE DAILY BEFORE MEALS   polyethylene glycol powder (GLYCOLAX /MIRALAX ) 17 GM/SCOOP powder Take 17 g by mouth daily as needed for mild constipation.   rosuvastatin  (CRESTOR ) 10 MG tablet Take 1 tablet (10 mg total) by mouth daily.   tirzepatide  (MOUNJARO ) 10 MG/0.5ML Pen INJECT 10MG  (1 PEN)  UNDER THE SKIN ONE TIME WEEKLY   TRUEplus Lancets 30G MISC TEST BLOOD SUGAR twice DAILY   potassium chloride  SA (KLOR-CON  M) 20 MEQ tablet Take 2 tablets (40 mEq total) by mouth daily for 3 days. (Patient not taking: Reported on 03/13/2023)   No facility-administered encounter medications on file as of 03/30/2023.    Allergies (verified) Bupropion , Clarithromycin, Enalapril , Phentermine , Ceclor [cefaclor], Penicillins, and Sulfa antibiotics   History: Past Medical History:  Diagnosis Date   Anxiety     Depression    Diabetes mellitus 2010   Elevated BP    GERD (gastroesophageal reflux disease)    Hypertension    Kidney stone    Dr. Jodi, April 2014   Renal cyst    Dr. Daniels-urologist   SVT (supraventricular tachycardia) [facility])    s/p cardioversion   Vitamin D  deficiency    Past Surgical History:  Procedure Laterality Date   ABDOMINAL HYSTERECTOMY  09/27/13   Dr. Shelda Pinal   Arm Surgery     due to fracture left elbow   CHOLECYSTECTOMY  5/07   Dr Dessa   COLONOSCOPY  2007   Dr Dessa   COLONOSCOPY WITH PROPOFOL  N/A 06/20/2015   Procedure: COLONOSCOPY WITH PROPOFOL ;  Surgeon: Reyes LELON Dessa, MD;  Location[facility] ENDOSCOPY;  Service: Endoscopy;  Laterality: N/A;   TUBAL LIGATION  02-21-01   Family History  Problem Relation Age of Onset   COPD Father    Stroke Father    Diabetes Mother        borderline   Hypertension Mother    Thyroid  disease Mother    Breast cancer Maternal Aunt    Seizures Maternal Grandmother    Breast cancer Other    Cancer Other        breast   Colon cancer Sister        41   Colon cancer Cousin        13's   Social History   Socioeconomic History   Marital status: Married    Spouse name: Not on file   Number of children: 0   Years of education: Not on file   Highest education level: Associate degree: academic program  Occupational History   Occupation: Producer, Television/film/video: OTHER   Occupation: retired  Tobacco Use   Smoking status: Never   Smokeless tobacco: Never  Vaping Use   Vaping status: Never Used  Substance and Sexual Activity   Alcohol use: No   Drug use: No   Sexual activity: Yes    Birth control/protection: Surgical  Other Topics Concern   Not on file  Social History Narrative   Lives with husband, dog. Work - Mudlogger. Step children. Hobbies - Quilting, gardening      Regular Exercise -  NO   Daily Caffeine Use:  1-2 cups  coffee, 1-2 soda/tea               Social Drivers of Health   Financial Resource Strain: Low Risk  (03/30/2023)   Overall Financial Resource Strain (CARDIA)    Difficulty of Paying Living Expenses: Not hard at all  Food Insecurity: No Food Insecurity (03/30/2023)   Hunger Vital Sign    Worried About Running Out of Food in the Last Year: Never true    Ran Out of Food in the Last Year: Never true  Transportation Needs: No Transportation Needs (03/30/2023)   PRAPARE - Transportation  Lack of Transportation (Medical): No    Lack of Transportation (Non-Medical): No  Physical Activity: Inactive (03/30/2023)   Exercise Vital Sign    Days of Exercise per Week: 0 days    Minutes of Exercise per Session: 0 min  Stress: No Stress Concern Present (03/30/2023)   [employer] of Occupational Health - Occupational Stress Questionnaire    Feeling of Stress : Not at all  Recent Concern: Stress - Stress Concern Present (03/12/2023)   [employer] of Occupational Health - Occupational Stress Questionnaire    Feeling of Stress : To some extent  Social Connections: Moderately Integrated (03/30/2023)   Social Connection and Isolation Panel [NHANES]    Frequency of Communication with Friends and Family: More than three times a week    Frequency of Social Gatherings with Friends and Family: Once a week    Attends Religious Services: More than 4 times per year    Active Member of [employer] or Organizations: No    Attends Banker Meetings: Never    Marital Status: Married  Recent Concern: Social Connections - Moderately Isolated (03/12/2023)   Social Connection and Isolation Panel [NHANES]    Frequency of Communication with Friends and Family: Once a week    Frequency of Social Gatherings with Friends and Family: Once a week    Attends Religious Services: More than 4 times per year    Active Member of [employer] or Organizations: No    Attends Engineer, Structural: Not on file     Marital Status: Married    Tobacco Counseling Counseling given: Not Answered   Clinical Intake:  Pre-visit preparation completed: Yes  Pain : 0-10 Pain Score: 5  Pain Type: Chronic pain Pain Location: Knee Pain Orientation: Right Pain Descriptors / Indicators: Nagging, Sharp Pain Onset: More than a month ago Pain Frequency: Constant     BMI - recorded: 34.72 Nutritional Status: BMI > 30  Obese Nutritional Risks: None Diabetes: Yes CBG done?: No Did pt. bring in CBG monitor from home?: No  How often do you need to have someone help you when you read instructions, pamphlets, or other written materials from your doctor or pharmacy?: 1 - Never  Interpreter Needed?: No  Information entered by :: R. Kellyn Mccary LPN   Activities of Daily Living    03/30/2023    3:03 PM  In your present state of health, do you have any difficulty performing the following activities:  Hearing? 0  Vision? 0  Comment glasses  Difficulty concentrating or making decisions? 0  Walking or climbing stairs? 1  Dressing or bathing? 0  Doing errands, shopping? 1  Preparing Food and eating ? N  Using the Toilet? N  In the past six months, have you accidently leaked urine? Y  Do you have problems with loss of bowel control? N  Managing your Medications? N  Managing your Finances? N  Housekeeping or managing your Housekeeping? N    Patient Care Team: Maribeth Camellia MATSU, MD as PCP - General (Family Medicine) Cleotilde Barrio, MD (Orthopedic Surgery) Vannie Delon LABOR, MD (Internal Medicine) Dessa Reyes ORN, MD (General Surgery)  Indicate any recent Medical Services you may have received from other than [facility] providers in the past year (date may be approximate).     Assessment:   This is a routine wellness examination for Nurah.  Hearing/Vision screen Hearing Screening - Comments:: No isses Vision Screening - Comments:: glasses   Goals Addressed  This Visit's Progress     Patient Stated       Wants to become more mobile after her knee replacement       Depression Screen    03/30/2023    3:09 PM 03/13/2023   10:13 AM 09/10/2022   10:12 AM 06/06/2022    1:58 PM 03/24/2022    9:17 AM 02/17/2022    8:55 AM 11/06/2021   11:27 AM  PHQ 2/9 Scores  PHQ - 2 Score 0 2 4 2 1 1  0  PHQ- 9 Score 1 4 11 6        Fall Risk    03/30/2023    3:05 PM 03/13/2023   10:12 AM 09/10/2022   10:11 AM 06/06/2022    1:58 PM 03/24/2022    9:16 AM  Fall Risk   Falls in the past year? 1 1 0 0 0  Number falls in past yr: 0 0 0 0 0  Injury with Fall? 0 0 0 0 0  Risk for fall due to : History of fall(s);Impaired balance/gait History of fall(s) No Fall Risks No Fall Risks   Follow up Falls evaluation completed;Falls prevention discussed Falls evaluation completed Falls evaluation completed Falls evaluation completed Falls evaluation completed;Falls prevention discussed    MEDICARE RISK AT HOME: Medicare Risk at Home Any stairs in or around the home?: Yes If so, are there any without handrails?: No Home free of loose throw rugs in walkways, pet beds, electrical cords, etc?: Yes Adequate lighting in your home to reduce risk of falls?: Yes Life alert?: No Use of a cane, walker or w/c?: Yes Grab bars in the bathroom?: Yes Shower chair or bench in shower?: Yes Elevated toilet seat or a handicapped toilet?: Yes   Cognitive Function:        03/30/2023    3:15 PM  6CIT Screen  What Year? 0 points  What month? 0 points  What time? 0 points  Count back from 20 0 points  Months in reverse 0 points  Repeat phrase 0 points  Total Score 0 points    Immunizations Immunization History  Administered Date(s) Administered   Influenza Split 01/19/2011, 01/10/2013   Influenza-Unspecified 12/27/2011   PNEUMOCOCCAL CONJUGATE-20 11/06/2021   Pneumococcal Polysaccharide-23 12/14/2017   Td 01/08/2022   Tdap 09/11/2009, 07/19/2010   Zoster Recombinant(Shingrix) 12/27/2021, 04/02/2022    Zoster, Live 03/18/2007, 08/02/2011    TDAP status: Up to date  Flu Vaccine status: Declined, Education has been provided regarding the importance of this vaccine but patient still declined. Advised may receive this vaccine at local pharmacy or Health Dept. Aware to provide a copy of the vaccination record if obtained from local pharmacy or Health Dept. Verbalized acceptance and understanding.  Pneumococcal vaccine status: Up to date  Covid-19 vaccine status: Declined, Education has been provided regarding the importance of this vaccine but patient still declined. Advised may receive this vaccine at local pharmacy or Health Dept.or vaccine clinic. Aware to provide a copy of the vaccination record if obtained from local pharmacy or Health Dept. Verbalized acceptance and understanding.  Qualifies for Shingles Vaccine? Yes   Zostavax completed No   Shingrix Completed?: Yes  Screening Tests Health Maintenance  Topic Date Due   Medicare Annual Wellness (AWV)  03/25/2023   Colonoscopy  07/12/2023   INFLUENZA VACCINE  06/15/2023 (Originally 10/16/2022)   MAMMOGRAM  04/20/2023   FOOT EXAM  09/10/2023   HEMOGLOBIN A1C  09/11/2023   OPHTHALMOLOGY EXAM  10/16/2023   Diabetic  kidney evaluation - eGFR measurement  03/12/2024   Diabetic kidney evaluation - Urine ACR  03/12/2024   DTaP/Tdap/Td (4 - Td or Tdap) 01/09/2032   Pneumonia Vaccine 43+ Years old  Completed   DEXA SCAN  Completed   Hepatitis C Screening  Completed   Zoster Vaccines- Shingrix  Completed   HPV VACCINES  Aged Out   COVID-19 Vaccine  Discontinued    Health Maintenance  Health Maintenance Due  Topic Date Due   Medicare Annual Wellness (AWV)  03/25/2023   Colonoscopy  07/12/2023    Colorectal cancer screening: Type of screening: Colonoscopy. Completed 06/2020. Repeat every 3 years  Mammogram status: Completed 2024 per patient. Repeat every year  Bone Density status: Completed 10/2021. Results reflect: Bone density  results: OSTEOPENIA. Repeat every 2 years.  Lung Cancer Screening: (Low Dose CT Chest recommended if Age 49-80 years, 20 pack-year currently smoking OR have quit w/in 15years.) does not qualify.    Additional Screening:  Hepatitis C Screening: does qualify; Completed 07/2017  Vision Screening: Recommended annual ophthalmology exams for early detection of glaucoma and other disorders of the eye. Is the patient up to date with their annual eye exam?  Yes  Who is the provider or what is the name of the office in which the patient attends annual eye exams? [facility] If pt is not established with a provider, would they like to be referred to a provider to establish care? No .   Dental Screening: Recommended annual dental exams for proper oral hygiene  Diabetic Foot Exam: Diabetic Foot Exam: Completed 08/2022  Community Resource Referral / Chronic Care Management: CRR required this visit?  No   CCM required this visit?  No     Plan:     I have personally reviewed and noted the following in the patient's chart:   Medical and social history Use of alcohol, tobacco or illicit drugs  Current medications and supplements including opioid prescriptions. Patient is not currently taking opioid prescriptions. Functional ability and status Nutritional status Physical activity Advanced directives List of other physicians Hospitalizations, surgeries, and ER visits in previous 12 months Vitals Screenings to include cognitive, depression, and falls Referrals and appointments  In addition, I have reviewed and discussed with patient certain preventive protocols, quality metrics, and best practice recommendations. A written personalized care plan for preventive services as well as general preventive health recommendations were provided to patient.     Angeline Fredericks, LPN   [id number]/[id number]   After Visit Summary: (MyChart) Due to this being a telephonic visit, the after visit summary with patients  personalized plan was offered to patient via MyChart   Nurse Notes: None

## 2023-03-30 NOTE — Patient Instructions (Signed)
 Shelby Estes , Thank you for taking time to come for your Medicare Wellness Visit. I appreciate your ongoing commitment to your health goals. Please review the following plan we discussed and let me know if I can assist you in the future.   Referrals/Orders/Follow-Ups/Clinician Recommendations: None  This is a list of the screening recommended for you and due dates:  Health Maintenance  Topic Date Due   Mammogram  04/19/2022   Colon Cancer Screening  07/12/2023   Flu Shot  06/15/2023*   Complete foot exam   09/10/2023   Hemoglobin A1C  09/11/2023   Eye exam for diabetics  10/16/2023   Yearly kidney function blood test for diabetes  03/12/2024   Yearly kidney health urinalysis for diabetes  03/12/2024   Medicare Annual Wellness Visit  03/29/2024   DTaP/Tdap/Td vaccine (4 - Td or Tdap) 01/09/2032   Pneumonia Vaccine  Completed   DEXA scan (bone density measurement)  Completed   Hepatitis C Screening  Completed   Zoster (Shingles) Vaccine  Completed   HPV Vaccine  Aged Out   COVID-19 Vaccine  Discontinued  *Topic was postponed. The date shown is not the original due date.    Advanced directives: (Declined) Advance directive discussed with you today. Even though you declined this today, please call our office should you change your mind, and we can give you the proper paperwork for you to fill out.  Next Medicare Annual Wellness Visit scheduled for next year: Yes 03/30/24 @ 2:20

## 2023-03-30 NOTE — Progress Notes (Signed)
 Pt presented for their vitamin B12 injection. Pt was identified through two identifiers. Pt tolerated shot well in their left deltoid.

## 2023-03-31 DIAGNOSIS — K080 Exfoliation of teeth due to systemic causes: Secondary | ICD-10-CM | POA: Diagnosis not present

## 2023-04-02 ENCOUNTER — Telehealth: Payer: Self-pay | Admitting: *Deleted

## 2023-04-02 DIAGNOSIS — M1711 Unilateral primary osteoarthritis, right knee: Secondary | ICD-10-CM | POA: Diagnosis not present

## 2023-04-02 DIAGNOSIS — K080 Exfoliation of teeth due to systemic causes: Secondary | ICD-10-CM | POA: Diagnosis not present

## 2023-04-02 NOTE — Telephone Encounter (Signed)
I don't recall getting a clearance form. Please look in my in basket and if it is in my in basket please place on my keyboard to look at Friday.

## 2023-04-02 NOTE — Telephone Encounter (Signed)
Copied from CRM [id number]. Topic: General - Other >> Apr 02, 2023 11:36 AM Corin V wrote: Reason for CRM: [facility]elle Darting called to request labs be faxed over from 03/13/23. They also stated that they are still waiting on a surgery clearance form to be returned for the patient. Patient is scheduled to have surgery on Monday. Please verify if form was received and complete it to fax back as soon as possible so surgery does not get delayed. Fax: [phone]

## 2023-04-03 DIAGNOSIS — Z01818 Encounter for other preprocedural examination: Secondary | ICD-10-CM | POA: Diagnosis not present

## 2023-04-03 NOTE — Telephone Encounter (Signed)
Form filled out. Patient recently seen for follow-up. She saw cardiology the day after our visit for cardiac clearance.  Medically she is low risk.  Please fax the form to the surgeons office.  Thanks.

## 2023-04-03 NOTE — Telephone Encounter (Signed)
Form printed and placed on desk as requested

## 2023-04-06 ENCOUNTER — Encounter: Payer: Self-pay | Admitting: Family Medicine

## 2023-04-07 DIAGNOSIS — Z7985 Long-term (current) use of injectable non-insulin antidiabetic drugs: Secondary | ICD-10-CM | POA: Diagnosis not present

## 2023-04-07 DIAGNOSIS — Z7982 Long term (current) use of aspirin: Secondary | ICD-10-CM | POA: Diagnosis not present

## 2023-04-07 DIAGNOSIS — M1711 Unilateral primary osteoarthritis, right knee: Secondary | ICD-10-CM | POA: Diagnosis not present

## 2023-04-07 DIAGNOSIS — Z7984 Long term (current) use of oral hypoglycemic drugs: Secondary | ICD-10-CM | POA: Diagnosis not present

## 2023-04-07 DIAGNOSIS — M25761 Osteophyte, right knee: Secondary | ICD-10-CM | POA: Diagnosis not present

## 2023-04-07 HISTORY — PX: REPLACEMENT TOTAL KNEE: SUR1224

## 2023-04-08 DIAGNOSIS — M1711 Unilateral primary osteoarthritis, right knee: Secondary | ICD-10-CM | POA: Diagnosis not present

## 2023-04-08 DIAGNOSIS — Z7984 Long term (current) use of oral hypoglycemic drugs: Secondary | ICD-10-CM | POA: Diagnosis not present

## 2023-04-08 DIAGNOSIS — Z7985 Long-term (current) use of injectable non-insulin antidiabetic drugs: Secondary | ICD-10-CM | POA: Diagnosis not present

## 2023-04-08 DIAGNOSIS — Z7982 Long term (current) use of aspirin: Secondary | ICD-10-CM | POA: Diagnosis not present

## 2023-04-08 DIAGNOSIS — M25761 Osteophyte, right knee: Secondary | ICD-10-CM | POA: Diagnosis not present

## 2023-04-09 NOTE — Telephone Encounter (Signed)
Clearance was faxed on 04/03/23

## 2023-04-11 ENCOUNTER — Other Ambulatory Visit: Payer: Self-pay | Admitting: Family Medicine

## 2023-04-13 ENCOUNTER — Ambulatory Visit: Payer: Medicare Other

## 2023-04-13 DIAGNOSIS — M1711 Unilateral primary osteoarthritis, right knee: Secondary | ICD-10-CM | POA: Diagnosis not present

## 2023-04-17 DIAGNOSIS — M1711 Unilateral primary osteoarthritis, right knee: Secondary | ICD-10-CM | POA: Diagnosis not present

## 2023-04-20 ENCOUNTER — Other Ambulatory Visit: Payer: Self-pay | Admitting: Cardiovascular Disease

## 2023-04-20 DIAGNOSIS — M1711 Unilateral primary osteoarthritis, right knee: Secondary | ICD-10-CM | POA: Diagnosis not present

## 2023-04-21 DIAGNOSIS — M1711 Unilateral primary osteoarthritis, right knee: Secondary | ICD-10-CM | POA: Diagnosis not present

## 2023-04-28 DIAGNOSIS — M1711 Unilateral primary osteoarthritis, right knee: Secondary | ICD-10-CM | POA: Diagnosis not present

## 2023-04-30 DIAGNOSIS — M1711 Unilateral primary osteoarthritis, right knee: Secondary | ICD-10-CM | POA: Diagnosis not present

## 2023-05-05 ENCOUNTER — Other Ambulatory Visit: Payer: Self-pay | Admitting: Family Medicine

## 2023-05-05 DIAGNOSIS — I10 Essential (primary) hypertension: Secondary | ICD-10-CM

## 2023-05-05 DIAGNOSIS — M1711 Unilateral primary osteoarthritis, right knee: Secondary | ICD-10-CM | POA: Diagnosis not present

## 2023-05-13 DIAGNOSIS — M1711 Unilateral primary osteoarthritis, right knee: Secondary | ICD-10-CM | POA: Diagnosis not present

## 2023-05-14 ENCOUNTER — Other Ambulatory Visit: Payer: Self-pay | Admitting: Family Medicine

## 2023-05-14 DIAGNOSIS — E1169 Type 2 diabetes mellitus with other specified complication: Secondary | ICD-10-CM

## 2023-05-14 NOTE — Telephone Encounter (Signed)
 Copied from CRM [id number]. Topic: Clinical - Medication Refill >> May 14, 2023  2:25 PM Isabell A wrote: Most Recent Primary Care Visit:  Provider: Donavan Foil  Department: [facility]  Visit Type: CLINICAL SUPPORT  Date: 03/30/2023  Medication: tirzepatide Greggory Keen) 10 MG/0.5ML Pen  Has the patient contacted their pharmacy? Yes (Agent: If no, request that the patient contact the pharmacy for the refill. If patient does not wish to contact the pharmacy document the reason why and proceed with request.) (Agent: If yes, when and what did the pharmacy advise?)  Is this the correct pharmacy for this prescription? Yes If no, delete pharmacy and type the correct one.  This is the patient's preferred pharmacy:  [facility] [address], [place], [place] [address]   Has the prescription been filled recently? Yes  Is the patient out of the medication? No  Has the patient been seen for an appointment in the last year OR does the patient have an upcoming appointment? Yes  Can we respond through MyChart? No  Agent: Please be advised that Rx refills may take up to 3 business days. We ask that you follow-up with your pharmacy.

## 2023-05-15 ENCOUNTER — Telehealth: Payer: Self-pay

## 2023-05-15 DIAGNOSIS — M1711 Unilateral primary osteoarthritis, right knee: Secondary | ICD-10-CM | POA: Diagnosis not present

## 2023-05-15 MED ORDER — MOUNJARO 10 MG/0.5ML ~~LOC~~ SOAJ: 10.0000 mg | SUBCUTANEOUS | 3 refills | Status: DC

## 2023-05-15 NOTE — Telephone Encounter (Signed)
 Pt needs a TOC appt with a new provider.

## 2023-05-25 DIAGNOSIS — M1711 Unilateral primary osteoarthritis, right knee: Secondary | ICD-10-CM | POA: Diagnosis not present

## 2023-05-28 DIAGNOSIS — M1711 Unilateral primary osteoarthritis, right knee: Secondary | ICD-10-CM | POA: Diagnosis not present

## 2023-05-29 ENCOUNTER — Telehealth: Payer: Self-pay | Admitting: Family Medicine

## 2023-05-29 NOTE — Telephone Encounter (Signed)
 Dr Birdie Sons is no longer at this location. Please call the office to schedule a Transfer of Care to either Dr Charlann Lange, Darleen Crocker or Kara Dies, NP. [id number] please schedule a TOC visit for this patient. [facility]   Thank you

## 2023-06-04 DIAGNOSIS — Z01818 Encounter for other preprocedural examination: Secondary | ICD-10-CM | POA: Diagnosis not present

## 2023-06-11 ENCOUNTER — Ambulatory Visit: Payer: Medicare Other | Admitting: Cardiovascular Disease

## 2023-06-11 ENCOUNTER — Other Ambulatory Visit: Payer: Self-pay

## 2023-06-11 ENCOUNTER — Encounter: Payer: Self-pay | Admitting: Cardiovascular Disease

## 2023-06-11 VITALS — BP 127/74 | HR 96 | Ht 63.0 in | Wt 189.0 lb

## 2023-06-11 DIAGNOSIS — I340 Nonrheumatic mitral (valve) insufficiency: Secondary | ICD-10-CM

## 2023-06-11 DIAGNOSIS — Z01810 Encounter for preprocedural cardiovascular examination: Secondary | ICD-10-CM

## 2023-06-11 DIAGNOSIS — I10 Essential (primary) hypertension: Secondary | ICD-10-CM | POA: Diagnosis not present

## 2023-06-11 DIAGNOSIS — R0789 Other chest pain: Secondary | ICD-10-CM | POA: Diagnosis not present

## 2023-06-11 DIAGNOSIS — G4733 Obstructive sleep apnea (adult) (pediatric): Secondary | ICD-10-CM | POA: Diagnosis not present

## 2023-06-11 DIAGNOSIS — K219 Gastro-esophageal reflux disease without esophagitis: Secondary | ICD-10-CM

## 2023-06-11 MED ORDER — AMLODIPINE BESYLATE 5 MG PO TABS: 5.0000 mg | ORAL_TABLET | Freq: Every day | ORAL | 0 refills | Status: DC

## 2023-06-11 NOTE — Progress Notes (Signed)
 Cardiology Office Note   Date:  06/11/2023   ID:  Shelby Estes, DOB May 19, 1955, MRN [id number]  PCP:  No primary care provider on file.  Cardiologist:  Adrian Blackwater, MD      History of Present Illness: Shelby Estes is a 68 y.o. female who presents for  Chief Complaint  Patient presents with   Follow-up    3 Month follow up   Pt needs 'clearance stated' in office notes to be faxed to [facility]    Having second knee replacement.      Past Medical History:  Diagnosis Date   Anxiety    Depression    Diabetes mellitus 2010   Elevated BP    GERD (gastroesophageal reflux disease)    Hypertension    Kidney stone    Dr. Dallas Schimke, April 2014   Renal cyst    Dr. Daniels-urologist   SVT (supraventricular tachycardia) [facility])    s/p cardioversion   Vitamin D deficiency      Past Surgical History:  Procedure Laterality Date   ABDOMINAL HYSTERECTOMY  09/27/13   Dr. Veatrice Kells   Arm Surgery     due to fracture left elbow   CHOLECYSTECTOMY  5/07   Dr Lemar Livings   COLONOSCOPY  2007   Dr Lemar Livings   COLONOSCOPY WITH PROPOFOL N/A 06/20/2015   Procedure: COLONOSCOPY WITH PROPOFOL;  Surgeon: Earline Mayotte, MD;  Location: [facility] ENDOSCOPY;  Service: Endoscopy;  Laterality: N/A;   TUBAL LIGATION  02-21-01     Current Outpatient Medications  Medication Sig Dispense Refill   ADULT ASPIRIN REGIMEN 81 MG EC tablet      Alcohol Swabs (B-D SINGLE USE SWABS REGULAR) PADS Use daily to clean skin prior to testing/injection 100 each 3   amLODipine (NORVASC) 5 MG tablet TAKE ONE TABLET BY MOUTH DAILY 30 tablet 0   Blood Glucose Monitoring Suppl (TRUE METRIX AIR GLUCOSE METER) w/Device KIT 1 each by Does not apply route daily. 1 kit 0   Cholecalciferol (VITAMIN D) 2000 UNITS CAPS Take 1 capsule by mouth daily.     colchicine 0.6 MG tablet Take 0.6 mg by mouth 2 (two) times daily.     DULoxetine (CYMBALTA) 30 MG capsule TAKE 2 CAPSULES BY MOUTH EVERY DAY AS DIRECTED 180  capsule 1   fluticasone (FLONASE) 50 MCG/ACT nasal spray Place 2 sprays into both nostrils daily. (Patient taking differently: Place 2 sprays into both nostrils daily. As needed) 16 g 6   glucose blood (GNP TRUE METRIX GLUCOSE STRIPS) test strip Check once daily. 100 each 12   JARDIANCE 25 MG TABS tablet TAKE ONE TABLET EACH MORNING BEFORE BREAKFAST 90 tablet 1   linaclotide (LINZESS) 72 MCG capsule Take 72 mcg by mouth daily before breakfast.     losartan (COZAAR) 100 MG tablet TAKE ONE TABLET BY MOUTH EVERY DAY ALONGWITH HCTZ 90 tablet 1   meloxicam (MOBIC) 15 MG tablet Take 15 mg by mouth daily.      metFORMIN (GLUCOPHAGE) 1000 MG tablet TAKE 1/2 TABLET BY MOUTH TWICE DAILY WITH A MEAL 180 tablet 1   metoprolol tartrate (LOPRESSOR) 25 MG tablet TAKE 1 TABLET BY MOUTH TWICE DAILY 180 tablet 0   pantoprazole (PROTONIX) 40 MG tablet TAKE ONE TABLET BY MOUTH TWICE DAILY BEFORE MEALS 60 tablet 3   polyethylene glycol powder (GLYCOLAX/MIRALAX) 17 GM/SCOOP powder Take 17 g by mouth daily as needed for mild constipation. 500 g 0   potassium chloride SA (KLOR-CON M)  20 MEQ tablet Take 2 tablets (40 mEq total) by mouth daily for 3 days. (Patient not taking: Reported on 03/13/2023) 6 tablet 0   rosuvastatin (CRESTOR) 10 MG tablet TAKE 1 TABLET BY MOUTH ONCE DAILY 90 tablet 3   tirzepatide (MOUNJARO) 10 MG/0.5ML Pen Inject 10 mg into the skin once a week. 12 mL 3   TRUEplus Lancets 30G MISC TEST BLOOD SUGAR twice DAILY 200 each 3   No current facility-administered medications for this visit.    Allergies:   Bupropion, Clarithromycin, Enalapril, Phentermine, Ceclor [cefaclor], Penicillins, and Sulfa antibiotics    Social History:   reports that she has never smoked. She has never used smokeless tobacco. She reports that she does not drink alcohol and does not use drugs.   Family History:  family history includes Breast cancer in her maternal aunt and another family member; COPD in her father; Cancer in  an other family member; Colon cancer in her cousin and sister; Diabetes in her mother; Hypertension in her mother; Seizures in her maternal grandmother; Stroke in her father; Thyroid disease in her mother.    ROS:     Review of Systems  Constitutional: Negative.   HENT: Negative.    Eyes: Negative.   Respiratory: Negative.    Gastrointestinal: Negative.   Genitourinary: Negative.   Musculoskeletal: Negative.   Skin: Negative.   Neurological: Negative.   Endo/Heme/Allergies: Negative.   Psychiatric/Behavioral: Negative.    All other systems reviewed and are negative.     All other systems are reviewed and negative.    PHYSICAL EXAM: VS:  BP 127/74   Pulse 96   Ht 5\' 3"  (1.6 m)   Wt 189 lb (85.7 kg)   SpO2 98%   BMI 33.48 kg/m  , BMI Body mass index is 33.48 kg/m. Last weight:  Wt Readings from Last 3 Encounters:  06/11/23 189 lb (85.7 kg)  03/30/23 196 lb (88.9 kg)  03/13/23 200 lb 3.2 oz (90.8 kg)     Physical Exam    EKG:   Recent Labs: 09/10/2022: ALT 8 03/13/2023: BUN 14; Creatinine, Ser 0.86; Hemoglobin 12.3; Platelets 445.0; Potassium 3.4; Sodium 141    Lipid Panel    Component Value Date/Time   CHOL 107 06/06/2022 1402   TRIG 146 06/06/2022 1402   HDL 46 06/06/2022 1402   CHOLHDL 2.3 06/06/2022 1402   CHOLHDL 3 05/03/2021 1037   VLDL 43.4 (H) 05/03/2021 1037   LDLCALC 36 06/06/2022 1402   LDLDIRECT 50.0 05/03/2021 1037      Other studies Reviewed: Additional studies/ records that were reviewed today include:  Review of the above records demonstrates:       No data to display            ASSESSMENT AND PLAN:    ICD-10-CM   1. Chest pain, non-cardiac  R07.89    no chest pain work up negative.    2. Primary hypertension  I10    normal    3. Nonrheumatic mitral valve regurgitation  I34.0     4. OSA on CPAP  G47.33     5. Gastroesophageal reflux disease, unspecified whether esophagitis present  K21.9     6. Pre-operative  cardiovascular examination  Z01.810    stable, proceed with second Knee replacement.       Problem List Items Addressed This Visit       Cardiovascular and Mediastinum   Hypertension (Chronic)     Respiratory   OSA on CPAP (  Chronic)     Digestive   GERD (gastroesophageal reflux disease)   Other Visit Diagnoses       Chest pain, non-cardiac    -  Primary   no chest pain work up negative.     Nonrheumatic mitral valve regurgitation         Pre-operative cardiovascular examination       stable, proceed with second Knee replacement.          Disposition:   Return in about 3 months (around 09/11/2023).    Total time spent: 30 minutes  Signed,  Adrian Blackwater, MD  06/11/2023 10:31 AM    [facility]

## 2023-06-16 DIAGNOSIS — F418 Other specified anxiety disorders: Secondary | ICD-10-CM | POA: Diagnosis not present

## 2023-06-16 DIAGNOSIS — K219 Gastro-esophageal reflux disease without esophagitis: Secondary | ICD-10-CM | POA: Diagnosis not present

## 2023-06-16 DIAGNOSIS — Z96651 Presence of right artificial knee joint: Secondary | ICD-10-CM | POA: Diagnosis not present

## 2023-06-16 DIAGNOSIS — E7800 Pure hypercholesterolemia, unspecified: Secondary | ICD-10-CM | POA: Diagnosis not present

## 2023-06-16 DIAGNOSIS — M1712 Unilateral primary osteoarthritis, left knee: Secondary | ICD-10-CM | POA: Diagnosis not present

## 2023-06-16 DIAGNOSIS — G8918 Other acute postprocedural pain: Secondary | ICD-10-CM | POA: Diagnosis not present

## 2023-06-16 DIAGNOSIS — I10 Essential (primary) hypertension: Secondary | ICD-10-CM | POA: Diagnosis not present

## 2023-06-16 DIAGNOSIS — E119 Type 2 diabetes mellitus without complications: Secondary | ICD-10-CM | POA: Diagnosis not present

## 2023-06-16 DIAGNOSIS — Z7985 Long-term (current) use of injectable non-insulin antidiabetic drugs: Secondary | ICD-10-CM | POA: Diagnosis not present

## 2023-06-16 DIAGNOSIS — K449 Diaphragmatic hernia without obstruction or gangrene: Secondary | ICD-10-CM | POA: Diagnosis not present

## 2023-06-16 DIAGNOSIS — Z7982 Long term (current) use of aspirin: Secondary | ICD-10-CM | POA: Diagnosis not present

## 2023-06-16 DIAGNOSIS — Z7984 Long term (current) use of oral hypoglycemic drugs: Secondary | ICD-10-CM | POA: Diagnosis not present

## 2023-06-16 HISTORY — PX: REPLACEMENT TOTAL KNEE: SUR1224

## 2023-06-17 DIAGNOSIS — K449 Diaphragmatic hernia without obstruction or gangrene: Secondary | ICD-10-CM | POA: Diagnosis not present

## 2023-06-17 DIAGNOSIS — M1712 Unilateral primary osteoarthritis, left knee: Secondary | ICD-10-CM | POA: Diagnosis not present

## 2023-06-17 DIAGNOSIS — I10 Essential (primary) hypertension: Secondary | ICD-10-CM | POA: Diagnosis not present

## 2023-06-17 DIAGNOSIS — K219 Gastro-esophageal reflux disease without esophagitis: Secondary | ICD-10-CM | POA: Diagnosis not present

## 2023-06-17 DIAGNOSIS — Z7984 Long term (current) use of oral hypoglycemic drugs: Secondary | ICD-10-CM | POA: Diagnosis not present

## 2023-06-17 DIAGNOSIS — F418 Other specified anxiety disorders: Secondary | ICD-10-CM | POA: Diagnosis not present

## 2023-06-17 DIAGNOSIS — Z7982 Long term (current) use of aspirin: Secondary | ICD-10-CM | POA: Diagnosis not present

## 2023-06-17 DIAGNOSIS — E7800 Pure hypercholesterolemia, unspecified: Secondary | ICD-10-CM | POA: Diagnosis not present

## 2023-06-17 DIAGNOSIS — E119 Type 2 diabetes mellitus without complications: Secondary | ICD-10-CM | POA: Diagnosis not present

## 2023-06-17 DIAGNOSIS — Z7985 Long-term (current) use of injectable non-insulin antidiabetic drugs: Secondary | ICD-10-CM | POA: Diagnosis not present

## 2023-06-17 DIAGNOSIS — Z96651 Presence of right artificial knee joint: Secondary | ICD-10-CM | POA: Diagnosis not present

## 2023-06-19 ENCOUNTER — Other Ambulatory Visit: Payer: Self-pay

## 2023-06-19 DIAGNOSIS — E1169 Type 2 diabetes mellitus with other specified complication: Secondary | ICD-10-CM

## 2023-06-19 MED ORDER — METFORMIN HCL 500 MG PO TABS: 500.0000 mg | ORAL_TABLET | Freq: Two times a day (BID) | ORAL | 3 refills | Status: DC

## 2023-06-19 NOTE — Telephone Encounter (Signed)
 Please call pt and check if she is taking metformin 500 mg twice a day.

## 2023-06-19 NOTE — Telephone Encounter (Signed)
 Metoprolol is refilled by cardiology Dr. Adrian Blackwater. Please send to cardiology. We can refill metformin.

## 2023-06-19 NOTE — Telephone Encounter (Signed)
 Please change to 500 mg twice a day.

## 2023-06-19 NOTE — Telephone Encounter (Signed)
 Diagnosis code needed, unable to pend reorder.

## 2023-06-19 NOTE — Telephone Encounter (Signed)
 Copied from CRM [id number]. Topic: Clinical - Medication Refill >> Jun 19, 2023  9:50 AM Shelah Lewandowsky wrote: Most Recent Primary Care Visit:  Provider: Donavan Foil  Department: [facility]  Visit Type: CLINICAL SUPPORT  Date: 03/30/2023  Medication: metFORMIN (GLUCOPHAGE) 1000 MG tablet  Has the patient contacted their pharmacy? Yes (Agent: If no, request that the patient contact the pharmacy for the refill. If patient does not wish to contact the pharmacy document the reason why and proceed with request.) (Agent: If yes, when and what did the pharmacy advise?)  Is this the correct pharmacy for this prescription? Yes If no, delete pharmacy and type the correct one.  This is the patient's preferred pharmacy:   [facility] - [place], [place] - [address] [place] [place] [address] Phone: [phone] Fax: [phone]   Has the prescription been filled recently? Yes  Is the patient out of the medication? Yes  Has the patient been seen for an appointment in the last year OR does the patient have an upcoming appointment? Yes  Can we respond through MyChart? Yes  Agent: Please be advised that Rx refills may take up to 3 business days. We ask that you follow-up with your pharmacy.

## 2023-06-22 ENCOUNTER — Encounter: Payer: Self-pay | Admitting: Cardiovascular Disease

## 2023-06-23 DIAGNOSIS — Z4889 Encounter for other specified surgical aftercare: Secondary | ICD-10-CM | POA: Diagnosis not present

## 2023-06-26 DIAGNOSIS — M1712 Unilateral primary osteoarthritis, left knee: Secondary | ICD-10-CM | POA: Diagnosis not present

## 2023-06-29 DIAGNOSIS — M1712 Unilateral primary osteoarthritis, left knee: Secondary | ICD-10-CM | POA: Diagnosis not present

## 2023-07-01 DIAGNOSIS — Z96652 Presence of left artificial knee joint: Secondary | ICD-10-CM | POA: Diagnosis not present

## 2023-07-07 DIAGNOSIS — M1712 Unilateral primary osteoarthritis, left knee: Secondary | ICD-10-CM | POA: Diagnosis not present

## 2023-07-09 DIAGNOSIS — M1712 Unilateral primary osteoarthritis, left knee: Secondary | ICD-10-CM | POA: Diagnosis not present

## 2023-07-13 DIAGNOSIS — M1712 Unilateral primary osteoarthritis, left knee: Secondary | ICD-10-CM | POA: Diagnosis not present

## 2023-07-23 DIAGNOSIS — M1712 Unilateral primary osteoarthritis, left knee: Secondary | ICD-10-CM | POA: Diagnosis not present

## 2023-07-29 ENCOUNTER — Other Ambulatory Visit: Payer: Self-pay | Admitting: Cardiovascular Disease

## 2023-07-29 ENCOUNTER — Other Ambulatory Visit: Payer: Self-pay | Admitting: Family Medicine

## 2023-07-29 NOTE — Telephone Encounter (Unsigned)
 Copied from CRM [id number]. Topic: Clinical - Medication Refill >> Jul 29, 2023  3:54 PM Shereese L wrote: Medication: DULoxetine  (CYMBALTA ) 30 MG capsule  Has the patient contacted their pharmacy? Yes (Agent: If no, request that the patient contact the pharmacy for the refill. If patient does not wish to contact the pharmacy document the reason why and proceed with request.) (Agent: If yes, when and what did the pharmacy advise?)  This is the patient's preferred pharmacy:   [facility] - [place], [place] - [address] [place] [address] Phone: [phone] Fax: [phone]   Is this the correct pharmacy for this prescription? Yes If no, delete pharmacy and type the correct one.   Has the prescription been filled recently? No  Is the patient out of the medication? Yes  Has the patient been seen for an appointment in the last year OR does the patient have an upcoming appointment? Yes  Can we respond through MyChart? Yes  Agent: Please be advised that Rx refills may take up to 3 business days. We ask that you follow-up with your pharmacy.

## 2023-08-04 ENCOUNTER — Telehealth: Payer: Self-pay

## 2023-08-04 MED ORDER — DULOXETINE HCL 30 MG PO CPEP: ORAL_CAPSULE | ORAL | 0 refills | Status: DC

## 2023-08-04 NOTE — Telephone Encounter (Signed)
 Copied from CRM [id number]. Topic: Clinical - Prescription Issue >> Aug 04, 2023 10:33 AM Albertha Alosa wrote: Reason for CRM: Patient called in regarding DULoxetine  (CYMBALTA ) 30 MG capsule,  stated she has requesting for this to be refilled and called the pharmacy and still was not there would like for it to be sent today Patient also stated she would like for someone to give her a callback on when it has been sent over to the pharmacy

## 2023-08-04 NOTE — Telephone Encounter (Signed)
 Medication has been refilled for 30 days and pt is aware.

## 2023-08-04 NOTE — Addendum Note (Signed)
 Addended by: Chadwick Colonel on: 08/04/2023 11:10 AM   Modules accepted: Orders

## 2023-08-05 DIAGNOSIS — M1712 Unilateral primary osteoarthritis, left knee: Secondary | ICD-10-CM | POA: Diagnosis not present

## 2023-08-07 DIAGNOSIS — M1712 Unilateral primary osteoarthritis, left knee: Secondary | ICD-10-CM | POA: Diagnosis not present

## 2023-08-11 ENCOUNTER — Ambulatory Visit (INDEPENDENT_AMBULATORY_CARE_PROVIDER_SITE_OTHER)

## 2023-08-11 VITALS — BP 126/82 | HR 82 | Temp 98.6°F | Ht 63.0 in | Wt 193.0 lb

## 2023-08-11 DIAGNOSIS — D75839 Thrombocytosis, unspecified: Secondary | ICD-10-CM

## 2023-08-11 DIAGNOSIS — E785 Hyperlipidemia, unspecified: Secondary | ICD-10-CM

## 2023-08-11 DIAGNOSIS — E1169 Type 2 diabetes mellitus with other specified complication: Secondary | ICD-10-CM

## 2023-08-11 DIAGNOSIS — I10 Essential (primary) hypertension: Secondary | ICD-10-CM | POA: Diagnosis not present

## 2023-08-11 DIAGNOSIS — D72829 Elevated white blood cell count, unspecified: Secondary | ICD-10-CM

## 2023-08-11 DIAGNOSIS — E669 Obesity, unspecified: Secondary | ICD-10-CM | POA: Diagnosis not present

## 2023-08-11 DIAGNOSIS — F32A Depression, unspecified: Secondary | ICD-10-CM

## 2023-08-11 DIAGNOSIS — E538 Deficiency of other specified B group vitamins: Secondary | ICD-10-CM

## 2023-08-11 DIAGNOSIS — K219 Gastro-esophageal reflux disease without esophagitis: Secondary | ICD-10-CM

## 2023-08-11 DIAGNOSIS — R911 Solitary pulmonary nodule: Secondary | ICD-10-CM

## 2023-08-11 DIAGNOSIS — F419 Anxiety disorder, unspecified: Secondary | ICD-10-CM

## 2023-08-11 DIAGNOSIS — N898 Other specified noninflammatory disorders of vagina: Secondary | ICD-10-CM

## 2023-08-11 DIAGNOSIS — Z800 Family history of malignant neoplasm of digestive organs: Secondary | ICD-10-CM

## 2023-08-11 HISTORY — DX: Other specified noninflammatory disorders of vagina: N89.8

## 2023-08-11 MED ORDER — EMPAGLIFLOZIN 25 MG PO TABS: 25.0000 mg | ORAL_TABLET | Freq: Every day | ORAL | 1 refills | Status: DC

## 2023-08-11 MED ORDER — MOUNJARO 10 MG/0.5ML ~~LOC~~ SOAJ: 10.0000 mg | SUBCUTANEOUS | 3 refills | Status: DC

## 2023-08-11 MED ORDER — PANTOPRAZOLE SODIUM 40 MG PO TBEC
DELAYED_RELEASE_TABLET | ORAL | 3 refills | Status: AC
Start: 2023-08-11 — End: ?

## 2023-08-11 MED ORDER — ROSUVASTATIN CALCIUM 10 MG PO TABS: 10.0000 mg | ORAL_TABLET | Freq: Every day | ORAL | 3 refills | Status: AC

## 2023-08-11 MED ORDER — DULOXETINE HCL 30 MG PO CPEP: ORAL_CAPSULE | ORAL | 1 refills | Status: DC

## 2023-08-11 NOTE — Assessment & Plan Note (Signed)
 Recheck CBC with differential. If worsened from last lab we discussed potential referring patient to heme/onc for further evaluation. If stays stable will continue to f/u on CBC every 6 months.

## 2023-08-11 NOTE — Assessment & Plan Note (Signed)
 Chronic issue.  Adequately controlled.  She will continue Protonix  40 mg daily before meals.

## 2023-08-11 NOTE — Assessment & Plan Note (Signed)
 Continue, Crestor  10 mg daily, last LDL within goal

## 2023-08-11 NOTE — Assessment & Plan Note (Signed)
 Chronic issue.  Goal BP <130/80 mmHg. Diastolic blood pressure slightly above goal.  She is only taking Metoprolol  tartrate 25 mg once a day. I counseled on goal BP, patient wanting to see her cardiologist Dr. Meredeth Stallion with whom she has an appointment later this week before she starts Losartan, Amlodipine .

## 2023-08-11 NOTE — Progress Notes (Signed)
 Established patient of Dr. Lovetta Rucks presenting for transfer of care. Last OV with him was on 03/13/2023.    Subjective  Patient ID: Shelby Estes, female    DOB: 1955-04-01  Age: 68 y.o. 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  HPI 1) DM II:  Home BG: Ranging 85-125 in the mornings. Checking once a day.  On: Jardiance  25 mg, metformin  500 mg twice a day, mounjaro  10mg /0.5 ml. Patient reports of vaginal thick white discharge. She was treated for vaginal yeast infection when she had knee surgery. Hypoglycemic symptoms- no Eye exam: UDT, [facility] Urine microalbumin: Cr: Normal on 03/13/2023. LDL: 06/06/22, LDL 36  Last A1c:  03/13/2023, 6.1% Diet: Low carbohydrate diet.    2) B 12 deficiency: Used to get B12 injection in the past but has not since getting knee surgery. She had right knee surgery on January 21/2025, left 06/16/23. She has been feeling better since knee surgery and is active. Last lab 06/06/2022, was normal. She has not noted change in symptoms since being off B 12 intramuscular injection.   3) Lung nodule: Stable right upper lobe nodule measuring about 8 mm, last CT lungs done on 03/26/23. Repeat after 03/25/24.   4) Vitamin D  deficiency: Taking 2000 units over the counter.   5) Leukocytosis, thrombocytosis: last CBC was done on 03/13/2023. Has seen by heme/onc back in 2020 (Dr. Beverely Buba). Patient denies unintentional weight loss, fever, chills, night sweats.   6) Hypokalemia: Last K was 3.4 on 03/13/2023.   7) Mixed hyperlipidemia:   On Rosuvastatin  10 mg.  8) Home BP ranging:  Appointment with cardiologist Dr. Meredeth Stallion this week. Only taking Metoprolol  tartrate 25 mg 130/80. She has Losartan 100 mg Amlodipine  5 mg prescribed to her. She had episodes of low BP post knee surgery and plans on waiting till her appointment with cardiologist before restarting medication.   9) OSA: Managed by her cardiologist, uses CPAP  anywhere from 7-10 hours a night.   10) Mood stable on Duloxetine  60 mg once a day. Has considered tapering down the dose.   11) Colorectal cancer screening:  Has colonoscopy scheduled for 09/17/23 with [facility]  12) GERD: On Protonix  40 mg once a day.   13) Gout: When symptoms flares up takes Colchicine 0.6 mg prn.   ROS As per HPI    Objective:      BP 126/82   Pulse 82   Temp 98.6 F (37 C) (Oral)   Ht 5\' 3"  (1.6 m)   Wt 193 lb (87.5 kg)   SpO2 99%   BMI 34.19 kg/m      08/11/2023    3:10 PM 03/30/2023    3:09 PM 03/13/2023   10:13 AM  Depression screen PHQ 2/9  Decreased Interest 0 0 1  Down, Depressed, Hopeless 0 0 1  PHQ - 2 Score 0 0 2  Altered sleeping 0 0 0  Tired, decreased energy 0 1 2  Change in appetite 0 0 0  Feeling bad or failure about yourself  0 0 0  Trouble concentrating 0 0 0  Moving slowly or fidgety/restless 0 0 0  Suicidal thoughts 0 0 0  PHQ-9 Score 0 1 4  Difficult doing work/chores Not difficult at all Somewhat difficult Somewhat difficult      08/11/2023    3:10 PM 03/13/2023   10:13 AM 09/10/2022   10:12 AM 06/06/2022    1:58 PM  GAD 7 : Generalized Anxiety Score  Nervous, Anxious, on Edge 0 1 2 1   Control/stop worrying 0 0 2 1  Worry too much - different things 0 0 2 0  Trouble relaxing 0 0 1 0  Restless 0 0 0 0  Easily annoyed or irritable 0 0 1 0  Afraid - awful might happen 0 0 1 0  Total GAD 7 Score 0 1 9 2   Anxiety Difficulty Not difficult at all Not difficult at all Somewhat difficult Not difficult at all      08/11/2023    3:10 PM 03/30/2023     3:09 PM 03/13/2023   10:13 AM  Depression screen PHQ 2/9  Decreased Interest 0 0 1  Down, Depressed, Hopeless 0 0 1  PHQ - 2 Score 0 0 2  Altered sleeping 0 0 0  Tired, decreased energy 0 1 2  Change in appetite 0 0 0  Feeling bad or failure about yourself  0 0 0  Trouble concentrating 0 0 0  Moving slowly or fidgety/restless 0 0 0  Suicidal thoughts 0 0 0  PHQ-9 Score 0 1 4  Difficult doing work/chores Not difficult at all Somewhat difficult Somewhat difficult      08/11/2023    3:10 PM 03/13/2023   10:13 AM 09/10/2022   10:12 AM 06/06/2022    1:58 PM  GAD 7 : Generalized Anxiety Score  Nervous, Anxious, on Edge 0 1 2 1   Control/stop worrying 0 0 2 1  Worry too much - different things 0 0 2 0  Trouble relaxing 0 0 1 0  Restless 0 0 0 0  Easily annoyed or irritable 0 0 1 0  Afraid - awful might happen 0 0 1 0  Total GAD 7 Score 0 1 9 2   Anxiety Difficulty Not difficult at all Not difficult at all Somewhat difficult Not difficult at all   SDOH Screenings   Food Insecurity: No Food Insecurity (03/30/2023)  Housing: Unknown (03/30/2023)  Transportation Needs: No Transportation Needs (03/30/2023)  Utilities: Not At Risk (03/30/2023)  Alcohol Screen: Low Risk  (03/30/2023)  Depression (PHQ2-9): Low Risk  (08/11/2023)  Financial Resource Strain: Low Risk  (03/30/2023)  Physical Activity: Inactive (03/30/2023)  Social Connections: Moderately Integrated (03/30/2023)  Recent Concern: Social Connections - Moderately Isolated (03/12/2023)  Stress: No Stress Concern Present (03/30/2023)  Recent Concern: Stress - Stress Concern Present (03/12/2023)  Tobacco Use: Low Risk  (08/11/2023)  Health Literacy: Adequate Health Literacy (03/30/2023)     Physical Exam Exam conducted with a chaperone present.  Constitutional:      Appearance: Normal appearance.  HENT:     Head: Normocephalic and atraumatic.     Mouth/Throat:     Mouth: Mucous membranes are moist.  Neck:     Thyroid : No thyroid   mass or thyroid  tenderness.  Cardiovascular:     Rate and Rhythm: Normal rate and regular rhythm.  Pulmonary:     Effort: Pulmonary effort is normal.     Breath sounds: Normal breath sounds. No wheezing.  Abdominal:     General: Bowel sounds are normal.     Palpations: Abdomen is soft.     Tenderness:  There is no guarding.  Genitourinary:    Comments: labia majora slightly erythematous without any lesions, white vaginal discharge noted. Musculoskeletal:     Cervical back: Neck supple. No rigidity.     Right lower leg: No edema.     Left lower leg: No edema.  Skin:    General: Skin is warm.  Neurological:     Mental Status: She is alert and oriented to person, place, and time.  Psychiatric:        Mood and Affect: Mood normal.        Behavior: Behavior normal.        No results found for any visits on 08/11/23.  The ASCVD Risk score (Arnett DK, et al., 2019) failed to calculate for the following reasons:   The valid total cholesterol range is 130 to 320 mg/dL    Assessment & Plan:  Type 2 diabetes mellitus with obesity (HCC) Assessment & Plan: Chronic issue.  Check A1c.  Continue Mounjaro  10 mg daily, metformin  500 mg twice a daily, and Jardiance  25 mg daily. Refill sent.   Orders: -     Mounjaro ; Inject 10 mg into the skin once a week.  Dispense: 6 mL; Refill: 3 -     Hemoglobin A1c  Gastroesophageal reflux disease, unspecified whether esophagitis present Assessment & Plan: Chronic issue.  Adequately controlled.  She will continue Protonix  40 mg daily before meals.   Orders: -     Pantoprazole  Sodium; TAKE ONE TABLET BY MOUTH TWICE DAILY BEFORE MEALS  Dispense: 60 tablet; Refill: 3  Leukocytosis, unspecified type Assessment & Plan: Recheck CBC with differential. If worsened from last lab we discussed potential referring patient to heme/onc for further evaluation. If stays stable will continue to f/u on CBC every 6 months.   Orders: -     CBC with  Differential/Platelet  Primary hypertension Assessment & Plan: Chronic issue.  Goal BP <130/80 mmHg. Diastolic blood pressure slightly above goal.  She is only taking Metoprolol  tartrate 25 mg once a day. I counseled on goal BP, patient wanting to see her cardiologist Dr. Meredeth Stallion with whom she has an appointment later this week before she starts Losartan, Amlodipine .   Orders: -     Basic metabolic panel with GFR  Vaginal discharge Assessment & Plan: Wet prep to check for BV, vaginal yeast infection. Management pending results.   Orders: -     WET PREP FOR TRICH, YEAST, CLUE  Lung nodule Assessment & Plan: Reviewed low dose CT lungs from 03/26/23, stable right lung nodule, recommend annual f/u after 03/25/2024 unless symptoms of cough, wheezing, SOB develops.   B12 deficiency Assessment & Plan: Check B 12 level. If low will make recommendations on treatment with B 12 supplements.   Orders: -     Vitamin B12  Anxiety and depression Assessment & Plan: Chronic issue.  Stable on Cymbalta  60 mg daily. Can consider reducing dose to Cymbalta  30 mg in the future.   Orders: -     DULoxetine  HCl; TAKE 2 CAPSULES BY MOUTH EVERY DAY AS DIRECTED  Dispense: 180 capsule; Refill: 1  Hyperlipidemia, unspecified hyperlipidemia type Assessment & Plan: Continue, Crestor  10 mg daily, last LDL within goal    Family history of colon cancer Assessment & Plan: In her sister who died of colorectal cancer when she was 31. Patient has colonoscopy scheduled for July at [facility].    Thrombocytosis Assessment & Plan: Plan per leukocytosis   Other orders -  Empagliflozin ; Take 1 tablet (25 mg total) by mouth daily.  Dispense: 90 tablet; Refill: 1 -     Rosuvastatin  Calcium ; Take 1 tablet (10 mg total) by mouth daily.  Dispense: 90 tablet; Refill: 3    Return in about 3 months (around 11/11/2023) for chronic follow up with Dr. Casimir Cleaver .   Jacklin Mascot, MD

## 2023-08-11 NOTE — Assessment & Plan Note (Signed)
 Chronic issue.  Stable on Cymbalta  60 mg daily. Can consider reducing dose to Cymbalta  30 mg in the future.

## 2023-08-11 NOTE — Assessment & Plan Note (Signed)
 Plan per leukocytosis

## 2023-08-11 NOTE — Assessment & Plan Note (Signed)
>>  ASSESSMENT AND PLAN FOR ANXIETY AND DEPRESSION WRITTEN ON 08/11/2023  4:31 PM BY Jamario Colina, MD  Chronic issue.  Stable on Cymbalta  60 mg daily. Can consider reducing dose to Cymbalta  30 mg in the future.

## 2023-08-11 NOTE — Assessment & Plan Note (Signed)
 In her sister who died of colorectal cancer when she was 33. Patient has colonoscopy scheduled for July at [facility].

## 2023-08-11 NOTE — Assessment & Plan Note (Signed)
 Wet prep to check for BV, vaginal yeast infection. Management pending results.

## 2023-08-11 NOTE — Assessment & Plan Note (Signed)
 Check B 12 level. If low will make recommendations on treatment with B 12 supplements.

## 2023-08-11 NOTE — Assessment & Plan Note (Signed)
 Chronic issue.  Check A1c.  Continue Mounjaro  10 mg daily, metformin  500 mg twice a daily, and Jardiance  25 mg daily. Refill sent.

## 2023-08-11 NOTE — Assessment & Plan Note (Signed)
 Reviewed low dose CT lungs from 03/26/23, stable right lung nodule, recommend annual f/u after 03/25/2024 unless symptoms of cough, wheezing, SOB develops.

## 2023-08-12 DIAGNOSIS — M1712 Unilateral primary osteoarthritis, left knee: Secondary | ICD-10-CM | POA: Diagnosis not present

## 2023-08-12 LAB — BASIC METABOLIC PANEL WITH GFR

## 2023-08-12 LAB — CBC WITH DIFFERENTIAL/PLATELET
Basophils Absolute: 0.1 10*3/uL (ref 0.0–0.2)
Basos: 1 %
EOS (ABSOLUTE): 0.1 10*3/uL (ref 0.0–0.4)
Eos: 1 %
Hematocrit: 34.6 % (ref 34.0–46.6)
Hemoglobin: 10.9 g/dL — ABNORMAL LOW (ref 11.1–15.9)
Immature Grans (Abs): 0 10*3/uL (ref 0.0–0.1)
Immature Granulocytes: 0 %
Lymphocytes Absolute: 2.5 10*3/uL (ref 0.7–3.1)
Lymphs: 26 %
MCH: 26.8 pg (ref 26.6–33.0)
MCHC: 31.5 g/dL (ref 31.5–35.7)
MCV: 85 fL (ref 79–97)
Monocytes Absolute: 0.7 10*3/uL (ref 0.1–0.9)
Monocytes: 7 %
Neutrophils Absolute: 6.1 10*3/uL (ref 1.4–7.0)
Neutrophils: 65 %
Platelets: 513 10*3/uL — ABNORMAL HIGH (ref 150–450)
RBC: 4.06 x10E6/uL (ref 3.77–5.28)
RDW: 14 % (ref 11.7–15.4)
WBC: 9.4 10*3/uL (ref 3.4–10.8)

## 2023-08-12 LAB — HEMOGLOBIN A1C
Est. average glucose Bld gHb Est-mCnc: 103 mg/dL
Hgb A1c MFr Bld: 5.2 % (ref 4.8–5.6)

## 2023-08-12 LAB — VITAMIN B12: Vitamin B-12: 344 pg/mL (ref 232–1245)

## 2023-08-13 ENCOUNTER — Ambulatory Visit: Payer: Self-pay

## 2023-08-13 ENCOUNTER — Encounter: Payer: Self-pay | Admitting: Cardiovascular Disease

## 2023-08-13 ENCOUNTER — Other Ambulatory Visit: Payer: Self-pay

## 2023-08-13 ENCOUNTER — Ambulatory Visit: Admitting: Cardiovascular Disease

## 2023-08-13 VITALS — BP 132/82 | HR 88 | Ht 63.0 in | Wt 192.0 lb

## 2023-08-13 DIAGNOSIS — N898 Other specified noninflammatory disorders of vagina: Secondary | ICD-10-CM

## 2023-08-13 DIAGNOSIS — E1169 Type 2 diabetes mellitus with other specified complication: Secondary | ICD-10-CM

## 2023-08-13 DIAGNOSIS — R7989 Other specified abnormal findings of blood chemistry: Secondary | ICD-10-CM

## 2023-08-13 DIAGNOSIS — R0789 Other chest pain: Secondary | ICD-10-CM

## 2023-08-13 DIAGNOSIS — D75839 Thrombocytosis, unspecified: Secondary | ICD-10-CM

## 2023-08-13 DIAGNOSIS — Z01810 Encounter for preprocedural cardiovascular examination: Secondary | ICD-10-CM

## 2023-08-13 DIAGNOSIS — D649 Anemia, unspecified: Secondary | ICD-10-CM

## 2023-08-13 DIAGNOSIS — I340 Nonrheumatic mitral (valve) insufficiency: Secondary | ICD-10-CM | POA: Diagnosis not present

## 2023-08-13 DIAGNOSIS — I10 Essential (primary) hypertension: Secondary | ICD-10-CM

## 2023-08-13 DIAGNOSIS — D509 Iron deficiency anemia, unspecified: Secondary | ICD-10-CM

## 2023-08-13 LAB — BASIC METABOLIC PANEL WITH GFR
BUN/Creatinine Ratio: 17 (ref 12–28)
BUN: 13 mg/dL (ref 8–27)
CO2: 20 mmol/L (ref 20–29)
Calcium: 9.9 mg/dL (ref 8.7–10.3)
Chloride: 106 mmol/L (ref 96–106)
Creatinine, Ser: 0.75 mg/dL (ref 0.57–1.00)
Glucose: 80 mg/dL (ref 70–99)
Potassium: 4.2 mmol/L (ref 3.5–5.2)
Sodium: 145 mmol/L — ABNORMAL HIGH (ref 134–144)
eGFR: 87 mL/min/{1.73_m2} (ref >59)

## 2023-08-13 LAB — SPECIMEN STATUS REPORT

## 2023-08-13 LAB — WET PREP FOR TRICH, YEAST, CLUE

## 2023-08-13 MED ORDER — FUROSEMIDE 20 MG PO TABS: ORAL_TABLET | ORAL | 2 refills | Status: AC

## 2023-08-13 MED ORDER — LOSARTAN POTASSIUM 25 MG PO TABS
25.0000 mg | ORAL_TABLET | Freq: Every day | ORAL | 2 refills | Status: DC
Start: 2023-08-13 — End: 2023-09-17

## 2023-08-13 NOTE — Progress Notes (Signed)
 Cardiology Office Note   Date:  08/13/2023   ID:  Shelby Estes, DOB [birth year], MRN [id number]  PCP:  Jacklin Mascot, MD  Cardiologist:  Debborah Fairly, MD      History of Present Illness: Shelby Estes is a 68 y.o. female who presents for  Chief Complaint  Patient presents with   Follow-up    3 month follow up    Not been on amlodapine 5 and losartan 100 as BP was low during knee surgery 06/16/23, has swelling of legs.      Past Medical History:  Diagnosis Date   Anxiety    Depression    Diabetes mellitus 2010   Elevated BP    GERD (gastroesophageal reflux disease)    Hypertension    Kidney stone    Dr. Karan Osgood, April 2014   Osteoarthritis of knee 02/22/2016   Renal cyst    Dr. 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 Vona Grumet   Arm Surgery     due to fracture left elbow   CHOLECYSTECTOMY  07/15/2005   Dr Marquita Situ   COLONOSCOPY  03/17/2005   Dr Marquita Situ   COLONOSCOPY WITH PROPOFOL  N/A 06/20/2015   Procedure: COLONOSCOPY WITH PROPOFOL ;  Surgeon: Marshall Skeeter, MD;  Location: [facility] ENDOSCOPY;  Service: Endoscopy;  Laterality: N/A;   REPLACEMENT TOTAL KNEE  04/07/2023   REPLACEMENT TOTAL KNEE  06/16/2023   TUBAL LIGATION  02/21/2001     Current Outpatient Medications  Medication Sig Dispense Refill   furosemide (LASIX) 20 MG tablet Take 1 tab every other day PRN 30 tablet 2   losartan (COZAAR) 25 MG tablet Take 1 tablet (25 mg total) by mouth daily. 30 tablet 2   ADULT ASPIRIN REGIMEN 81 MG EC tablet      Alcohol Swabs (B-D SINGLE USE SWABS REGULAR) PADS Use daily to clean skin prior to testing/injection 100 each 3   Blood Glucose Monitoring Suppl (TRUE METRIX AIR GLUCOSE  METER) w/Device KIT 1 each by Does not apply route daily. 1 kit 0   Cholecalciferol (VITAMIN D ) 2000 UNITS CAPS Take 1 capsule by mouth daily.     colchicine 0.6 MG tablet Take 0.6 mg by mouth 2 (two) times daily.     DULoxetine  (CYMBALTA ) 30 MG capsule TAKE 2 CAPSULES BY MOUTH EVERY DAY AS DIRECTED 180 capsule 1   empagliflozin  (JARDIANCE ) 25 MG TABS tablet Take 1 tablet (25 mg total) by mouth daily. 90 tablet 1   fluticasone  (FLONASE ) 50 MCG/ACT nasal spray Place 2 sprays into both nostrils daily. (Patient taking differently: Place 2 sprays into both nostrils daily. As needed) 16 g 6   glucose blood (GNP TRUE METRIX GLUCOSE STRIPS) test strip Check once daily. 100 each 12   LINZESS 145 MCG CAPS capsule      meloxicam (MOBIC) 15 MG tablet Take 15 mg by mouth daily.      metFORMIN  (GLUCOPHAGE ) 500 MG tablet Take 1 tablet (500 mg total) by mouth in the morning and at bedtime. 180 tablet 3   metoprolol  tartrate (LOPRESSOR ) 25 MG tablet TAKE ONE (1) TABLET BY MOUTH TWO TIMES PER DAY 180 tablet 0   pantoprazole  (PROTONIX ) 40 MG tablet TAKE ONE TABLET BY MOUTH TWICE DAILY  BEFORE MEALS 60 tablet 3   polyethylene glycol powder (GLYCOLAX /MIRALAX ) 17 GM/SCOOP powder Take 17 g by mouth daily as needed for mild constipation. 500 g 0   rosuvastatin  (CRESTOR ) 10 MG tablet Take 1 tablet (10 mg total) by mouth daily. 90 tablet 3   tirzepatide  (MOUNJARO ) 10 MG/0.5ML Pen Inject 10 mg into the skin once a week. 6 mL 3   TRUEplus Lancets 30G MISC TEST BLOOD SUGAR twice DAILY 200 each 3   No current facility-administered medications for this visit.    Allergies:   Bupropion , Clarithromycin, Enalapril , Phentermine , Ceclor [cefaclor], Penicillins, and Sulfa antibiotics    Social History:   reports that she has never smoked. She has never used smokeless tobacco. She reports that she does not drink alcohol and does not use drugs.   Family History:  family history includes Breast cancer in her maternal aunt and  another family member; COPD in her father; Cancer in an other family member; Colon cancer in her cousin and sister; Diabetes in her mother; Hypertension in her mother; Seizures in her maternal grandmother; Stroke in her father; Thyroid  disease in her mother.    ROS:     Review of Systems  Constitutional: Negative.   HENT: Negative.    Eyes: Negative.   Respiratory: Negative.    Gastrointestinal: Negative.   Genitourinary: Negative.   Musculoskeletal: Negative.   Skin: Negative.   Neurological: Negative.   Endo/Heme/Allergies: Negative.   Psychiatric/Behavioral: Negative.    All other systems reviewed and are negative.     All other systems are reviewed and negative.    PHYSICAL EXAM: VS:  BP 132/82   Pulse 88   Ht 5\' 3"  (1.6 m)   Wt 192 lb (87.1 kg)   SpO2 97%   BMI 34.01 kg/m  , BMI Body mass index is 34.01 kg/m. Last weight:  Wt Readings from Last 3 Encounters:  08/13/23 192 lb (87.1 kg)  08/11/23 193 lb (87.5 kg)  06/11/23 189 lb (85.7 kg)     Physical Exam Constitutional:      Appearance: Normal appearance.  Cardiovascular:     Rate and Rhythm: Normal rate and regular rhythm.     Heart sounds: Normal heart sounds.  Pulmonary:     Effort: Pulmonary effort is normal.     Breath sounds: Normal breath sounds.  Musculoskeletal:     Right lower leg: No edema.     Left lower leg: No edema.  Neurological:     Mental Status: She is alert.       EKG:   Recent Labs: 09/10/2022: ALT 8 08/11/2023: BUN 13; Creatinine, Ser 0.75; Hemoglobin 10.9; Platelets 513; Potassium 4.2; Sodium 145    Lipid Panel    Component Value Date/Time   CHOL 107 06/06/2022 1402   TRIG 146 06/06/2022 1402   HDL 46 06/06/2022 1402   CHOLHDL 2.3 06/06/2022 1402   CHOLHDL 3 05/03/2021 1037   VLDL 43.4 (H) 05/03/2021 1037   LDLCALC 36 06/06/2022 1402   LDLDIRECT 50.0 05/03/2021 1037      Other studies Reviewed: Additional studies/ records that were reviewed today include:   Review of the above records demonstrates:       No data to display            ASSESSMENT AND PLAN:    ICD-10-CM   1. Mitral valve insufficiency, unspecified etiology  I34.0 losartan  (COZAAR ) 25 MG tablet    furosemide  (LASIX ) 20 MG tablet    2.  Pre-operative cardiovascular examination  Z01.810 losartan (COZAAR) 25 MG tablet    furosemide (LASIX) 20 MG tablet    3. Chest pain, non-cardiac  R07.89 losartan (COZAAR) 25 MG tablet    furosemide (LASIX) 20 MG tablet    4. Nonrheumatic mitral valve regurgitation  I34.0 losartan (COZAAR) 25 MG tablet    furosemide (LASIX) 20 MG tablet    5. Primary hypertension  I10 losartan (COZAAR) 25 MG tablet    furosemide (LASIX) 20 MG tablet   BP is high as off losartan 100 and amlodapine 5 mg daily. Will gradually start losartan 25. Take lasix 20 PRN leg swelling.       Problem List Items Addressed This Visit       Cardiovascular and Mediastinum   Hypertension (Chronic)   Relevant Medications   losartan (COZAAR) 25 MG tablet   furosemide (LASIX) 20 MG tablet   Other Visit Diagnoses       Mitral valve insufficiency, unspecified etiology    -  Primary   Relevant Medications   losartan (COZAAR) 25 MG tablet   furosemide (LASIX) 20 MG tablet     Pre-operative cardiovascular examination       Relevant Medications   losartan (COZAAR) 25 MG tablet   furosemide (LASIX) 20 MG tablet     Chest pain, non-cardiac       Relevant Medications   losartan (COZAAR) 25 MG tablet   furosemide (LASIX) 20 MG tablet     Nonrheumatic mitral valve regurgitation       Relevant Medications   losartan (COZAAR) 25 MG tablet   furosemide (LASIX) 20 MG tablet          Disposition:   Return in about 4 weeks (around 09/10/2023).    Total time spent: 30 minutes  Signed,  Debborah Fairly, MD  08/13/2023 9:43 AM    [facility]

## 2023-08-13 NOTE — Progress Notes (Addendum)
 1. High serum sodium (Primary) - Basic Metabolic Panel (BMET); Future  2. Thrombocytosis - CBC with Differential/Platelet; Future - Iron, TIBC and Ferritin Panel; Future  3. Low hemoglobin - CBC with Differential/Platelet; Future - Iron, TIBC and Ferritin Panel; Future   Jacklin Mascot, MD     --------------------------------- ADDENDUM:  Talked to the patient on the phon eon 09/03/23 recommended EGD to rule out GI bleed as cause for iron deficiency anemia. She has colonoscopy scheduled through [facility] on 09/16/23. Destiy (CMA) reached out to [facility] who are okay with doing both EGD and colonoscopy and will reach out to patient as well.   Jacklin Mascot, MD

## 2023-08-19 DIAGNOSIS — M25561 Pain in right knee: Secondary | ICD-10-CM | POA: Diagnosis not present

## 2023-08-19 DIAGNOSIS — M109 Gout, unspecified: Secondary | ICD-10-CM | POA: Diagnosis not present

## 2023-08-19 DIAGNOSIS — M1712 Unilateral primary osteoarthritis, left knee: Secondary | ICD-10-CM | POA: Diagnosis not present

## 2023-08-20 DIAGNOSIS — N898 Other specified noninflammatory disorders of vagina: Secondary | ICD-10-CM

## 2023-08-21 ENCOUNTER — Other Ambulatory Visit: Payer: Self-pay

## 2023-08-21 MED ORDER — METFORMIN HCL 500 MG PO TABS: 500.0000 mg | ORAL_TABLET | Freq: Two times a day (BID) | ORAL | 3 refills | Status: DC

## 2023-08-21 NOTE — Telephone Encounter (Signed)
 If we receive fax from [facility] saying they are able to run vaginal swab from 08/11/23 she does not need repeat swab. If they reach out saying they are not able to run swab recommend patient does self swab during her lab appointment which is scheduled for 08/26/23.    FYI: [facility]. Jen[facility] and [facility] are both aware about this as well.   Jacklin Mascot, MD

## 2023-08-21 NOTE — Telephone Encounter (Signed)
 Spoke to pt and informed her that specimen was never received so she will ave to come back and give another sample, pt stated that she will come in next week to reswab herself for testing.

## 2023-08-22 LAB — WET PREP FOR TRICH, YEAST, CLUE

## 2023-08-24 LAB — NUSWAB VAGINITIS (VG)
Candida albicans, NAA: NEGATIVE
Candida glabrata, NAA: NEGATIVE
Trich vag by NAA: NEGATIVE

## 2023-08-24 LAB — SPECIMEN STATUS REPORT

## 2023-08-24 MED ORDER — CLOTRIMAZOLE 2 % VA CREA: 1.0000 | TOPICAL_CREAM | Freq: Every day | VAGINAL | 0 refills | Status: AC

## 2023-08-24 NOTE — Telephone Encounter (Signed)
 Noted

## 2023-08-24 NOTE — Progress Notes (Signed)
 Updated patient about results via phone call. She continues to have white, thick attachment around vulva. Recommend prophylactic treatment with  clotrimazole 2 % vaginal cream; Place 1 Applicatorful vaginally at bedtime for 3 days.  If symptoms does not resolve with above treatment recommend reevaluation in clinic. Patient verbalizes understanding and agrees with above plan.   Jacklin Mascot, MD

## 2023-08-26 ENCOUNTER — Other Ambulatory Visit

## 2023-09-02 ENCOUNTER — Other Ambulatory Visit (INDEPENDENT_AMBULATORY_CARE_PROVIDER_SITE_OTHER)

## 2023-09-02 DIAGNOSIS — R7989 Other specified abnormal findings of blood chemistry: Secondary | ICD-10-CM

## 2023-09-02 DIAGNOSIS — D75839 Thrombocytosis, unspecified: Secondary | ICD-10-CM

## 2023-09-02 DIAGNOSIS — M1712 Unilateral primary osteoarthritis, left knee: Secondary | ICD-10-CM | POA: Diagnosis not present

## 2023-09-02 DIAGNOSIS — D649 Anemia, unspecified: Secondary | ICD-10-CM

## 2023-09-02 LAB — CBC WITH DIFFERENTIAL/PLATELET
Basophils Absolute: 0.1 10*3/uL (ref 0.0–0.1)
Basophils Relative: 0.7 % (ref 0.0–3.0)
Eosinophils Absolute: 0.1 10*3/uL (ref 0.0–0.7)
Eosinophils Relative: 1.7 % (ref 0.0–5.0)
HCT: 33.1 % — ABNORMAL LOW (ref 36.0–46.0)
Hemoglobin: 10.7 g/dL — ABNORMAL LOW (ref 12.0–15.0)
Lymphocytes Relative: 20.7 % (ref 12.0–46.0)
Lymphs Abs: 1.7 10*3/uL (ref 0.7–4.0)
MCHC: 32.4 g/dL (ref 30.0–36.0)
MCV: 80.4 fl (ref 78.0–100.0)
Monocytes Absolute: 0.5 10*3/uL (ref 0.1–1.0)
Monocytes Relative: 5.9 % (ref 3.0–12.0)
Neutro Abs: 5.9 10*3/uL (ref 1.4–7.7)
Neutrophils Relative %: 71 % (ref 43.0–77.0)
Platelets: 321 10*3/uL (ref 150.0–400.0)
RBC: 4.12 Mil/uL (ref 3.87–5.11)
RDW: 15.7 % — ABNORMAL HIGH (ref 11.5–15.5)
WBC: 8.3 10*3/uL (ref 4.0–10.5)

## 2023-09-02 LAB — BASIC METABOLIC PANEL WITH GFR
BUN: 17 mg/dL (ref 6–23)
CO2: 30 meq/L (ref 19–32)
Calcium: 9.6 mg/dL (ref 8.4–10.5)
Chloride: 105 meq/L (ref 96–112)
Creatinine, Ser: 0.88 mg/dL (ref 0.40–1.20)
GFR: 67.97 mL/min (ref >60.00)
Glucose, Bld: 108 mg/dL — ABNORMAL HIGH (ref 70–99)
Potassium: 3.3 meq/L — ABNORMAL LOW (ref 3.5–5.1)
Sodium: 141 meq/L (ref 135–145)

## 2023-09-02 NOTE — Addendum Note (Signed)
 Addended by: Thressa Flora D on: 09/02/2023 08:08 AM   Modules accepted: Orders

## 2023-09-03 ENCOUNTER — Ambulatory Visit: Payer: Self-pay

## 2023-09-03 ENCOUNTER — Other Ambulatory Visit: Payer: Self-pay

## 2023-09-03 DIAGNOSIS — D509 Iron deficiency anemia, unspecified: Secondary | ICD-10-CM

## 2023-09-03 DIAGNOSIS — E876 Hypokalemia: Secondary | ICD-10-CM

## 2023-09-03 DIAGNOSIS — Z96652 Presence of left artificial knee joint: Secondary | ICD-10-CM | POA: Diagnosis not present

## 2023-09-03 LAB — IRON,TIBC AND FERRITIN PANEL
Ferritin: 17 ng/mL (ref 15–150)
Iron Saturation: 8 % — CL (ref 15–55)
Iron: 29 ug/dL (ref 27–139)
Total Iron Binding Capacity: 356 ug/dL (ref 250–450)
UIBC: 327 ug/dL (ref 118–369)

## 2023-09-03 MED ORDER — IRON (FERROUS SULFATE) 325 (65 FE) MG PO TABS: 325.0000 mg | ORAL_TABLET | Freq: Every day | ORAL | 0 refills | Status: DC

## 2023-09-03 MED ORDER — POTASSIUM CHLORIDE ER 10 MEQ PO CPCR
10.0000 meq | ORAL_CAPSULE | Freq: Two times a day (BID) | ORAL | 0 refills | Status: DC
Start: 2023-09-03 — End: 2023-09-03

## 2023-09-03 MED ORDER — POTASSIUM CHLORIDE ER 10 MEQ PO CPCR: 10.0000 meq | ORAL_CAPSULE | Freq: Two times a day (BID) | ORAL | 0 refills | Status: DC

## 2023-09-03 NOTE — Progress Notes (Signed)
 Noted

## 2023-09-03 NOTE — Addendum Note (Signed)
 Addended by: Sahily Biddle on: 09/03/2023 04:15 PM   Modules accepted: Orders

## 2023-09-03 NOTE — Addendum Note (Signed)
 Addended by: Jolaine Fryberger on: 09/03/2023 03:28 PM   Modules accepted: Orders

## 2023-09-03 NOTE — Progress Notes (Signed)
 Please convey the following to the patient:  - Her anemia is slightly worse than a month ago. She also has really low iron. I recommend hematology referral for potential iron infusion and further evaluation. I also recommend referral to gastroenterologist to see if  she needs EGD, colonoscopy to rule out GI bleed contributing to anemia. She has seen [facility] in the past. If she wants to follow up with them she will have to reach out to them to schedule an appointment. If she wants GI referral to [facility] or [facility] I am happy to put a referral. At the meantime I recommend she start Ferrous sulfate, 1 tab twice a day.  This can cause constipation, black colored stool. She should increase daily fiber and water intake to reduce these side effects.Refrain from taking NSAIDs group medication like Ibuprofen, Meloxicam, Naproxen as these can worsen anemia.   Potassium is low, recommend start taking Potassium twice a day. I have sent the prescriptions to her pharmacy. I also recommend repeating serum Potassium, Magnesium in about 1 week to make sure it is normalized.    Thank you,  Jacklin Mascot, MD

## 2023-09-03 NOTE — Telephone Encounter (Signed)
 1. Hypokalemia (Primary) - Magnesium; Future - Potassium; Future - potassium chloride  (MICRO-K ) 10 MEQ CR capsule; Take 1 capsule (10 mEq total) by mouth 2 (two) times daily.  Dispense: 180 capsule; Refill: 0  2. Iron deficiency anemia, unspecified iron deficiency anemia type - Ambulatory referral to Hematology / Oncology - Iron, Ferrous Sulfate, 325 (65 Fe) MG TABS; Take 325 mg by mouth daily.  Dispense: 180 tablet; Refill: 0 - Ambulatory referral to Gastroenterology   --------------------- -----------------  Addendum:  Urgent referral to [facility] made. Please fax the referral to Dr. Louvella Royalty office.   Jacklin Mascot, MD

## 2023-09-03 NOTE — Progress Notes (Signed)
 She will need EGD or upper endoscopy done as well to rule out GI bleed. I am going to order EGD to see if both can be done at the same time if patient is willing. We can fax the order to Aloha Jakes MD's office at [facility] if patient is willing.   Thank you,  Jacklin Mascot, MD

## 2023-09-04 MED ORDER — POTASSIUM CHLORIDE ER 10 MEQ PO CPCR: 10.0000 meq | ORAL_CAPSULE | Freq: Two times a day (BID) | ORAL | 0 refills | Status: DC

## 2023-09-07 DIAGNOSIS — G4733 Obstructive sleep apnea (adult) (pediatric): Secondary | ICD-10-CM | POA: Diagnosis not present

## 2023-09-08 ENCOUNTER — Encounter: Payer: Self-pay | Admitting: Oncology

## 2023-09-08 ENCOUNTER — Inpatient Hospital Stay: Attending: Oncology | Admitting: Oncology

## 2023-09-08 ENCOUNTER — Inpatient Hospital Stay

## 2023-09-08 VITALS — BP 144/86 | HR 79 | Temp 96.6°F | Resp 18 | Wt 186.4 lb

## 2023-09-08 DIAGNOSIS — Z803 Family history of malignant neoplasm of breast: Secondary | ICD-10-CM | POA: Diagnosis not present

## 2023-09-08 DIAGNOSIS — D509 Iron deficiency anemia, unspecified: Secondary | ICD-10-CM | POA: Insufficient documentation

## 2023-09-08 DIAGNOSIS — Z800 Family history of malignant neoplasm of digestive organs: Secondary | ICD-10-CM | POA: Insufficient documentation

## 2023-09-08 DIAGNOSIS — D500 Iron deficiency anemia secondary to blood loss (chronic): Secondary | ICD-10-CM

## 2023-09-08 NOTE — Progress Notes (Signed)
 Hematology/Oncology Consult note [phone] [phone]      Patient Care Team: Bair, Kalpana, MD as PCP - General (Family Medicine) Cleotilde Barrio, MD (Orthopedic Surgery) Vannie Delon LABOR, MD (Internal Medicine) Dessa, Reyes ORN, MD (General Surgery)   REFERRING PROVIDER: Abbey Bruckner, MD  CHIEF COMPLAINTS/REASON FOR VISIT:  Anemia  ASSESSMENT & PLAN:  Iron  deficiency anemia Lab Results  Component Value Date   HGB 10.7 (L) 09/02/2023   TIBC 356 09/02/2023   IRONPCTSAT 8 (LL) 09/02/2023   FERRITIN 17 09/02/2023    I discussed about option of continue oral iron  supplementation and repeat blood work for evaluation of treatment response.  If no significant improvement, then proceed with IV Venofer treatments. Alternative option of proceed with IV Venofer treatments. I discussed about the potential risks including but not limited to allergic reactions/infusion reactions including anaphylactic reactions, diarrhea, phlebitis, high blood pressure, wheezing, SOB, skin rash, weight gain,dark urine, leg swelling, back pain, headache, nausea and fatigue, etc. Patient tolerates oral iron  supplement poorly and desires to achieved higher level of iron  faster for adequate hematopoesis. Plan IV venofer weekly x 3   Etiology of iron  deficient anemia, suspect GI blood loss due to previous NSAID use  Recommend patient to follow-up with gastroenterology  Orders Placed This Encounter  Procedures   CBC with Differential (Cancer Center Only)    Standing Status:   Future    Expected Date:   12/09/2023    Expiration Date:   03/08/2024   Iron  and TIBC    Standing Status:   Future    Expected Date:   12/09/2023    Expiration Date:   03/08/2024   Ferritin    Standing Status:   Future    Expected Date:   12/09/2023    Expiration Date:   03/08/2024   Retic Panel    Standing Status:   Future    Expected Date:   12/09/2023    Expiration Date:   03/08/2024   Follow-up  in 3 months All questions were answered. The patient knows to call the clinic with any problems, questions or concerns.  Zelphia Cap, MD, PhD [facility] Hematology Oncology 09/08/2023     HISTORY OF PRESENTING ILLNESS:  Shelby Estes is a  68 y.o.  female with PMH listed below who was referred to me for anemia Reviewed patient's recent labs that was done.  She was found to have abnormal CBC on 09/02/2023 with hemoglobin of 10.7, MCV 88.4, iron  panel showed iron  saturation of 8, ferritin of 17, TIBC 356. Reviewed patient's previous labs ordered by primary care physician's office, anemia is new onset, duration is since May 2025. Patient reports that she had bilateral knee surgery.she has taken meloxicam for a period of time.  Currently off any NSAIDs.   She denies recent chest pain on exertion, shortness of breath on minimal exertion, pre-syncopal episodes, or palpitations.  Sometimes she feels fatigued.  She feels quite well after mobility improves after knee replacement. She had not noticed any recent bleeding such as epistaxis, hematuria or hematochezia.  She denies over the counter NSAID ingestion. She is not on antiplatelets agents. Her last colonoscopy was with [facility] in 2022. Patient's primary care provider has prescribed oral iron  supplementation which she has not started.  She has concerned about potential side effects as she has constipation at baseline.  08/11/2023, B12 level 344.  She takes vitamin B12 supplementation.  MEDICAL HISTORY:  Past Medical History:  Diagnosis Date  Anxiety    Depression    Diabetes mellitus 2010   Elevated BP    GERD (gastroesophageal reflux disease)    Hypertension    Kidney stone    Dr. Jodi, April 2014   Osteoarthritis of knee 02/22/2016   Renal cyst    Dr. 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 Shelda Pinal   Arm Surgery     due to fracture left elbow   CHOLECYSTECTOMY  07/15/2005   Dr Dessa   COLONOSCOPY  03/17/2005   Dr Dessa   COLONOSCOPY WITH PROPOFOL  N/A 06/20/2015   Procedure: COLONOSCOPY WITH PROPOFOL ;  Surgeon: Reyes LELON Dessa, MD;  Location: [facility] ENDOSCOPY;  Service: Endoscopy;  Laterality: N/A;   REPLACEMENT TOTAL KNEE  04/07/2023   REPLACEMENT TOTAL KNEE  06/16/2023   TUBAL LIGATION  02/21/2001    SOCIAL HISTORY: Social History   Socioeconomic History   Marital status: Married    Spouse name: Not on file   Number of children: 0   Years of education: Not on file   Highest education level: Associate degree: academic program  Occupational History   Occupation: Producer, television/film/video: OTHER   Occupation: retired  Tobacco Use   Smoking status: Never   Smokeless tobacco: Never  Vaping Use   Vaping status: Never Used  Substance and Sexual Activity   Alcohol use: No   Drug use: No   Sexual activity: Yes    Birth control/protection: Surgical  Other Topics Concern   Not on file  Social History Narrative   Lives with husband, dog. Work - Mudlogger. Step children. Hobbies - Quilting, gardening      Regular Exercise -  NO   Daily Caffeine Use:  1-2 cups coffee, 1-2 soda/tea               Social Drivers of Health   Financial Resource Strain: Low Risk  (09/08/2023)   Overall Financial Resource Strain (CARDIA)    Difficulty of Paying Living Expenses: Not very hard  Food Insecurity: No Food Insecurity (09/08/2023)   Hunger Vital Sign    Worried About Running Out of Food in the Last Year: Never true    Ran Out of Food in the Last Year: Never true  Transportation Needs: No Transportation Needs (09/08/2023)   PRAPARE -  Administrator, Civil Service (Medical): No    Lack of Transportation (Non-Medical): No  Physical Activity: Inactive (03/30/2023)   Exercise Vital Sign    Days of Exercise per Week: 0 days    Minutes of Exercise per Session: 0 min  Stress: No Stress Concern Present (09/08/2023)   [employer] of Occupational Health - Occupational Stress Questionnaire    Feeling of Stress: Only a little  Social Connections: Moderately Integrated (03/30/2023)   Social Connection and Isolation Panel    Frequency of Communication with Friends and Family: More than three times a week    Frequency of Social Gatherings with Friends and Family: Once a week    Attends Religious Services: More than 4 times per year    Active Member of [employer] or Organizations: No    Attends Banker Meetings: Never  Marital Status: Married  Recent Concern: Social Connections - Moderately Isolated (03/12/2023)   Social Connection and Isolation Panel    Frequency of Communication with Friends and Family: Once a week    Frequency of Social Gatherings with Friends and Family: Once a week    Attends Religious Services: More than 4 times per year    Active Member of [employer] or Organizations: No    Attends Engineer, structural: Not on file    Marital Status: Married  Catering manager Violence: Not At Risk (09/08/2023)   Humiliation, Afraid, Rape, and Kick questionnaire    Fear of Current or Ex-Partner: No    Emotionally Abused: No    Physically Abused: No    Sexually Abused: No    FAMILY HISTORY: Family History  Problem Relation Age of Onset   Diabetes Mother        borderline   Hypertension Mother    Thyroid  disease Mother    COPD Father    Stroke Father    Colon cancer Sister        88   Breast cancer Maternal Aunt    Seizures Maternal Grandmother    Cancer Cousin    Colon cancer Cousin        40's   Breast cancer Other    Cancer Other        breast    ALLERGIES:  is allergic to  bupropion , clarithromycin, enalapril , phentermine , ceclor [cefaclor], penicillins, and sulfa antibiotics.  MEDICATIONS:  Current Outpatient Medications  Medication Sig Dispense Refill   ADULT ASPIRIN REGIMEN 81 MG EC tablet      Alcohol Swabs (B-D SINGLE USE SWABS REGULAR) PADS Use daily to clean skin prior to testing/injection 100 each 3   Blood Glucose Monitoring Suppl (TRUE METRIX AIR GLUCOSE METER) w/Device KIT 1 each by Does not apply route daily. 1 kit 0   Cholecalciferol (VITAMIN D ) 2000 UNITS CAPS Take 1 capsule by mouth daily.     colchicine 0.6 MG tablet Take 0.6 mg by mouth 2 (two) times daily.     DULoxetine  (CYMBALTA ) 30 MG capsule TAKE 2 CAPSULES BY MOUTH EVERY DAY AS DIRECTED 180 capsule 1   empagliflozin  (JARDIANCE ) 25 MG TABS tablet Take 1 tablet (25 mg total) by mouth daily. 90 tablet 1   fluticasone  (FLONASE ) 50 MCG/ACT nasal spray Place 2 sprays into both nostrils daily. (Patient taking differently: Place 2 sprays into both nostrils daily. As needed) 16 g 6   furosemide  (LASIX ) 20 MG tablet Take 1 tab every other day PRN 30 tablet 2   glucose blood (GNP TRUE METRIX GLUCOSE STRIPS) test strip Check once daily. 100 each 12   losartan  (COZAAR ) 25 MG tablet Take 1 tablet (25 mg total) by mouth daily. 30 tablet 2   meloxicam (MOBIC) 15 MG tablet Take 15 mg by mouth daily.      metFORMIN  (GLUCOPHAGE ) 500 MG tablet Take 1 tablet (500 mg total) by mouth in the morning and at bedtime. 180 tablet 3   metoprolol  tartrate (LOPRESSOR ) 25 MG tablet TAKE ONE (1) TABLET BY MOUTH TWO TIMES PER DAY 180 tablet 0   pantoprazole  (PROTONIX ) 40 MG tablet TAKE ONE TABLET BY MOUTH TWICE DAILY BEFORE MEALS 60 tablet 3   polyethylene glycol powder (GLYCOLAX /MIRALAX ) 17 GM/SCOOP powder Take 17 g by mouth daily as needed for mild constipation. 500 g 0   potassium chloride  (MICRO-K ) 10 MEQ CR capsule Take 1 capsule (10 mEq total) by mouth 2 (two)  times daily. 180 capsule 0   rosuvastatin  (CRESTOR ) 10 MG  tablet Take 1 tablet (10 mg total) by mouth daily. 90 tablet 3   tirzepatide  (MOUNJARO ) 10 MG/0.5ML Pen Inject 10 mg into the skin once a week. 6 mL 3   TRUEplus Lancets 30G MISC TEST BLOOD SUGAR twice DAILY 200 each 3   No current facility-administered medications for this visit.    Review of Systems  Constitutional:  Positive for fatigue. Negative for appetite change, chills and fever.  HENT:   Negative for hearing loss and voice change.   Eyes:  Negative for eye problems.  Respiratory:  Negative for chest tightness and cough.   Cardiovascular:  Negative for chest pain.  Gastrointestinal:  Negative for abdominal distention, abdominal pain and blood in stool.  Endocrine: Negative for hot flashes.  Genitourinary:  Negative for difficulty urinating and frequency.   Musculoskeletal:  Negative for arthralgias.       Bilateral knee replacement  Skin:  Negative for itching and rash.  Neurological:  Negative for extremity weakness.  Hematological:  Negative for adenopathy.  Psychiatric/Behavioral:  Negative for confusion.     PHYSICAL EXAMINATION: Vitals:   09/08/23 0942  BP: (!) 144/86  Pulse: 79  Resp: 18  Temp: (!) 96.6 F (35.9 C)  SpO2: 100%   Filed Weights   09/08/23 0942  Weight: 186 lb 6.4 oz (84.6 kg)    Physical Exam Constitutional:      General: She is not in acute distress. HENT:     Head: Normocephalic and atraumatic.   Eyes:     General: No scleral icterus.   Cardiovascular:     Rate and Rhythm: Normal rate and regular rhythm.     Heart sounds: Normal heart sounds.  Pulmonary:     Effort: Pulmonary effort is normal. No respiratory distress.     Breath sounds: No wheezing.  Abdominal:     General: Bowel sounds are normal. There is no distension.     Palpations: Abdomen is soft.   Musculoskeletal:        General: No deformity. Normal range of motion.     Cervical back: Normal range of motion and neck supple.   Skin:    General: Skin is warm and  dry.     Findings: No erythema or rash.   Neurological:     Mental Status: She is alert and oriented to person, place, and time. Mental status is at baseline.     Cranial Nerves: No cranial nerve deficit.     Coordination: Coordination normal.   Psychiatric:        Mood and Affect: Mood normal.      LABORATORY DATA:  I have reviewed the data as listed    Latest Ref Rng & Units 09/02/2023    8:09 AM 08/11/2023    4:17 PM 03/13/2023   10:08 AM  CBC  WBC 4.0 - 10.5 K/uL 8.3  9.4  13.0   Hemoglobin 12.0 - 15.0 g/dL 89.2  89.0  87.6   Hematocrit 36.0 - 46.0 % 33.1  34.6  38.1   Platelets 150.0 - 400.0 K/uL 321.0  513  445.0       Latest Ref Rng & Units 09/02/2023    8:09 AM 08/11/2023    4:35 PM 08/11/2023    4:17 PM  CMP  Glucose 70 - 99 mg/dL 891  80  CANCELED   BUN 6 - 23 mg/dL 17  13  CANCELED  Creatinine 0.40 - 1.20 mg/dL 9.11  9.24  CANCELED   Sodium 135 - 145 mEq/L 141  145  CANCELED   Potassium 3.5 - 5.1 mEq/L 3.3  4.2  CANCELED   Chloride 96 - 112 mEq/L 105  106  CANCELED   CO2 19 - 32 mEq/L 30  20  CANCELED   Calcium  8.4 - 10.5 mg/dL 9.6  9.9  CANCELED    Lab Results  Component Value Date   IRON  29 09/02/2023   TIBC 356 09/02/2023   IRONPCTSAT 8 (LL) 09/02/2023   FERRITIN 17 09/02/2023     RADIOGRAPHIC STUDIES: I have personally reviewed the radiological images as listed and agreed with the findings in the report. No results found.

## 2023-09-08 NOTE — Assessment & Plan Note (Addendum)
 Lab Results  Component Value Date   HGB 10.7 (L) 09/02/2023   TIBC 356 09/02/2023   IRONPCTSAT 8 (LL) 09/02/2023   FERRITIN 17 09/02/2023    I discussed about option of continue oral iron  supplementation and repeat blood work for evaluation of treatment response.  If no significant improvement, then proceed with IV Venofer treatments. Alternative option of proceed with IV Venofer treatments. I discussed about the potential risks including but not limited to allergic reactions/infusion reactions including anaphylactic reactions, diarrhea, phlebitis, high blood pressure, wheezing, SOB, skin rash, weight gain,dark urine, leg swelling, back pain, headache, nausea and fatigue, etc. Patient tolerates oral iron  supplement poorly and desires to achieved higher level of iron  faster for adequate hematopoesis. Plan IV venofer weekly x 3   Etiology of iron  deficient anemia, suspect GI blood loss due to previous NSAID use  Recommend patient to follow-up with gastroenterology

## 2023-09-09 ENCOUNTER — Inpatient Hospital Stay

## 2023-09-09 ENCOUNTER — Telehealth: Payer: Self-pay

## 2023-09-09 VITALS — BP 127/76 | HR 83 | Temp 96.9°F | Resp 18

## 2023-09-09 DIAGNOSIS — Z803 Family history of malignant neoplasm of breast: Secondary | ICD-10-CM | POA: Diagnosis not present

## 2023-09-09 DIAGNOSIS — D509 Iron deficiency anemia, unspecified: Secondary | ICD-10-CM

## 2023-09-09 DIAGNOSIS — Z800 Family history of malignant neoplasm of digestive organs: Secondary | ICD-10-CM | POA: Diagnosis not present

## 2023-09-09 MED ORDER — IRON SUCROSE 20 MG/ML IV SOLN
200.0000 mg | Freq: Once | INTRAVENOUS | Status: AC
  Administered 2023-09-09: 200 mg via INTRAVENOUS
  Filled 2023-09-09: qty 10

## 2023-09-09 NOTE — Telephone Encounter (Signed)
 Copied from CRM [id number]. Topic: Clinical - Medical Advice >> Sep 09, 2023 10:47 AM Chiquita SQUIBB wrote: Reason for CRM: Patient is calling to see if she should still get the blood work tomorrow 06/26, patient had an iron  infusion today. Please call the patient back.

## 2023-09-09 NOTE — Telephone Encounter (Signed)
 She will still need repeat Potassium and check Magnesium level. These are not affected by anemia or iron  infusion.   Thank you,  Luke Shade, MD

## 2023-09-10 ENCOUNTER — Other Ambulatory Visit

## 2023-09-14 ENCOUNTER — Encounter: Payer: Self-pay | Admitting: Cardiovascular Disease

## 2023-09-14 ENCOUNTER — Ambulatory Visit: Admitting: Cardiovascular Disease

## 2023-09-14 VITALS — BP 122/78 | HR 74 | Ht 63.0 in | Wt 187.6 lb

## 2023-09-14 DIAGNOSIS — I10 Essential (primary) hypertension: Secondary | ICD-10-CM | POA: Diagnosis not present

## 2023-09-14 DIAGNOSIS — R002 Palpitations: Secondary | ICD-10-CM | POA: Diagnosis not present

## 2023-09-14 DIAGNOSIS — E785 Hyperlipidemia, unspecified: Secondary | ICD-10-CM | POA: Diagnosis not present

## 2023-09-14 DIAGNOSIS — E876 Hypokalemia: Secondary | ICD-10-CM | POA: Diagnosis not present

## 2023-09-14 NOTE — Progress Notes (Signed)
 Cardiology Office Note   Date:  09/14/2023   ID:  Shelby Estes 05/29/1955, MRN [id number]  PCP:  Abbey Bruckner, MD  Cardiologist:  Denyse Bathe, MD      History of Present Illness: Shelby Estes is a 68 y.o. female who presents for  Chief Complaint  Patient presents with   Follow-up    1 month follow up    Feeling better.      Past Medical History:  Diagnosis Date   Anxiety    Depression    Diabetes mellitus 2010   Elevated BP    GERD (gastroesophageal reflux disease)    Hypertension    Kidney stone    Dr. Jodi, April 2014   Osteoarthritis of knee 02/22/2016   Renal cyst    Dr. 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 Shelda Pinal   Arm Surgery     due to fracture left elbow   CHOLECYSTECTOMY  07/15/2005   Dr Dessa   COLONOSCOPY  03/17/2005   Dr Dessa   COLONOSCOPY WITH PROPOFOL  N/A 06/20/2015   Procedure: COLONOSCOPY WITH PROPOFOL ;  Surgeon: Reyes LELON Dessa, MD;  Location: [facility] ENDOSCOPY;  Service: Endoscopy;  Laterality: N/A;   REPLACEMENT TOTAL KNEE  04/07/2023   REPLACEMENT TOTAL KNEE  06/16/2023   TUBAL LIGATION  02/21/2001     Current Outpatient Medications  Medication Sig Dispense Refill   ADULT ASPIRIN REGIMEN 81 MG EC tablet      Alcohol Swabs (B-D SINGLE USE SWABS REGULAR) PADS Use daily to clean skin prior to testing/injection 100 each 3   Blood Glucose Monitoring Suppl (TRUE METRIX AIR GLUCOSE METER) w/Device KIT 1 each by Does not apply route daily. 1 kit 0   Cholecalciferol (VITAMIN D ) 2000 UNITS CAPS Take 1 capsule by mouth daily.     DULoxetine  (CYMBALTA ) 30 MG capsule TAKE 2 CAPSULES BY MOUTH EVERY DAY AS DIRECTED 180 capsule 1   empagliflozin   (JARDIANCE ) 25 MG TABS tablet Take 1 tablet (25 mg total) by mouth daily. 90 tablet 1   fluticasone  (FLONASE ) 50 MCG/ACT nasal spray Place 2 sprays into both nostrils daily. (Patient taking differently: Place 2 sprays into both nostrils as needed.) 16 g 6   furosemide  (LASIX ) 20 MG tablet Take 1 tab every other day PRN (Patient taking differently: as needed. Take 1 tab every other day PRN) 30 tablet 2   glucose blood (GNP TRUE METRIX GLUCOSE STRIPS) test strip Check once daily. 100 each 12   losartan  (COZAAR ) 25 MG tablet Take 1 tablet (25 mg total) by mouth daily. 30 tablet 2   metFORMIN  (GLUCOPHAGE ) 500 MG tablet Take 1 tablet (500 mg total) by mouth in the morning and at bedtime. 180 tablet 3   metoprolol  tartrate (LOPRESSOR ) 25 MG tablet TAKE ONE (1) TABLET BY MOUTH TWO TIMES PER DAY (Patient taking differently: 2 (two) times daily.) 180 tablet 0   pantoprazole  (PROTONIX ) 40 MG tablet TAKE ONE TABLET BY MOUTH TWICE DAILY BEFORE MEALS 60 tablet 3   polyethylene glycol powder (GLYCOLAX /MIRALAX ) 17 GM/SCOOP powder Take 17 g by mouth daily as needed for mild constipation. 500 g 0   potassium chloride  (MICRO-K ) 10 MEQ CR capsule Take 1 capsule (10 mEq total)  by mouth 2 (two) times daily. 180 capsule 0   rosuvastatin  (CRESTOR ) 10 MG tablet Take 1 tablet (10 mg total) by mouth daily. 90 tablet 3   tirzepatide  (MOUNJARO ) 10 MG/0.5ML Pen Inject 10 mg into the skin once a week. 6 mL 3   TRUEplus Lancets 30G MISC TEST BLOOD SUGAR twice DAILY 200 each 3   meloxicam (MOBIC) 15 MG tablet Take 15 mg by mouth daily.      No current facility-administered medications for this visit.    Allergies:   Bupropion , Clarithromycin, Enalapril , Phentermine , Ceclor [cefaclor], Penicillins, and Sulfa antibiotics    Social History:   reports that she has never smoked. She has never used smokeless tobacco. She reports that she does not drink alcohol and does not use drugs.   Family History:  family history includes  Breast cancer in her maternal aunt and another family member; COPD in her father; Cancer in her cousin and another family member; Colon cancer in her cousin and sister; Diabetes in her mother; Hypertension in her mother; Seizures in her maternal grandmother; Stroke in her father; Thyroid  disease in her mother.    ROS:     Review of Systems  Constitutional: Negative.   HENT: Negative.    Eyes: Negative.   Respiratory: Negative.    Gastrointestinal: Negative.   Genitourinary: Negative.   Musculoskeletal: Negative.   Skin: Negative.   Neurological: Negative.   Endo/Heme/Allergies: Negative.   Psychiatric/Behavioral: Negative.    All other systems reviewed and are negative.     All other systems are reviewed and negative.    PHYSICAL EXAM: VS:  BP 122/78   Pulse 74   Ht 5' 3 (1.6 m)   Wt 187 lb 9.6 oz (85.1 kg)   SpO2 96%   BMI 33.23 kg/m  , BMI Body mass index is 33.23 kg/m. Last weight:  Wt Readings from Last 3 Encounters:  09/14/23 187 lb 9.6 oz (85.1 kg)  09/08/23 186 lb 6.4 oz (84.6 kg)  08/13/23 192 lb (87.1 kg)     Physical Exam Constitutional:      Appearance: Normal appearance.   Cardiovascular:     Rate and Rhythm: Normal rate and regular rhythm.     Heart sounds: Normal heart sounds.  Pulmonary:     Effort: Pulmonary effort is normal.     Breath sounds: Normal breath sounds.   Musculoskeletal:     Right lower leg: No edema.     Left lower leg: No edema.   Neurological:     Mental Status: She is alert.       EKG:   Recent Labs: 09/02/2023: BUN 17; Creatinine, Ser 0.88; Hemoglobin 10.7; Platelets 321.0; Potassium 3.3; Sodium 141    Lipid Panel    Component Value Date/Time   CHOL 107 06/06/2022 1402   TRIG 146 06/06/2022 1402   HDL 46 06/06/2022 1402   CHOLHDL 2.3 06/06/2022 1402   CHOLHDL 3 05/03/2021 1037   VLDL 43.4 (H) 05/03/2021 1037   LDLCALC 36 06/06/2022 1402   LDLDIRECT 50.0 05/03/2021 1037      Other studies  Reviewed: Additional studies/ records that were reviewed today include:  Review of the above records demonstrates:       No data to display            ASSESSMENT AND PLAN:    ICD-10-CM   1. Hypokalemia  E87.6    K was 3.3, on lasix  PRN. Will have rechecked as on 20 meq  daily.    2. Primary hypertension  I10    controlled    3. Palpitations  R00.2    no furthe episodes on metoprolol  25 bid    4. Hyperlipidemia, unspecified hyperlipidemia type  E78.5        Problem List Items Addressed This Visit       Cardiovascular and Mediastinum   Hypertension (Chronic)     Other   Palpitations   Hyperlipidemia   Other Visit Diagnoses       Hypokalemia    -  Primary   K was 3.3, on lasix  PRN. Will have rechecked as on 20 meq daily.          Disposition:   Return in about 3 months (around 12/15/2023).    Total time spent: 30 minutes  Signed,  Denyse Bathe, MD  09/14/2023 9:59 AM    [facility]

## 2023-09-15 DIAGNOSIS — E119 Type 2 diabetes mellitus without complications: Secondary | ICD-10-CM | POA: Diagnosis not present

## 2023-09-16 ENCOUNTER — Inpatient Hospital Stay: Attending: Oncology

## 2023-09-16 VITALS — BP 129/82 | HR 77 | Temp 97.1°F | Resp 18

## 2023-09-16 DIAGNOSIS — D509 Iron deficiency anemia, unspecified: Secondary | ICD-10-CM | POA: Diagnosis not present

## 2023-09-16 MED ORDER — IRON SUCROSE 20 MG/ML IV SOLN
200.0000 mg | Freq: Once | INTRAVENOUS | Status: AC
  Administered 2023-09-16: 200 mg via INTRAVENOUS
  Filled 2023-09-16: qty 10

## 2023-09-17 ENCOUNTER — Other Ambulatory Visit: Payer: Self-pay

## 2023-09-17 ENCOUNTER — Telehealth: Payer: Self-pay

## 2023-09-17 ENCOUNTER — Other Ambulatory Visit: Payer: Self-pay | Admitting: Cardiovascular Disease

## 2023-09-17 DIAGNOSIS — Z90710 Acquired absence of both cervix and uterus: Secondary | ICD-10-CM | POA: Diagnosis not present

## 2023-09-17 DIAGNOSIS — Z7982 Long term (current) use of aspirin: Secondary | ICD-10-CM | POA: Diagnosis not present

## 2023-09-17 DIAGNOSIS — R0789 Other chest pain: Secondary | ICD-10-CM

## 2023-09-17 DIAGNOSIS — K5730 Diverticulosis of large intestine without perforation or abscess without bleeding: Secondary | ICD-10-CM | POA: Diagnosis not present

## 2023-09-17 DIAGNOSIS — Z7984 Long term (current) use of oral hypoglycemic drugs: Secondary | ICD-10-CM | POA: Diagnosis not present

## 2023-09-17 DIAGNOSIS — Z800 Family history of malignant neoplasm of digestive organs: Secondary | ICD-10-CM | POA: Diagnosis not present

## 2023-09-17 DIAGNOSIS — I10 Essential (primary) hypertension: Secondary | ICD-10-CM

## 2023-09-17 DIAGNOSIS — E119 Type 2 diabetes mellitus without complications: Secondary | ICD-10-CM | POA: Diagnosis not present

## 2023-09-17 DIAGNOSIS — Z90721 Acquired absence of ovaries, unilateral: Secondary | ICD-10-CM | POA: Diagnosis not present

## 2023-09-17 DIAGNOSIS — Z96653 Presence of artificial knee joint, bilateral: Secondary | ICD-10-CM | POA: Diagnosis not present

## 2023-09-17 DIAGNOSIS — Z860101 Personal history of adenomatous and serrated colon polyps: Secondary | ICD-10-CM | POA: Diagnosis not present

## 2023-09-17 DIAGNOSIS — I340 Nonrheumatic mitral (valve) insufficiency: Secondary | ICD-10-CM

## 2023-09-17 DIAGNOSIS — Z7985 Long-term (current) use of injectable non-insulin antidiabetic drugs: Secondary | ICD-10-CM | POA: Diagnosis not present

## 2023-09-17 DIAGNOSIS — Z1211 Encounter for screening for malignant neoplasm of colon: Secondary | ICD-10-CM | POA: Diagnosis not present

## 2023-09-17 DIAGNOSIS — Z880 Allergy status to penicillin: Secondary | ICD-10-CM | POA: Diagnosis not present

## 2023-09-17 DIAGNOSIS — Z882 Allergy status to sulfonamides status: Secondary | ICD-10-CM | POA: Diagnosis not present

## 2023-09-17 DIAGNOSIS — K644 Residual hemorrhoidal skin tags: Secondary | ICD-10-CM | POA: Diagnosis not present

## 2023-09-17 DIAGNOSIS — Z01810 Encounter for preprocedural cardiovascular examination: Secondary | ICD-10-CM

## 2023-09-17 NOTE — Telephone Encounter (Signed)
 Patient left VM saying her 25mg  of losartan  was sent to [facility] but they cannot get it to her in time. Prescription was cancelled. She wants it sent to [facility] in [place].

## 2023-09-19 ENCOUNTER — Other Ambulatory Visit: Payer: Self-pay | Admitting: Cardiovascular Disease

## 2023-09-21 MED ORDER — LOSARTAN POTASSIUM 25 MG PO TABS
25.0000 mg | ORAL_TABLET | Freq: Every day | ORAL | 2 refills | Status: DC
Start: 2023-09-21 — End: 2023-11-23

## 2023-09-22 ENCOUNTER — Other Ambulatory Visit

## 2023-09-22 ENCOUNTER — Ambulatory Visit: Payer: Self-pay

## 2023-09-22 DIAGNOSIS — E876 Hypokalemia: Secondary | ICD-10-CM

## 2023-09-22 LAB — POTASSIUM: Potassium: 3.9 meq/L (ref 3.5–5.1)

## 2023-09-22 LAB — MAGNESIUM: Magnesium: 1.8 mg/dL (ref 1.5–2.5)

## 2023-09-23 ENCOUNTER — Inpatient Hospital Stay

## 2023-09-23 VITALS — BP 129/74 | HR 77 | Temp 96.3°F | Resp 18

## 2023-09-23 DIAGNOSIS — D509 Iron deficiency anemia, unspecified: Secondary | ICD-10-CM | POA: Diagnosis not present

## 2023-09-23 MED ORDER — IRON SUCROSE 20 MG/ML IV SOLN
200.0000 mg | Freq: Once | INTRAVENOUS | Status: AC
  Administered 2023-09-23: 200 mg via INTRAVENOUS

## 2023-09-23 MED ORDER — SODIUM CHLORIDE 0.9 % IV SOLN
INTRAVENOUS | Status: DC
  Filled 2023-09-23: qty 250

## 2023-09-23 NOTE — Patient Instructions (Signed)

## 2023-10-16 DIAGNOSIS — E119 Type 2 diabetes mellitus without complications: Secondary | ICD-10-CM | POA: Diagnosis not present

## 2023-10-23 IMAGING — CR DG RIBS 2V*R*
1 series · 2 of 2 positions shown · non-contrast
Comparison: May 02, 2020

CLINICAL DATA: Worsening right rib pain.

EXAM:
RIGHT RIBS - 2 VIEW

[Series 1: dg ribs unilateral right · 0.14mm/px · 2 of 2 slices shown]
[im 1/2]
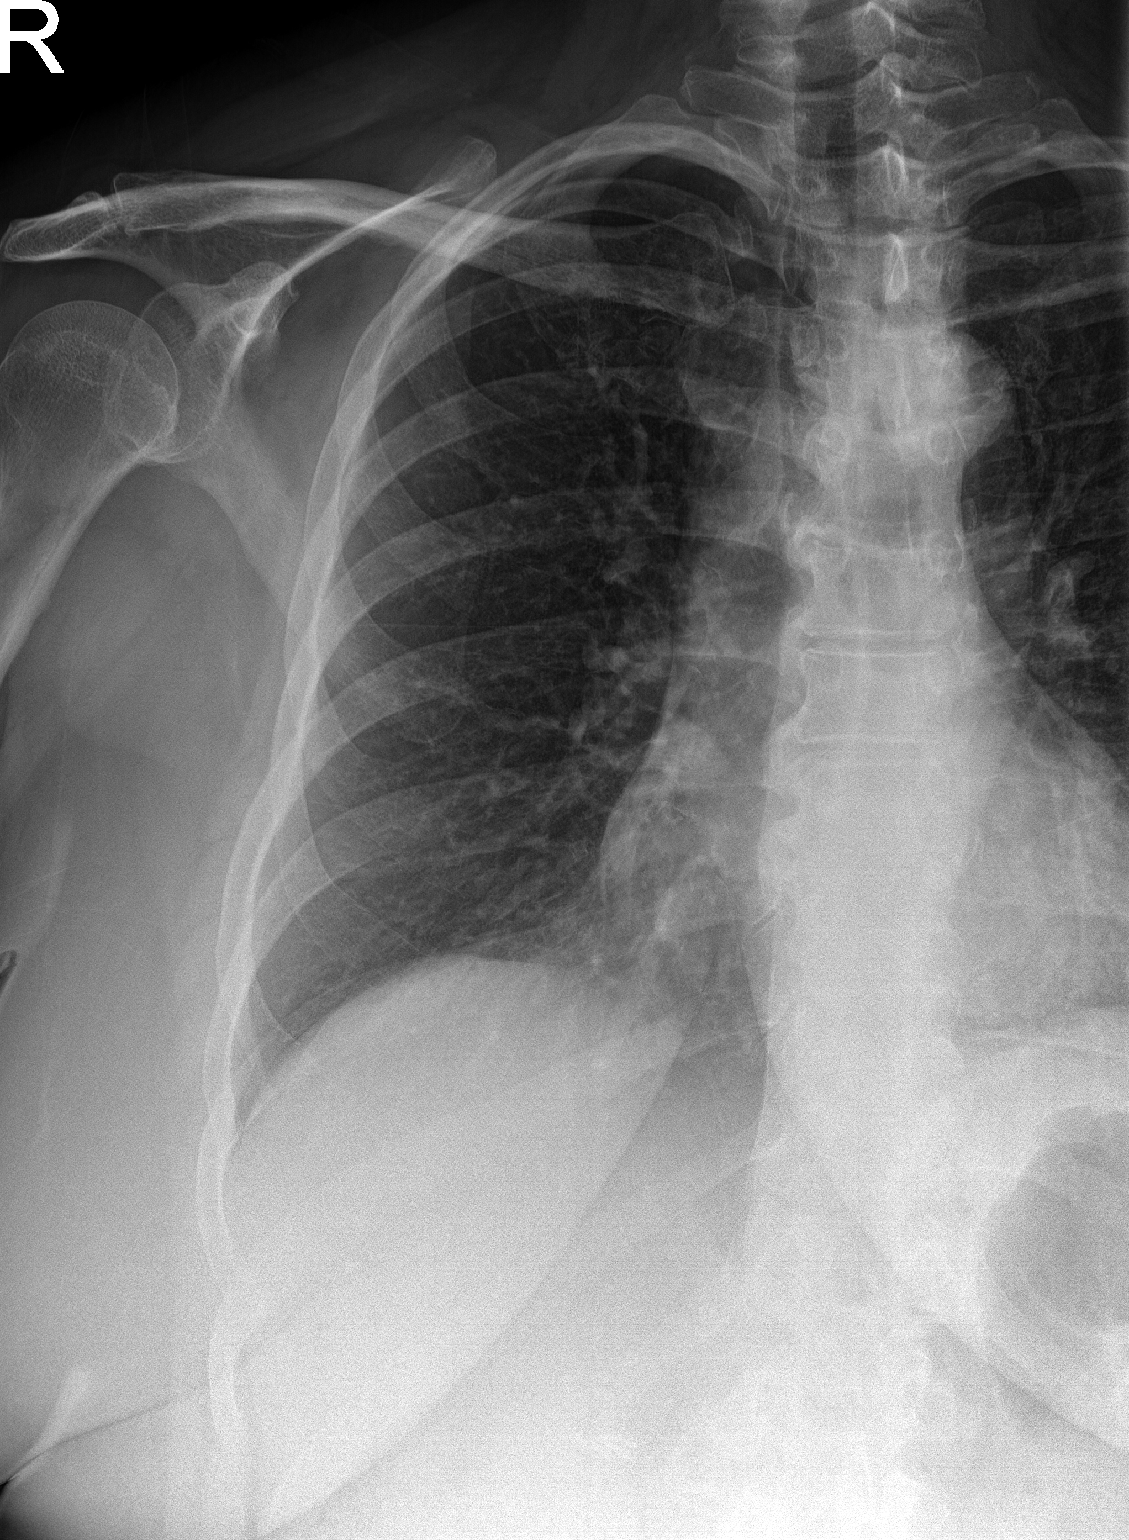
[im 2/2]
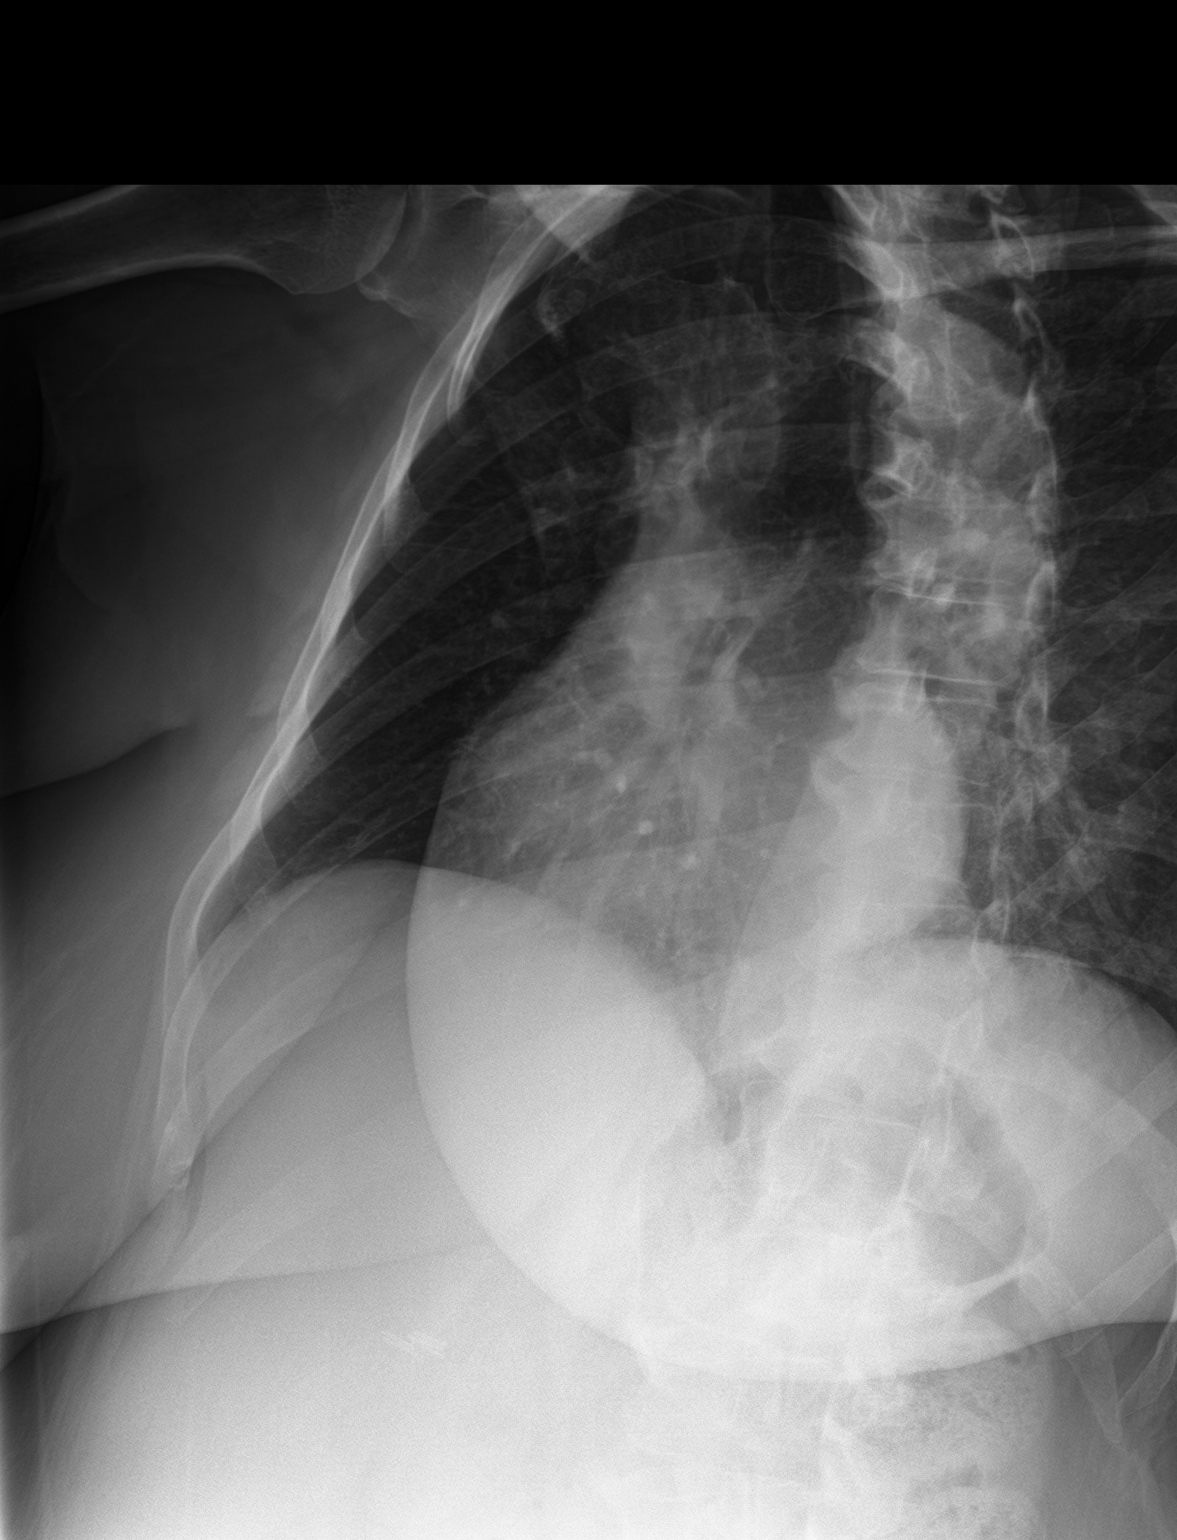

[2 of 2 positions shown; findings below may reference images not displayed]

FINDINGS: No fracture or other bone lesions are seen involving the ribs.
IMPRESSION: Negative.

## 2023-11-04 DIAGNOSIS — D3132 Benign neoplasm of left choroid: Secondary | ICD-10-CM | POA: Diagnosis not present

## 2023-11-04 DIAGNOSIS — H2513 Age-related nuclear cataract, bilateral: Secondary | ICD-10-CM | POA: Diagnosis not present

## 2023-11-04 DIAGNOSIS — H02831 Dermatochalasis of right upper eyelid: Secondary | ICD-10-CM | POA: Diagnosis not present

## 2023-11-04 DIAGNOSIS — E119 Type 2 diabetes mellitus without complications: Secondary | ICD-10-CM | POA: Diagnosis not present

## 2023-11-04 LAB — HM DIABETES EYE EXAM

## 2023-11-12 ENCOUNTER — Ambulatory Visit

## 2023-11-16 DIAGNOSIS — E119 Type 2 diabetes mellitus without complications: Secondary | ICD-10-CM | POA: Diagnosis not present

## 2023-11-21 ENCOUNTER — Other Ambulatory Visit: Payer: Self-pay | Admitting: Cardiovascular Disease

## 2023-11-21 DIAGNOSIS — I10 Essential (primary) hypertension: Secondary | ICD-10-CM

## 2023-11-21 DIAGNOSIS — I340 Nonrheumatic mitral (valve) insufficiency: Secondary | ICD-10-CM

## 2023-11-21 DIAGNOSIS — Z01810 Encounter for preprocedural cardiovascular examination: Secondary | ICD-10-CM

## 2023-11-21 DIAGNOSIS — R0789 Other chest pain: Secondary | ICD-10-CM

## 2023-11-23 ENCOUNTER — Ambulatory Visit (INDEPENDENT_AMBULATORY_CARE_PROVIDER_SITE_OTHER)

## 2023-11-23 VITALS — BP 108/72 | HR 80 | Temp 98.2°F | Ht 63.0 in | Wt 179.2 lb

## 2023-11-23 DIAGNOSIS — E669 Obesity, unspecified: Secondary | ICD-10-CM | POA: Diagnosis not present

## 2023-11-23 DIAGNOSIS — E538 Deficiency of other specified B group vitamins: Secondary | ICD-10-CM | POA: Diagnosis not present

## 2023-11-23 DIAGNOSIS — F39 Unspecified mood [affective] disorder: Secondary | ICD-10-CM

## 2023-11-23 DIAGNOSIS — E876 Hypokalemia: Secondary | ICD-10-CM | POA: Diagnosis not present

## 2023-11-23 DIAGNOSIS — Z7985 Long-term (current) use of injectable non-insulin antidiabetic drugs: Secondary | ICD-10-CM

## 2023-11-23 DIAGNOSIS — D509 Iron deficiency anemia, unspecified: Secondary | ICD-10-CM

## 2023-11-23 DIAGNOSIS — Z7984 Long term (current) use of oral hypoglycemic drugs: Secondary | ICD-10-CM

## 2023-11-23 DIAGNOSIS — G4733 Obstructive sleep apnea (adult) (pediatric): Secondary | ICD-10-CM

## 2023-11-23 DIAGNOSIS — E1169 Type 2 diabetes mellitus with other specified complication: Secondary | ICD-10-CM | POA: Diagnosis not present

## 2023-11-23 DIAGNOSIS — K5730 Diverticulosis of large intestine without perforation or abscess without bleeding: Secondary | ICD-10-CM

## 2023-11-23 MED ORDER — MOUNJARO 10 MG/0.5ML ~~LOC~~ SOAJ: 10.0000 mg | SUBCUTANEOUS | 3 refills | Status: DC

## 2023-11-23 MED ORDER — DULOXETINE HCL 60 MG PO CPEP: 60.0000 mg | ORAL_CAPSULE | Freq: Every day | ORAL | 3 refills | Status: AC

## 2023-11-23 MED ORDER — EMPAGLIFLOZIN 25 MG PO TABS: 25.0000 mg | ORAL_TABLET | Freq: Every day | ORAL | 3 refills | Status: AC

## 2023-11-23 MED ORDER — METFORMIN HCL 500 MG PO TABS: 500.0000 mg | ORAL_TABLET | Freq: Every day | ORAL | 3 refills | Status: AC

## 2023-11-23 NOTE — Assessment & Plan Note (Signed)
Compliant on CPAP

## 2023-11-23 NOTE — Assessment & Plan Note (Signed)
 Diverticulosis identified during recent colonoscopy with no new polyps. Repeat colonoscopy in 5 years, repeat July 2030.

## 2023-11-23 NOTE — Assessment & Plan Note (Addendum)
 Completed three iron  infusions with improved energy levels. Continue f/u with hematologist Dr. Babara

## 2023-11-23 NOTE — Assessment & Plan Note (Signed)
 Chronic issue with stable mood on Cymbalta  60 mg (2 tab of 30 mg daily).  Sent refill for Cymbalta  60 mg, take one tab daily for patient convenience.

## 2023-11-23 NOTE — Assessment & Plan Note (Signed)
 On Potassium 10 meq bid. Check BMP today, if low normal recommend continuing with once daily potassium 10 meq, if low 10 meq BID, if high normal discontinue Potassium supplement.

## 2023-11-23 NOTE — Assessment & Plan Note (Addendum)
 Type 2 diabetes mellitus with hyperlipidemia, obesity.  Blood sugar levels well-controlled with A1c in target range. No hypoglycemia reported.  Repeat A1c, urine microalbumin today.  Reduce Metformin  from 500 mg BID to 500 mg once daily given excellent blood glucose control. Continue Jardiance  25 daily.  Continue Mounjaro  10 mg once weekly injection.  Discussed potential side effects of increasing Mounjaro  dose, including bloating, acid reflux, and hypoglycemia. Decision made to maintain current Mounjaro  dose and focus on physical activity for weight management.

## 2023-11-23 NOTE — Patient Instructions (Signed)
--   We are going down on the dose of Metformin  from 500 mg twice a day to 500 mg once a day.  -- Take Duloxetine  60 mg, 1 capsule daily. This is a new prescription and it has been sent to your pharmacy.  -- I will update you on lab results once I have labs for A1c, B12, urine microalbumin, BMP.

## 2023-11-23 NOTE — Assessment & Plan Note (Signed)
 Currently taking OTC B12 supplement with improved B12 level. Repeat B12 lab today.

## 2023-11-23 NOTE — Progress Notes (Signed)
 Established Patient Office Visit   Subjective  Patient ID: Shelby Estes, female    DOB: 1955/12/26  Age: 68 y.o. 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  HPI Patient presents for chronic medication management/follow up.   1) Type II Diabetes:  Patient's last A1c was 5.2% on 08/11/23.  She currently takes Jardiance  25, Metformin  500 mg BID, Mounjaro  10 mg weekly.  Home blood glucose: has been around 110, no episodes of hypoglycemia. She is due for urine microalbumin.    2) She has a h/o iron  deficiency anemia. She was referred to heme/onc and has seen Dr. Babara. During her office visit with Dr. Babara on 09/08/23. She received 3 infusion of Venofer . She has upcoming appointment with Dr. Babara on 12/11/2023.   She underwent colonoscopy on 09/17/2023 at [facility]. She was found to have diverticulosis in the sigmoid colon. She is recommended for repeat colonoscopy in 5 years (after 09/16/2028).   3) She follows up with Dr. Denyse Bathe for cardiology. Currently is prescribed Losartan  25 mg daily, Furosemide  20 mg prn.   4) She is taking Duloxetine  60 mg daily and reports mood has been stable on this.   5) For GERD she takes Protonix  40 mg mostly once daily in the morning. She occasionally takes Protonix  40 mg twice a day.   6) Patient reports since her last visit with me she has noted significant improvement in vaginal pruritus.   7) She has a h/o hypokalemia, currently  she is on Kclor 10 meq BID.    8) She has a h/o vitamin B 12 deficiency, with level as low as 66 on 03/26/2021. She has been taking B12 supplement and is due for a repeat lab.   ROS As per HPI    Objective:     BP 108/72 (BP Location: Right Arm, Patient Position: Sitting, Cuff Size: Normal)   Pulse 80   Temp 98.2 F (36.8 C) (Oral)   Ht 5' 3 (1.6 m)   Wt 179 lb 3.2 oz (81.3 kg)   SpO2 99%   BMI 31.74 kg/m      11/23/2023    4:03 PM 09/23/2023    8:03 AM 09/09/2023    7:00 AM  Depression screen PHQ 2/9  Decreased Interest 0 0 0  Down, Depressed, Hopeless 0 0 0  PHQ - 2 Score 0 0 0  Altered sleeping 1    Tired, decreased energy 1    Change in appetite 0    Feeling bad or failure about yourself  0    Trouble concentrating 0    Moving slowly or fidgety/restless 0    Suicidal thoughts 0    PHQ-9 Score 2    Difficult doing work/chores Not difficult at all        11/23/2023    4:03 PM 08/11/2023    3:10 PM 03/13/2023   10:13 AM 09/10/2022   10:12 AM  GAD 7 : Generalized Anxiety Score  Nervous, Anxious, on Edge 0 0 1 2  Control/stop worrying 0 0 0 2  Worry too much - different  things 0 0 0 2  Trouble relaxing 0 0 0 1  Restless 0 0 0 0  Easily annoyed or irritable 0 0 0 1  Afraid - awful might happen 0 0 0 1  Total GAD 7 Score 0 0 1 9  Anxiety Difficulty Not difficult at all Not difficult at all Not difficult at all Somewhat difficult      11/23/2023    4:03 PM 09/23/2023    8:03 AM 09/09/2023    7:00 AM  Depression screen PHQ 2/9  Decreased Interest 0 0 0  Down, Depressed, Hopeless 0 0 0  PHQ - 2 Score 0 0 0  Altered sleeping 1    Tired, decreased energy 1    Change in appetite 0    Feeling bad or failure about yourself  0    Trouble concentrating 0    Moving slowly or fidgety/restless 0    Suicidal thoughts 0    PHQ-9 Score 2    Difficult doing work/chores Not difficult at all        11/23/2023    4:03 PM 08/11/2023    3:10 PM 03/13/2023   10:13 AM 09/10/2022    10:12 AM  GAD 7 : Generalized Anxiety Score  Nervous, Anxious, on Edge 0 0 1 2  Control/stop worrying 0 0 0 2  Worry too much - different things 0 0 0 2  Trouble relaxing 0 0 0 1  Restless 0 0 0 0  Easily annoyed or irritable 0 0 0 1  Afraid - awful might happen 0 0 0 1  Total GAD 7 Score 0 0 1 9  Anxiety Difficulty Not difficult at all Not difficult at all Not difficult at all Somewhat difficult   SDOH Screenings   Food Insecurity: No Food Insecurity (11/11/2023)  Housing: Unknown (11/11/2023)  Transportation Needs: No Transportation Needs (11/11/2023)  Utilities: Not At Risk (09/08/2023)  Alcohol Screen: Low Risk  (03/30/2023)  Depression (PHQ2-9): Low Risk  (11/23/2023)  Financial Resource Strain: Low Risk  (11/11/2023)  Physical Activity: Inactive (11/11/2023)  Social Connections: Unknown (11/11/2023)  Stress: No Stress Concern Present (11/11/2023)  Tobacco Use: Low Risk  (11/23/2023)  Health Literacy: Adequate Health Literacy (09/08/2023)     Physical Exam Constitutional:      General: She is not in acute distress.    Appearance: Normal appearance.  HENT:     Head: Normocephalic and atraumatic.     Mouth/Throat:     Mouth: Mucous membranes are moist.  Neck:     Thyroid : No thyroid  mass or thyroid  tenderness.  Cardiovascular:     Rate and Rhythm: Normal rate and regular rhythm.  Pulmonary:     Effort: Pulmonary effort is normal.     Breath sounds: Normal breath sounds. No wheezing.  Abdominal:     General: Bowel sounds are normal.     Palpations: Abdomen is soft.     Tenderness: There is no abdominal tenderness. There is no guarding.  Musculoskeletal:     Cervical back: Neck supple. No rigidity.     Right lower leg: No edema.     Left lower leg: No edema.  Skin:    General: Skin is warm.  Neurological:     Mental Status: She is alert and oriented to person, place, and time.  Psychiatric:        Mood and Affect: Mood normal.        Behavior: Behavior normal.  No results found for any visits on 11/23/23.  The ASCVD Risk score (Arnett DK, et al., 2019) failed to calculate for the following reasons:   The valid total cholesterol range is 130 to 320 mg/dL     Assessment & Plan:   Type 2 diabetes mellitus with obesity (HCC) Assessment & Plan: Type 2 diabetes mellitus with hyperlipidemia, obesity.  Blood sugar levels well-controlled with A1c in target range. No hypoglycemia reported.  Repeat A1c, urine microalbumin today.  Reduce Metformin  from 500 mg BID to 500 mg once daily given excellent blood glucose control. Continue Jardiance  25 daily.  Continue Mounjaro  10 mg once weekly injection.  Discussed potential side effects of increasing Mounjaro  dose, including bloating, acid reflux, and hypoglycemia. Decision made to maintain current Mounjaro  dose and focus on physical activity for weight management.   Orders: -     Mounjaro ; Inject 10 mg into the skin once a week.  Dispense: 6 mL; Refill: 3 -     Hemoglobin A1c -     Microalbumin / creatinine urine ratio  B12 deficiency Assessment & Plan: Currently taking OTC B12 supplement with improved B12 level. Repeat B12 lab today.  Orders: -     Vitamin B12  Hypokalemia Assessment & Plan: On Potassium 10 meq bid. Check BMP today, if low normal recommend continuing with once daily potassium 10 meq, if low 10 meq BID, if high normal discontinue Potassium supplement.   Orders: -     Basic metabolic panel with GFR  Mood disorder (HCC) Assessment & Plan: Chronic issue with stable mood on Cymbalta  60 mg (2 tab of 30 mg daily).  Sent refill for Cymbalta  60 mg, take one tab daily for patient convenience.    OSA on CPAP Assessment & Plan: Compliant on CPAP.    Iron  deficiency anemia, unspecified iron  deficiency anemia type Assessment & Plan: Completed three iron  infusions with improved energy levels. Continue f/u with hematologist Dr. Babara    Diverticulosis of colon Assessment  & Plan: Diverticulosis identified during recent colonoscopy with no new polyps. Repeat colonoscopy in 5 years, repeat July 2030.   Other orders -     DULoxetine  HCl; Take 1 capsule (60 mg total) by mouth daily.  Dispense: 90 capsule; Refill: 3 -     Empagliflozin ; Take 1 tablet (25 mg total) by mouth daily.  Dispense: 90 tablet; Refill: 3 -     metFORMIN  HCl; Take 1 tablet (500 mg total) by mouth daily with breakfast.  Dispense: 90 tablet; Refill: 3    Return in about 6 months (around 05/22/2024) for chronic follow up, labs 2 days before appointment.   Luke Shade, MD

## 2023-11-24 ENCOUNTER — Ambulatory Visit: Payer: Self-pay

## 2023-11-24 DIAGNOSIS — Z79899 Other long term (current) drug therapy: Secondary | ICD-10-CM

## 2023-11-24 DIAGNOSIS — E1169 Type 2 diabetes mellitus with other specified complication: Secondary | ICD-10-CM

## 2023-11-24 DIAGNOSIS — E876 Hypokalemia: Secondary | ICD-10-CM

## 2023-11-24 LAB — MICROALBUMIN / CREATININE URINE RATIO
Creatinine,U: 67.7 mg/dL
Microalb Creat Ratio: UNDETERMINED mg/g (ref 0.0–30.0)
Microalb, Ur: <0.7 mg/dL

## 2023-11-24 LAB — BASIC METABOLIC PANEL WITH GFR
BUN: 13 mg/dL (ref 6–23)
CO2: 26 meq/L (ref 19–32)
Calcium: 9.6 mg/dL (ref 8.4–10.5)
Chloride: 106 meq/L (ref 96–112)
Creatinine, Ser: 0.91 mg/dL (ref 0.40–1.20)
GFR: 65.19 mL/min (ref >60.00)
Glucose, Bld: 87 mg/dL (ref 70–99)
Potassium: 3.7 meq/L (ref 3.5–5.1)
Sodium: 142 meq/L (ref 135–145)

## 2023-11-24 LAB — HEMOGLOBIN A1C: Hgb A1c MFr Bld: 6.1 % (ref 4.6–6.5)

## 2023-11-24 LAB — VITAMIN B12: Vitamin B-12: >1500 pg/mL — ABNORMAL HIGH (ref 211–911)

## 2023-11-24 NOTE — Progress Notes (Signed)
 1. Type 2 diabetes mellitus with obesity (HCC) (Primary) - HgB A1c; Future  2. Hypokalemia - Potassium; Future  3. Medication management - B12; Future

## 2023-11-26 DIAGNOSIS — H02831 Dermatochalasis of right upper eyelid: Secondary | ICD-10-CM | POA: Diagnosis not present

## 2023-11-26 DIAGNOSIS — H57813 Brow ptosis, bilateral: Secondary | ICD-10-CM | POA: Diagnosis not present

## 2023-11-28 ENCOUNTER — Other Ambulatory Visit: Payer: Self-pay | Admitting: Cardiovascular Disease

## 2023-11-30 DIAGNOSIS — M19022 Primary osteoarthritis, left elbow: Secondary | ICD-10-CM | POA: Diagnosis not present

## 2023-11-30 DIAGNOSIS — Z471 Aftercare following joint replacement surgery: Secondary | ICD-10-CM | POA: Diagnosis not present

## 2023-11-30 DIAGNOSIS — Z96652 Presence of left artificial knee joint: Secondary | ICD-10-CM | POA: Diagnosis not present

## 2023-11-30 DIAGNOSIS — Z96651 Presence of right artificial knee joint: Secondary | ICD-10-CM | POA: Diagnosis not present

## 2023-12-08 ENCOUNTER — Inpatient Hospital Stay: Attending: Oncology

## 2023-12-08 DIAGNOSIS — D509 Iron deficiency anemia, unspecified: Secondary | ICD-10-CM | POA: Diagnosis not present

## 2023-12-08 DIAGNOSIS — G5601 Carpal tunnel syndrome, right upper limb: Secondary | ICD-10-CM | POA: Diagnosis not present

## 2023-12-08 DIAGNOSIS — Z800 Family history of malignant neoplasm of digestive organs: Secondary | ICD-10-CM | POA: Diagnosis not present

## 2023-12-08 DIAGNOSIS — D500 Iron deficiency anemia secondary to blood loss (chronic): Secondary | ICD-10-CM

## 2023-12-08 DIAGNOSIS — Z803 Family history of malignant neoplasm of breast: Secondary | ICD-10-CM | POA: Insufficient documentation

## 2023-12-08 DIAGNOSIS — M19022 Primary osteoarthritis, left elbow: Secondary | ICD-10-CM | POA: Diagnosis not present

## 2023-12-08 DIAGNOSIS — Z1231 Encounter for screening mammogram for malignant neoplasm of breast: Secondary | ICD-10-CM | POA: Diagnosis not present

## 2023-12-08 DIAGNOSIS — M19041 Primary osteoarthritis, right hand: Secondary | ICD-10-CM | POA: Diagnosis not present

## 2023-12-08 LAB — CBC WITH DIFFERENTIAL (CANCER CENTER ONLY)
Abs Immature Granulocytes: 0.06 K/uL (ref 0.00–0.07)
Basophils Absolute: 0.1 K/uL (ref 0.0–0.1)
Basophils Relative: 1 %
Eosinophils Absolute: 0.1 K/uL (ref 0.0–0.5)
Eosinophils Relative: 2 %
HCT: 38.1 % (ref 36.0–46.0)
Hemoglobin: 12.4 g/dL (ref 12.0–15.0)
Immature Granulocytes: 1 %
Lymphocytes Relative: 23 %
Lymphs Abs: 1.7 K/uL (ref 0.7–4.0)
MCH: 28.4 pg (ref 26.0–34.0)
MCHC: 32.5 g/dL (ref 30.0–36.0)
MCV: 87.2 fL (ref 80.0–100.0)
Monocytes Absolute: 0.4 K/uL (ref 0.1–1.0)
Monocytes Relative: 6 %
Neutro Abs: 4.9 K/uL (ref 1.7–7.7)
Neutrophils Relative %: 67 %
Platelet Count: 363 K/uL (ref 150–400)
RBC: 4.37 MIL/uL (ref 3.87–5.11)
RDW: 15.2 % (ref 11.5–15.5)
WBC Count: 7.2 K/uL (ref 4.0–10.5)
nRBC: 0 % (ref 0.0–0.2)

## 2023-12-08 LAB — RETIC PANEL
Immature Retic Fract: 7.4 % (ref 2.3–15.9)
RBC.: 4.33 MIL/uL (ref 3.87–5.11)
Retic Count, Absolute: 69.3 K/uL (ref 19.0–186.0)
Retic Ct Pct: 1.6 % (ref 0.4–3.1)
Reticulocyte Hemoglobin: 33.2 pg (ref >27.9)

## 2023-12-08 LAB — IRON AND TIBC
Iron: 61 ug/dL (ref 28–170)
Saturation Ratios: 18 % (ref 10.4–31.8)
TIBC: 333 ug/dL (ref 250–450)
UIBC: 272 ug/dL

## 2023-12-08 LAB — HM MAMMOGRAPHY

## 2023-12-08 LAB — FERRITIN: Ferritin: 54 ng/mL (ref 11–307)

## 2023-12-09 ENCOUNTER — Inpatient Hospital Stay: Attending: Oncology

## 2023-12-11 ENCOUNTER — Encounter: Payer: Self-pay | Admitting: Oncology

## 2023-12-11 ENCOUNTER — Inpatient Hospital Stay: Admitting: Oncology

## 2023-12-11 ENCOUNTER — Inpatient Hospital Stay

## 2023-12-11 VITALS — BP 136/78 | HR 68 | Temp 97.8°F | Resp 16 | Wt 180.0 lb

## 2023-12-11 DIAGNOSIS — D509 Iron deficiency anemia, unspecified: Secondary | ICD-10-CM

## 2023-12-11 NOTE — Progress Notes (Signed)
 Hematology/Oncology Consult note [phone] [phone]      Patient Care Team: Bair, Kalpana, MD as PCP - General (Family Medicine) Cleotilde Barrio, MD (Orthopedic Surgery) Vannie Delon LABOR, MD (Internal Medicine) Dessa, Reyes ORN, MD (General Surgery) Babara Call, MD as Consulting Physician (Hematology and Oncology)   REFERRING PROVIDER: Abbey Bruckner, MD  CHIEF COMPLAINTS/REASON FOR VISIT:  Anemia  ASSESSMENT & PLAN:  Iron  deficiency anemia Lab Results  Component Value Date   HGB 12.4 12/08/2023   TIBC 333 12/08/2023   IRONPCTSAT 18 12/08/2023   FERRITIN 54 12/08/2023    Hemoglobin and iron  panel are both normalized.  Hold off additional IV Venofer  treatment.  Etiology of iron  deficient anemia, suspect GI blood loss due to previous NSAID use Currently scheduled NSAIDs.   No orders of the defined types were placed in this encounter.  Follow-up PRN All questions were answered. The patient knows to call the clinic with any problems, questions or concerns.  Call Babara, MD, PhD [facility] Hematology Oncology 12/11/2023     HISTORY OF PRESENTING ILLNESS:  Shelby Estes is a  68 y.o.  female with PMH listed below who was referred to me for anemia Reviewed patient's recent labs that was done.  She was found to have abnormal CBC on 09/02/2023 with hemoglobin of 10.7, MCV 88.4, iron  panel showed iron  saturation of 8, ferritin of 17, TIBC 356. Reviewed patient's previous labs ordered by primary care physician's office, anemia is new onset, duration is since May 2025. Patient reports that she had bilateral knee surgery.she has taken meloxicam for a period of time.  Currently off any NSAIDs.   She denies recent chest pain on exertion, shortness of breath on minimal exertion, pre-syncopal episodes, or palpitations.  Sometimes she feels fatigued.  She feels quite well after mobility improves after knee replacement. She had not noticed any recent bleeding  such as epistaxis, hematuria or hematochezia.  She denies over the counter NSAID ingestion. She is not on antiplatelets agents. Her last colonoscopy was with [facility] in 2022. Patient's primary care provider has prescribed oral iron  supplementation which she has not started.  She has concerned about potential side effects as she has constipation at baseline.  08/11/2023, B12 level 344.  She takes vitamin B12 supplementation.  INTERVAL HISTORY Shelby Estes is a 67 y.o. female who has above history reviewed by me today presents for follow up visit for  iron  deficiency anemia.  She tolerated IV venofer . Fatigue has improved.   MEDICAL HISTORY:  Past Medical History:  Diagnosis Date   Anxiety    Depression    Diabetes mellitus 2010   Elevated BP    GERD (gastroesophageal reflux disease)    Hypertension    Kidney stone    Dr. Jodi, April 2014   Knee pain 02/22/2016   Osteoarthritis of knee 02/22/2016   Palpitations 01/31/2016   Renal cyst    Dr. 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   REPLACEMENT TOTAL KNEE  06/16/2023   TUBAL LIGATION  02/21/2001    SOCIAL HISTORY: Social History   Socioeconomic History   Marital status: Married    Spouse name: Not on file   Number of children:  0   Years of education: Not on file   Highest education level: Associate degree: academic program  Occupational History   Occupation: Producer, television/film/video: OTHER   Occupation: retired  Tobacco Use   Smoking status: Never   Smokeless tobacco: Never  Vaping Use   Vaping status: Never Used  Substance and Sexual Activity   Alcohol use: No   Drug use: No   Sexual activity: Yes    Birth control/protection: Surgical  Other Topics Concern   Not on file  Social History Narrative   Lives with husband, dog. Work - Mudlogger. Step children. Hobbies - Quilting, gardening      Regular Exercise -  NO   Daily Caffeine Use:  1-2 cups coffee, 1-2 soda/tea               Social Drivers of Health   Financial Resource Strain: Low Risk  (11/11/2023)   Overall Financial Resource Strain (CARDIA)    Difficulty of Paying Living Expenses: Not very hard  Food Insecurity: No Food Insecurity (11/11/2023)   Hunger Vital Sign    Worried About Running Out of Food in the Last Year: Never true    Ran Out of Food in the Last Year: Never true  Transportation Needs: No Transportation Needs (11/11/2023)   PRAPARE - Administrator, Civil Service (Medical): No    Lack of Transportation (Non-Medical): No  Physical Activity: Inactive (11/11/2023)   Exercise Vital Sign    Days of Exercise per Week: 0 days    Minutes of Exercise per Session: Not on file  Stress: No Stress Concern Present (11/11/2023)   [employer] of Occupational Health - Occupational Stress Questionnaire    Feeling of Stress: Only a little  Social Connections: Unknown (11/11/2023)   Social Connection and Isolation Panel    Frequency of Communication with Friends and Family: Once a week    Frequency of Social Gatherings with Friends and Family: Patient declined    Attends Religious Services: More than 4 times per year    Active Member of [employer] or Organizations: No     Attends Engineer, structural: Not on file    Marital Status: Married  Catering manager Violence: Not At Risk (09/08/2023)   Humiliation, Afraid, Rape, and Kick questionnaire    Fear of Current or Ex-Partner: No    Emotionally Abused: No    Physically Abused: No    Sexually Abused: No    FAMILY HISTORY: Family History  Problem Relation Age of Onset   Diabetes Mother        borderline   Hypertension Mother    Thyroid  disease Mother    COPD Father    Stroke Father    Colon cancer Sister        31   Breast cancer Maternal Aunt    Seizures Maternal Grandmother    Cancer Cousin    Colon cancer Cousin        40's   Breast cancer  Other    Cancer Other        breast    ALLERGIES:  is allergic to bupropion , clarithromycin, enalapril , phentermine , ceclor [cefaclor], penicillins, and sulfa antibiotics.  MEDICATIONS:  Current Outpatient Medications  Medication Sig Dispense Refill   ADULT ASPIRIN REGIMEN 81 MG EC tablet      Alcohol Swabs (B-D SINGLE USE SWABS REGULAR) PADS Use daily to clean skin prior to testing/injection 100 each 3   Blood Glucose Monitoring Suppl (TRUE METRIX AIR GLUCOSE METER) w/Device KIT 1 each by Does not apply route daily. 1 kit 0   Cholecalciferol (VITAMIN D ) 2000 UNITS CAPS Take 1 capsule by mouth daily.     DULoxetine  (CYMBALTA ) 60 MG capsule Take 1 capsule (60 mg total) by mouth daily. 90 capsule 3   empagliflozin  (JARDIANCE ) 25 MG TABS tablet Take 1 tablet (25 mg total) by mouth daily. 90 tablet 3   furosemide  (LASIX ) 20 MG tablet Take 1 tab every other day PRN (Patient taking differently: as needed. Take 1 tab every other day PRN) 30 tablet 2   glucose blood (GNP TRUE METRIX GLUCOSE STRIPS) test strip Check once daily. 100 each 12   losartan  (COZAAR ) 25 MG tablet TAKE ONE TABLET BY MOUTH ONCE DAILY 30 tablet 2   metFORMIN  (GLUCOPHAGE ) 500 MG tablet Take 1 tablet (500 mg total) by mouth daily with breakfast. 90 tablet 3   metoprolol  tartrate  (LOPRESSOR ) 25 MG tablet TAKE ONE (1) TABLET BY MOUTH TWO TIMES PER DAY 180 tablet 0   pantoprazole  (PROTONIX ) 40 MG tablet TAKE ONE TABLET BY MOUTH TWICE DAILY BEFORE MEALS 60 tablet 3   polyethylene glycol powder (GLYCOLAX /MIRALAX ) 17 GM/SCOOP powder Take 17 g by mouth daily as needed for mild constipation. 500 g 0   potassium chloride  (MICRO-K ) 10 MEQ CR capsule Take 1 capsule (10 mEq total) by mouth 2 (two) times daily. 180 capsule 0   rosuvastatin  (CRESTOR ) 10 MG tablet Take 1 tablet (10 mg total) by mouth daily. 90 tablet 3   tirzepatide  (MOUNJARO ) 10 MG/0.5ML Pen Inject 10 mg into the skin once a week. 6 mL 3   TRUEplus Lancets 30G MISC TEST BLOOD SUGAR twice DAILY 200 each 3   No current facility-administered medications for this visit.    Review of Systems  Constitutional:  Positive for fatigue. Negative for appetite change, chills and fever.  HENT:   Negative for hearing loss and voice change.   Eyes:  Negative for eye problems.  Respiratory:  Negative for chest tightness and cough.   Cardiovascular:  Negative for chest pain.  Gastrointestinal:  Negative for abdominal distention, abdominal pain and blood in stool.  Endocrine: Negative for hot flashes.  Genitourinary:  Negative for difficulty urinating and frequency.   Musculoskeletal:  Negative for arthralgias.       Bilateral knee replacement  Skin:  Negative for itching and rash.  Neurological:  Negative for extremity weakness.  Hematological:  Negative for adenopathy.  Psychiatric/Behavioral:  Negative for confusion.     PHYSICAL EXAMINATION: Vitals:   12/11/23 1157  BP: 136/78  Pulse: 68  Resp: 16  Temp: 97.8 F (36.6 C)  SpO2: 100%   Filed Weights   12/11/23 1157  Weight: 180 lb (81.6 kg)    Physical Exam Constitutional:      General: She is not in acute distress. HENT:     Head: Normocephalic and atraumatic.  Eyes:     General: No scleral icterus. Cardiovascular:     Rate  and Rhythm: Normal rate.   Pulmonary:     Effort: Pulmonary effort is normal. No respiratory distress.  Abdominal:     General: There is no distension.  Musculoskeletal:        General: Normal range of motion.     Cervical back: Normal range of motion and neck supple.  Skin:    Findings: No erythema or rash.  Neurological:     Mental Status: She is alert and oriented to person, place, and time. Mental status is at baseline.  Psychiatric:        Mood and Affect: Mood normal.      LABORATORY DATA:  I have reviewed the data as listed    Latest Ref Rng & Units 12/08/2023   12:59 PM 09/02/2023    8:09 AM 08/11/2023    4:17 PM  CBC  WBC 4.0 - 10.5 K/uL 7.2  8.3  9.4   Hemoglobin 12.0 - 15.0 g/dL 87.5  89.2  89.0   Hematocrit 36.0 - 46.0 % 38.1  33.1  34.6   Platelets 150 - 400 K/uL 363  321.0  513       Latest Ref Rng & Units 11/23/2023    4:19 PM 09/22/2023   10:58 AM 09/02/2023    8:09 AM  CMP  Glucose 70 - 99 mg/dL 87   891   BUN 6 - 23 mg/dL 13   17   Creatinine 9.59 - 1.20 mg/dL 9.08   9.11   Sodium 864 - 145 mEq/L 142   141   Potassium 3.5 - 5.1 mEq/L 3.7  3.9  3.3   Chloride 96 - 112 mEq/L 106   105   CO2 19 - 32 mEq/L 26   30   Calcium  8.4 - 10.5 mg/dL 9.6   9.6    Lab Results  Component Value Date   IRON  61 12/08/2023   TIBC 333 12/08/2023   IRONPCTSAT 18 12/08/2023   FERRITIN 54 12/08/2023     RADIOGRAPHIC STUDIES: I have personally reviewed the radiological images as listed and agreed with the findings in the report. No results found.

## 2023-12-11 NOTE — Assessment & Plan Note (Signed)
 Lab Results  Component Value Date   HGB 12.4 12/08/2023   TIBC 333 12/08/2023   IRONPCTSAT 18 12/08/2023   FERRITIN 54 12/08/2023    Hemoglobin and iron  panel are both normalized.  Hold off additional IV Venofer  treatment.  Etiology of iron  deficient anemia, suspect GI blood loss due to previous NSAID use Currently scheduled NSAIDs.

## 2023-12-16 DIAGNOSIS — E119 Type 2 diabetes mellitus without complications: Secondary | ICD-10-CM | POA: Diagnosis not present

## 2023-12-17 ENCOUNTER — Ambulatory Visit: Admitting: Cardiovascular Disease

## 2023-12-17 IMAGING — MR MR ABDOMEN WO/W CM
20 series · 48 of 48 positions shown · IV contrast (10ml Gadavist)
Comparison: CT chest 05/21/2021

CLINICAL DATA: Indeterminate renal lesion

EXAM:
MRI ABDOMEN WITHOUT AND WITH CONTRAST
TECHNIQUE: Multiplanar multisequence MR imaging of the abdomen was performed
both before and after the administration of intravenous contrast.
CONTRAST:  10mL GADAVIST GADOBUTROL 1 MMOL/ML IV SOLN

[Series 3: T2 · coronal · 6.0mm · 1.19mm/px · 1 of 30 slices shown (1 of 2)]
[im 1/30]
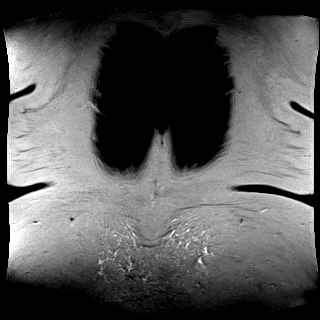

[Series 4: T2 · axial · 6.0mm · 1.19mm/px · 1 of 34 slices shown (2 of 2)]
[im 1/34]
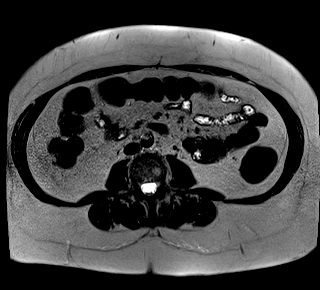

[Series 6: T2 fat-sat · axial · 6.0mm · 1.19mm/px · 1 of 34 slices shown]
[im 1/34]
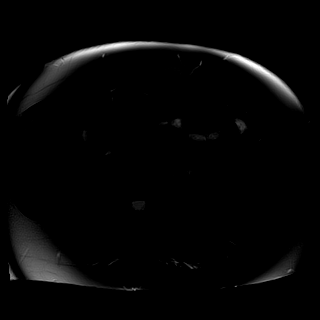

[Series 7: ax dwi_tracew · axial · 6.0mm · 1.42mm/px · z∈[-78,+160]mm · 3 of 102 slices shown]
[im 1/102]
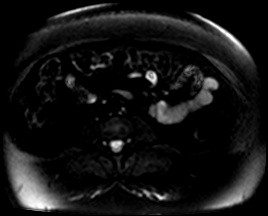
[im 51/102]
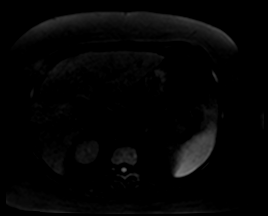
[im 102/102]
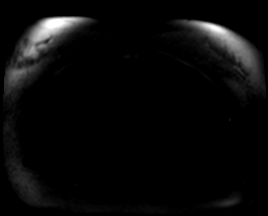

[Series 8: ax dwi_adc · axial · 6.0mm · 1.42mm/px · 1 of 34 slices shown]
[im 1/34]
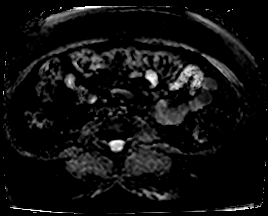

[Series 9: in & out · axial · 3.0mm · 1.19mm/px · z∈[-74,+163]mm · 2 of 80 slices shown (1 of 2)]
[im 1/80]
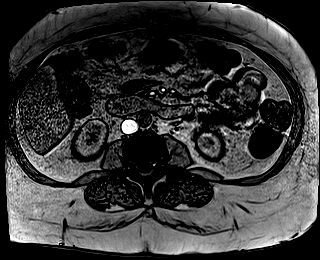
[im 80/80]
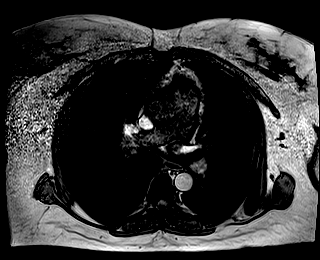

[Series 10: in & out · axial · 3.0mm · 1.19mm/px · z∈[-74,+163]mm · 2 of 80 slices shown (2 of 2)]
[im 1/80]
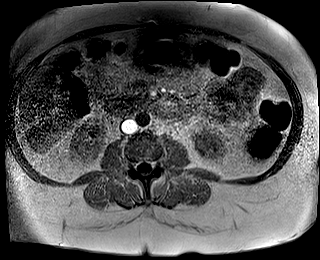
[im 80/80]
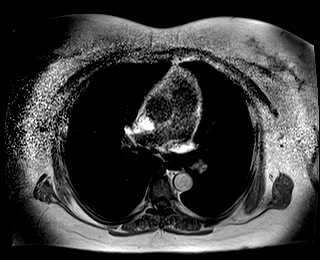

[Series 11: bSSFP · axial · 6.0mm · 0.74mm/px · 1 of 34 slices shown]
[im 1/34]
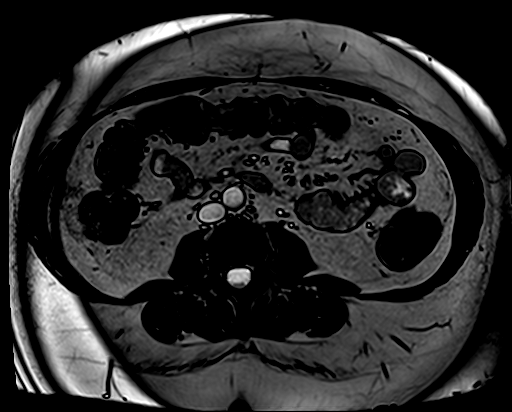

[Series 12: T1 dynamic fat-sat · axial · non-contrast · 3.0mm · 1.19mm/px · z∈[-74,+163]mm · 3 of 80 slices shown (1 of 6)]
[im 1/80]
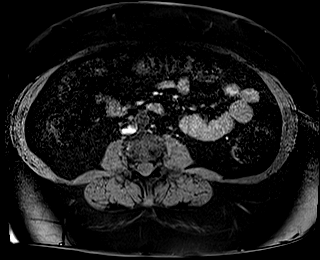
[im 40/80]
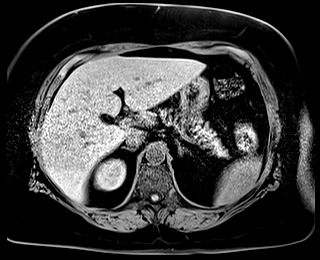
[im 80/80]
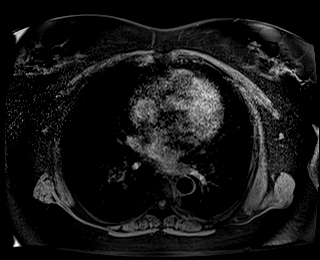

[Series 13: T1 dynamic fat-sat post-contrast · axial · 3.0mm · 1.19mm/px · z∈[-74,+163]mm · 3 of 80 slices shown (1 of 5)]
[im 1/80]
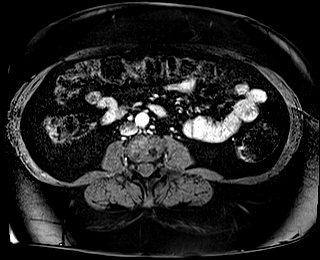
[im 40/80]
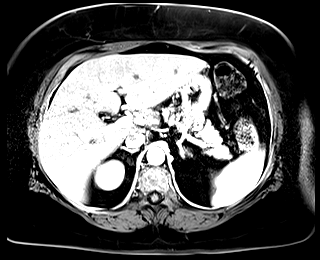
[im 80/80]
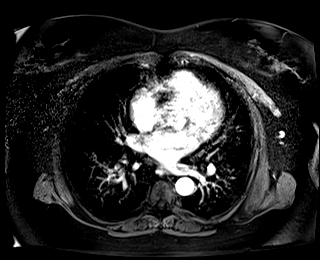

[Series 14: T1 dynamic fat-sat · axial · 3.0mm · 1.19mm/px · z∈[-74,+163]mm · 3 of 80 slices shown (2 of 6)]
[im 1/80]
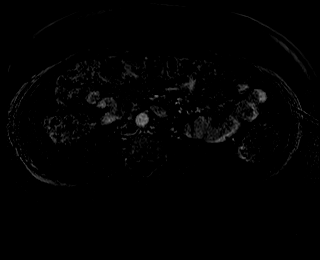
[im 40/80]
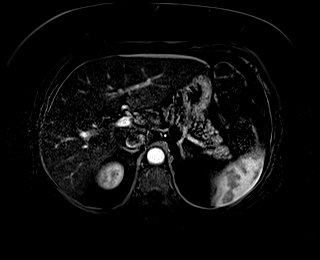
[im 80/80]
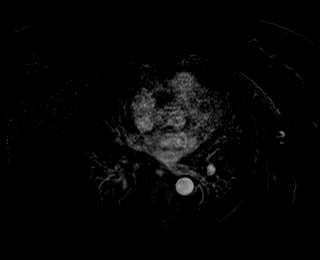

[Series 15: T1 dynamic fat-sat post-contrast · axial · 3.0mm · 1.19mm/px · z∈[-74,+163]mm · 3 of 80 slices shown (2 of 5)]
[im 1/80]
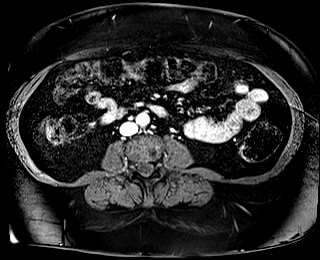
[im 40/80]
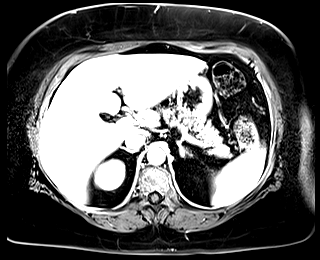
[im 80/80]
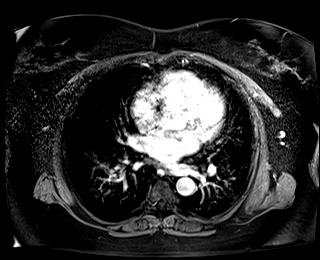

[Series 16: T1 dynamic fat-sat · axial · 3.0mm · 1.19mm/px · z∈[-74,+163]mm · 3 of 80 slices shown (3 of 6)]
[im 1/80]
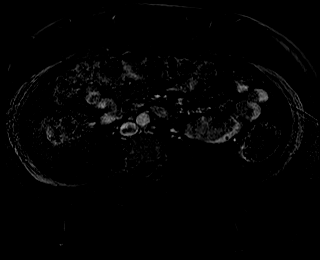
[im 40/80]
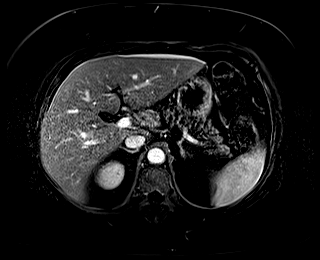
[im 80/80]
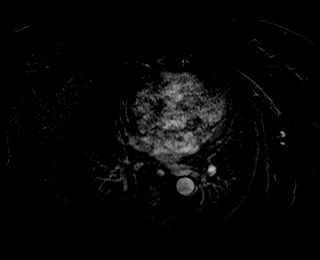

[Series 17: T1 dynamic fat-sat post-contrast · axial · 3.0mm · 1.19mm/px · z∈[-74,+163]mm · 3 of 80 slices shown (3 of 5)]
[im 1/80]
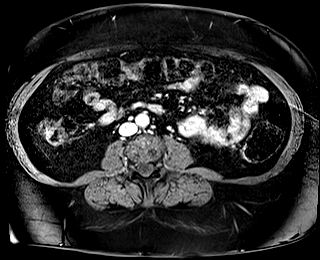
[im 40/80]
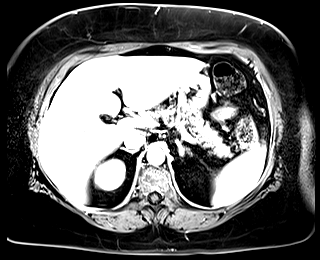
[im 80/80]
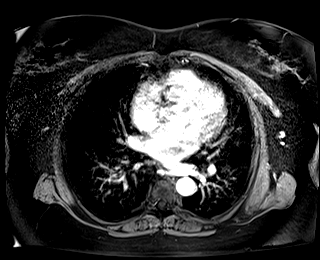

[Series 18: T1 dynamic fat-sat · axial · 3.0mm · 1.19mm/px · z∈[-74,+163]mm · 3 of 80 slices shown (4 of 6)]
[im 1/80]
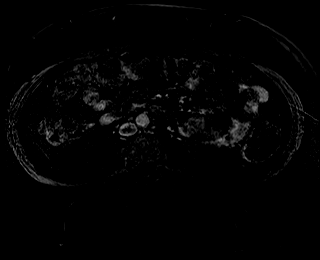
[im 40/80]
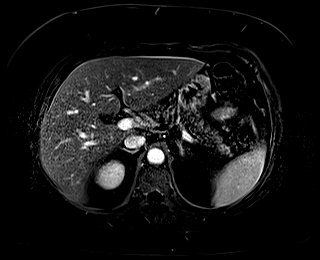
[im 80/80]
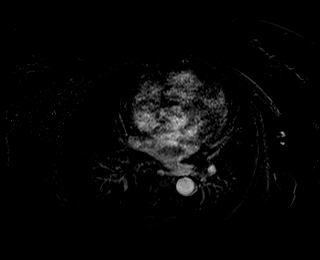

[Series 19: T1 dynamic post-contrast · coronal · 3.0mm · 1.31mm/px · 3 of 72 slices shown]
[im 1/72]
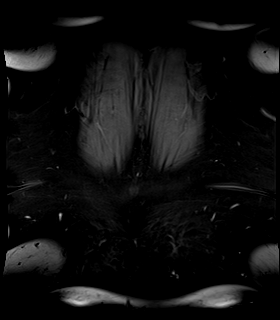
[im 36/72]
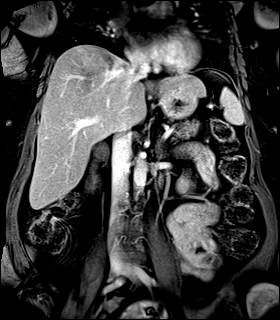
[im 72/72]
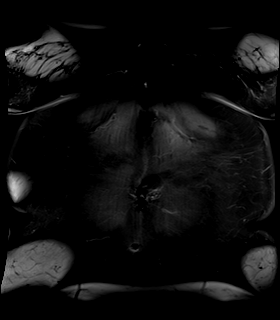

[Series 20: T1 dynamic fat-sat post-contrast · axial · 3.0mm · 1.19mm/px · z∈[-74,+163]mm · 3 of 80 slices shown (4 of 5)]
[im 1/80]
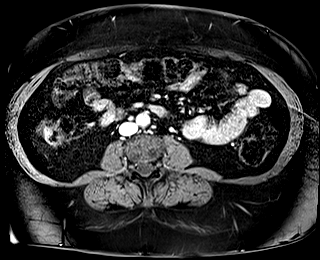
[im 40/80]
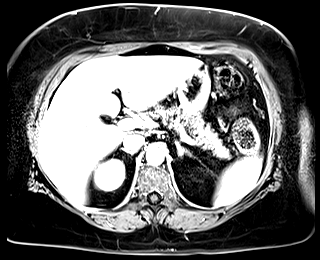
[im 80/80]
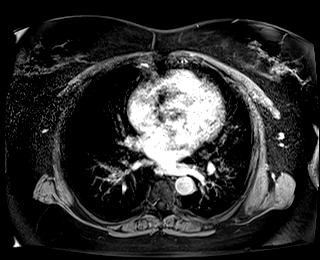

[Series 21: T1 dynamic fat-sat · axial · 3.0mm · 1.19mm/px · z∈[-74,+163]mm · 3 of 80 slices shown (5 of 6)]
[im 1/80]
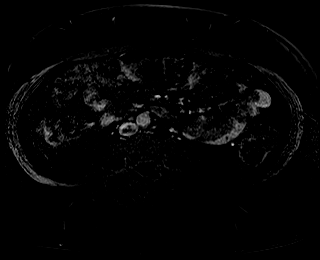
[im 40/80]
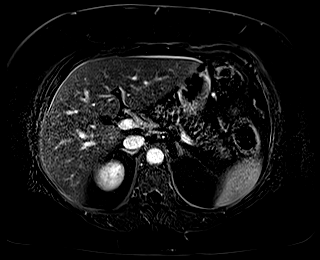
[im 80/80]
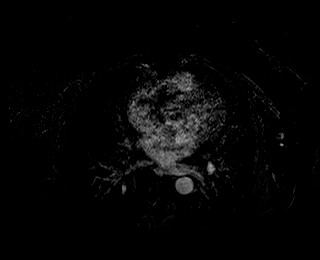

[Series 22: T1 dynamic fat-sat post-contrast · axial · 3.0mm · 1.19mm/px · z∈[-74,+163]mm · 3 of 80 slices shown (5 of 5)]
[im 1/80]
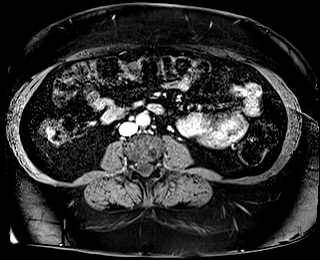
[im 40/80]
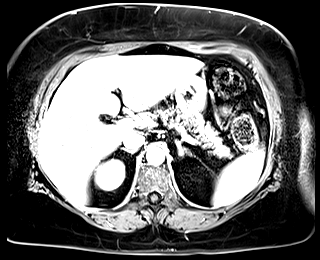
[im 80/80]
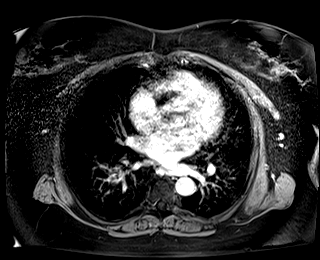

[Series 23: T1 dynamic fat-sat · axial · 3.0mm · 1.19mm/px · z∈[-74,+163]mm · 3 of 80 slices shown (6 of 6)]
[im 1/80]
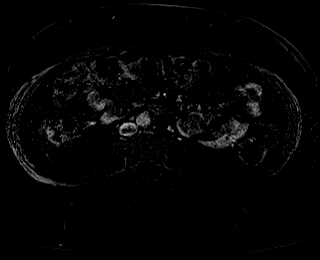
[im 40/80]
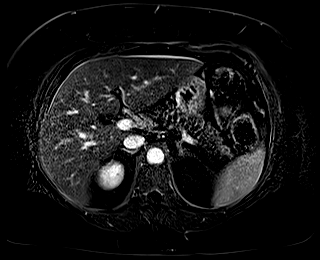
[im 80/80]
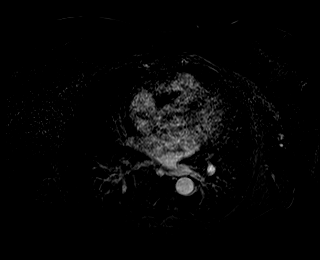

[48 of 48 positions shown; findings below may reference images not displayed]

FINDINGS: Lower chest: No acute findings.

Hepatobiliary: Liver is enlarged measuring 22.1 cm in length. There
is evidence of hepatic steatosis. No suspicious hepatic mass
identified. Gallbladder is surgically absent. No biliary ductal
dilatation.

Pancreas: No mass, inflammatory changes, or other parenchymal
abnormality identified.

Spleen:  Within normal limits in size and appearance.

Adrenals/Urinary Tract: Adrenal glands appear normal. 2.9 cm
exophytic cyst at the anterior upper pole right kidney demonstrates
mildly increased T1 signal suggesting proteinaceous/hemorrhagic
material, with no suspicious postcontrast enhancement. Small
parapelvic cysts bilaterally. No hydronephrosis.

Stomach/Bowel: Visualized portions within the abdomen are
unremarkable.

Vascular/Lymphatic: No pathologically enlarged lymph nodes
identified. No abdominal aortic aneurysm demonstrated.

Other: No ascites. 2.7 cm midline anterior abdominal wall hernia
containing fat.

Musculoskeletal: No suspicious bone lesions identified.
IMPRESSION: 1. No suspicious renal mass identified. Cyst in the upper pole right
kidney which is mildly proteinaceous/hemorrhagic.
2. Hepatomegaly and hepatic steatosis.
3. Midline anterior abdominal wall hernia containing fat.

## 2023-12-18 ENCOUNTER — Ambulatory Visit: Admitting: Cardiovascular Disease

## 2023-12-18 ENCOUNTER — Encounter: Payer: Self-pay | Admitting: Cardiovascular Disease

## 2023-12-18 VITALS — BP 132/78 | HR 72 | Ht 63.0 in | Wt 179.6 lb

## 2023-12-18 DIAGNOSIS — R0602 Shortness of breath: Secondary | ICD-10-CM

## 2023-12-18 DIAGNOSIS — K219 Gastro-esophageal reflux disease without esophagitis: Secondary | ICD-10-CM | POA: Diagnosis not present

## 2023-12-18 DIAGNOSIS — I340 Nonrheumatic mitral (valve) insufficiency: Secondary | ICD-10-CM

## 2023-12-18 DIAGNOSIS — E119 Type 2 diabetes mellitus without complications: Secondary | ICD-10-CM

## 2023-12-18 DIAGNOSIS — I10 Essential (primary) hypertension: Secondary | ICD-10-CM | POA: Diagnosis not present

## 2023-12-18 DIAGNOSIS — E785 Hyperlipidemia, unspecified: Secondary | ICD-10-CM

## 2023-12-18 NOTE — Progress Notes (Signed)
 Cardiology Office Note   Date:  12/18/2023   ID:  Shelby Estes, DOB May 08, 1955, MRN [id number]  PCP:  Abbey Bruckner, MD  Cardiologist:  Denyse Bathe, MD      History of Present Illness: Shelby Estes is a 68 y.o. female who presents for  Chief Complaint  Patient presents with   Follow-up    3 month follow up    Doing well.      Past Medical History:  Diagnosis Date   Anxiety    Depression    Diabetes mellitus 2010   Elevated BP    GERD (gastroesophageal reflux disease)    Hypertension    Kidney stone    Dr. Jodi, April 2014   Knee pain 02/22/2016   Osteoarthritis of knee 02/22/2016   Palpitations 01/31/2016   Renal cyst    Dr. 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 Shelda Pinal   Arm Surgery     due to fracture left elbow   CHOLECYSTECTOMY  07/15/2005   Dr Dessa   COLONOSCOPY  03/17/2005   Dr Dessa   COLONOSCOPY WITH PROPOFOL  N/A 06/20/2015   Procedure: COLONOSCOPY WITH PROPOFOL ;  Surgeon: Reyes LELON Dessa, MD;  Location: [facility] ENDOSCOPY;  Service: Endoscopy;  Laterality: N/A;   REPLACEMENT TOTAL KNEE  04/07/2023   REPLACEMENT TOTAL KNEE  06/16/2023   TUBAL LIGATION  02/21/2001     Current Outpatient Medications  Medication Sig Dispense Refill   ADULT ASPIRIN REGIMEN 81 MG EC tablet      Alcohol Swabs (B-D SINGLE USE SWABS REGULAR) PADS Use daily to clean skin prior to testing/injection 100 each 3   Blood Glucose Monitoring Suppl (TRUE METRIX AIR GLUCOSE METER) w/Device KIT 1 each by Does not apply route daily. 1 kit 0   Cholecalciferol (VITAMIN D ) 2000 UNITS CAPS Take 1 capsule by mouth daily.     DULoxetine   (CYMBALTA ) 60 MG capsule Take 1 capsule (60 mg total) by mouth daily. 90 capsule 3   empagliflozin  (JARDIANCE ) 25 MG TABS tablet Take 1 tablet (25 mg total) by mouth daily. 90 tablet 3   furosemide  (LASIX ) 20 MG tablet Take 1 tab every other day PRN (Patient taking differently: as needed. Take 1 tab every other day PRN) 30 tablet 2   glucose blood (GNP TRUE METRIX GLUCOSE STRIPS) test strip Check once daily. 100 each 12   losartan  (COZAAR ) 25 MG tablet TAKE ONE TABLET BY MOUTH ONCE DAILY 30 tablet 2   metFORMIN  (GLUCOPHAGE ) 500 MG tablet Take 1 tablet (500 mg total) by mouth daily with breakfast. 90 tablet 3   metoprolol  tartrate (LOPRESSOR ) 25 MG tablet TAKE ONE (1) TABLET BY MOUTH TWO TIMES PER DAY 180 tablet 0   pantoprazole  (PROTONIX ) 40 MG tablet TAKE ONE TABLET BY MOUTH TWICE DAILY BEFORE MEALS 60 tablet 3   polyethylene glycol powder (GLYCOLAX /MIRALAX ) 17 GM/SCOOP powder Take 17 g by mouth daily as needed for mild constipation. 500 g 0   rosuvastatin  (CRESTOR ) 10 MG tablet Take 1 tablet (10 mg total) by mouth daily. 90 tablet 3   tirzepatide  (MOUNJARO ) 10 MG/0.5ML Pen Inject 10 mg into the skin once a week. 6 mL 3  TRUEplus Lancets 30G MISC TEST BLOOD SUGAR twice DAILY 200 each 3   potassium chloride  (MICRO-K ) 10 MEQ CR capsule Take 1 capsule (10 mEq total) by mouth 2 (two) times daily. (Patient not taking: Reported on 12/18/2023) 180 capsule 0   No current facility-administered medications for this visit.    Allergies:   Bupropion , Clarithromycin, Enalapril , Phentermine , Ceclor [cefaclor], Penicillins, and Sulfa antibiotics    Social History:   reports that she has never smoked. She has never used smokeless tobacco. She reports that she does not drink alcohol and does not use drugs.   Family History:  family history includes Breast cancer in her maternal aunt and another family member; COPD in her father; Cancer in her cousin and another family member; Colon cancer in her cousin and  sister; Diabetes in her mother; Hypertension in her mother; Seizures in her maternal grandmother; Stroke in her father; Thyroid  disease in her mother.    ROS:     Review of Systems  Constitutional: Negative.   HENT: Negative.    Eyes: Negative.   Respiratory: Negative.    Gastrointestinal: Negative.   Genitourinary: Negative.   Musculoskeletal: Negative.   Skin: Negative.   Neurological: Negative.   Endo/Heme/Allergies: Negative.   Psychiatric/Behavioral: Negative.    All other systems reviewed and are negative.     All other systems are reviewed and negative.    PHYSICAL EXAM: VS:  BP 132/78   Pulse 72   Ht 5' 3 (1.6 m)   Wt 179 lb 9.6 oz (81.5 kg)   SpO2 98%   BMI 31.81 kg/m  , BMI Body mass index is 31.81 kg/m. Last weight:  Wt Readings from Last 3 Encounters:  12/18/23 179 lb 9.6 oz (81.5 kg)  12/11/23 180 lb (81.6 kg)  11/23/23 179 lb 3.2 oz (81.3 kg)     Physical Exam Constitutional:      Appearance: Normal appearance.  Cardiovascular:     Rate and Rhythm: Normal rate and regular rhythm.     Heart sounds: Normal heart sounds.  Pulmonary:     Effort: Pulmonary effort is normal.     Breath sounds: Normal breath sounds.  Musculoskeletal:     Right lower leg: No edema.     Left lower leg: No edema.  Neurological:     Mental Status: She is alert.       EKG:   Recent Labs: 09/22/2023: Magnesium 1.8 11/23/2023: BUN 13; Creatinine, Ser 0.91; Potassium 3.7; Sodium 142 12/08/2023: Hemoglobin 12.4; Platelet Count 363    Lipid Panel    Component Value Date/Time   CHOL 107 06/06/2022 1402   TRIG 146 06/06/2022 1402   HDL 46 06/06/2022 1402   CHOLHDL 2.3 06/06/2022 1402   CHOLHDL 3 05/03/2021 1037   VLDL 43.4 (H) 05/03/2021 1037   LDLCALC 36 06/06/2022 1402   LDLDIRECT 50.0 05/03/2021 1037      Other studies Reviewed: Additional studies/ records that were reviewed today include:  Review of the above records demonstrates:       No data to  display            ASSESSMENT AND PLAN:    ICD-10-CM   1. SOB (shortness of breath)  R06.02 PCV ECHOCARDIOGRAM COMPLETE   has HEpEF, lasix  PRN and on jaurdiance. Has diastolic dysfunction.    2. Primary hypertension  I10 PCV ECHOCARDIOGRAM COMPLETE    3. Gastroesophageal reflux disease, unspecified whether esophagitis present  K21.9 PCV ECHOCARDIOGRAM COMPLETE    4.  Hyperlipidemia, unspecified hyperlipidemia type  E78.5 PCV ECHOCARDIOGRAM COMPLETE    5. Nonrheumatic mitral valve regurgitation  I34.0 PCV ECHOCARDIOGRAM COMPLETE       Problem List Items Addressed This Visit       Cardiovascular and Mediastinum   Hypertension (Chronic)   Relevant Orders   PCV ECHOCARDIOGRAM COMPLETE     Digestive   GERD (gastroesophageal reflux disease)   Relevant Orders   PCV ECHOCARDIOGRAM COMPLETE     Other   Hyperlipidemia   Relevant Orders   PCV ECHOCARDIOGRAM COMPLETE   Other Visit Diagnoses       SOB (shortness of breath)    -  Primary   has HEpEF, lasix  PRN and on jaurdiance. Has diastolic dysfunction.   Relevant Orders   PCV ECHOCARDIOGRAM COMPLETE     Nonrheumatic mitral valve regurgitation       Relevant Orders   PCV ECHOCARDIOGRAM COMPLETE          Disposition:   Return for echo and f/u.    Total time spent: 30 minutes  Signed,  Denyse Bathe, MD  12/18/2023 10:05 AM    [facility]

## 2024-01-15 ENCOUNTER — Ambulatory Visit

## 2024-01-15 DIAGNOSIS — I340 Nonrheumatic mitral (valve) insufficiency: Secondary | ICD-10-CM

## 2024-01-15 DIAGNOSIS — I10 Essential (primary) hypertension: Secondary | ICD-10-CM

## 2024-01-15 DIAGNOSIS — R0602 Shortness of breath: Secondary | ICD-10-CM

## 2024-01-15 DIAGNOSIS — I361 Nonrheumatic tricuspid (valve) insufficiency: Secondary | ICD-10-CM

## 2024-01-15 DIAGNOSIS — I351 Nonrheumatic aortic (valve) insufficiency: Secondary | ICD-10-CM | POA: Diagnosis not present

## 2024-01-15 DIAGNOSIS — K219 Gastro-esophageal reflux disease without esophagitis: Secondary | ICD-10-CM

## 2024-01-15 DIAGNOSIS — E785 Hyperlipidemia, unspecified: Secondary | ICD-10-CM

## 2024-01-16 DIAGNOSIS — E119 Type 2 diabetes mellitus without complications: Secondary | ICD-10-CM | POA: Diagnosis not present

## 2024-01-25 ENCOUNTER — Encounter: Payer: Self-pay | Admitting: Cardiovascular Disease

## 2024-01-25 DIAGNOSIS — E119 Type 2 diabetes mellitus without complications: Secondary | ICD-10-CM

## 2024-01-25 MED ORDER — TIRZEPATIDE 12.5 MG/0.5ML ~~LOC~~ SOAJ: 12.5000 mg | SUBCUTANEOUS | 1 refills | Status: DC

## 2024-01-26 ENCOUNTER — Ambulatory Visit: Admitting: Cardiovascular Disease

## 2024-01-26 ENCOUNTER — Encounter: Payer: Self-pay | Admitting: Cardiovascular Disease

## 2024-01-26 VITALS — BP 125/78 | HR 82 | Ht 63.0 in | Wt 181.4 lb

## 2024-01-26 DIAGNOSIS — I340 Nonrheumatic mitral (valve) insufficiency: Secondary | ICD-10-CM

## 2024-01-26 DIAGNOSIS — R0789 Other chest pain: Secondary | ICD-10-CM | POA: Diagnosis not present

## 2024-01-26 DIAGNOSIS — I5021 Acute systolic (congestive) heart failure: Secondary | ICD-10-CM

## 2024-01-26 DIAGNOSIS — E785 Hyperlipidemia, unspecified: Secondary | ICD-10-CM

## 2024-01-26 DIAGNOSIS — I10 Essential (primary) hypertension: Secondary | ICD-10-CM | POA: Diagnosis not present

## 2024-01-26 DIAGNOSIS — R0602 Shortness of breath: Secondary | ICD-10-CM | POA: Diagnosis not present

## 2024-01-26 MED ORDER — METOPROLOL SUCCINATE ER 50 MG PO TB24: 50.0000 mg | ORAL_TABLET | Freq: Every day | ORAL | 11 refills | Status: AC

## 2024-01-26 MED ORDER — SACUBITRIL-VALSARTAN 24-26 MG PO TABS: 1.0000 | ORAL_TABLET | Freq: Two times a day (BID) | ORAL | 3 refills | Status: AC

## 2024-01-26 NOTE — Progress Notes (Signed)
 Cardiology Office Note   Date:  01/26/2024   ID:  JULANN MCGILVRAY, DOB 1955/12/12, MRN [id number]  PCP:  Abbey Bruckner, MD  Cardiologist:  Denyse Bathe, MD      History of Present Illness: Shelby Estes is a 68 y.o. female who presents for  Chief Complaint  Patient presents with   Follow-up    Follow up    No SOB, occasional tightness in chest on exertion.      Past Medical History:  Diagnosis Date   Anxiety    Depression    Diabetes mellitus 2010   Elevated BP    GERD (gastroesophageal reflux disease)    Hypertension    Kidney stone    Dr. Jodi, April 2014   Knee pain 02/22/2016   Osteoarthritis of knee 02/22/2016   Palpitations 01/31/2016   Renal cyst    Dr. 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 Shelda Pinal   Arm Surgery     due to fracture left elbow   CHOLECYSTECTOMY  07/15/2005   Dr Dessa   COLONOSCOPY  03/17/2005   Dr Dessa   COLONOSCOPY WITH PROPOFOL  N/A 06/20/2015   Procedure: COLONOSCOPY WITH PROPOFOL ;  Surgeon: Reyes LELON Dessa, MD;  Location: [facility] ENDOSCOPY;  Service: Endoscopy;  Laterality: N/A;   REPLACEMENT TOTAL KNEE  04/07/2023   REPLACEMENT TOTAL KNEE  06/16/2023   TUBAL LIGATION  02/21/2001     Current Outpatient Medications  Medication Sig Dispense Refill   ADULT ASPIRIN REGIMEN 81 MG EC tablet      Alcohol Swabs (B-D SINGLE USE SWABS REGULAR) PADS Use daily to clean skin prior to testing/injection 100 each 3   Blood Glucose Monitoring Suppl (TRUE METRIX AIR GLUCOSE METER) w/Device KIT 1 each by Does not apply route daily. 1 kit 0   Cholecalciferol (VITAMIN D ) 2000 UNITS CAPS Take 1 capsule by  mouth daily.     DULoxetine  (CYMBALTA ) 60 MG capsule Take 1 capsule (60 mg total) by mouth daily. 90 capsule 3   empagliflozin  (JARDIANCE ) 25 MG TABS tablet Take 1 tablet (25 mg total) by mouth daily. 90 tablet 3   furosemide  (LASIX ) 20 MG tablet Take 1 tab every other day PRN (Patient taking differently: as needed. Take 1 tab every other day PRN) 30 tablet 2   glucose blood (GNP TRUE METRIX GLUCOSE STRIPS) test strip Check once daily. 100 each 12   metFORMIN  (GLUCOPHAGE ) 500 MG tablet Take 1 tablet (500 mg total) by mouth daily with breakfast. 90 tablet 3   metoprolol  succinate (TOPROL  XL) 50 MG 24 hr tablet Take 1 tablet (50 mg total) by mouth daily. Take with or immediately following a meal. 30 tablet 11   pantoprazole  (PROTONIX ) 40 MG tablet TAKE ONE TABLET BY MOUTH TWICE DAILY BEFORE MEALS 60 tablet 3   polyethylene glycol powder (GLYCOLAX /MIRALAX ) 17 GM/SCOOP powder Take 17 g by mouth daily as needed for mild constipation. 500 g 0   rosuvastatin  (CRESTOR ) 10 MG tablet Take 1 tablet (10 mg total) by mouth daily. 90 tablet 3   sacubitril-valsartan (ENTRESTO) 24-26 MG Take 1 tablet by mouth 2 (two) times daily. 60 tablet 3   tirzepatide  (MOUNJARO ) 12.5 MG/0.5ML  Pen Inject 12.5 mg into the skin once a week. 6 mL 1   TRUEplus Lancets 30G MISC TEST BLOOD SUGAR twice DAILY 200 each 3   potassium chloride  (MICRO-K ) 10 MEQ CR capsule Take 1 capsule (10 mEq total) by mouth 2 (two) times daily. (Patient not taking: Reported on 01/26/2024) 180 capsule 0   No current facility-administered medications for this visit.    Allergies:   Bupropion , Clarithromycin, Enalapril , Phentermine , Ceclor [cefaclor], Penicillins, and Sulfa antibiotics    Social History:   reports that she has never smoked. She has never used smokeless tobacco. She reports that she does not drink alcohol and does not use drugs.   Family History:  family history includes Breast cancer in her maternal aunt and another family member;  COPD in her father; Cancer in her cousin and another family member; Colon cancer in her cousin and sister; Diabetes in her mother; Hypertension in her mother; Seizures in her maternal grandmother; Stroke in her father; Thyroid  disease in her mother.    ROS:     Review of Systems  Constitutional: Negative.   HENT: Negative.    Eyes: Negative.   Respiratory: Negative.    Gastrointestinal: Negative.   Genitourinary: Negative.   Musculoskeletal: Negative.   Skin: Negative.   Neurological: Negative.   Endo/Heme/Allergies: Negative.   Psychiatric/Behavioral: Negative.    All other systems reviewed and are negative.     All other systems are reviewed and negative.    PHYSICAL EXAM: VS:  BP 125/78   Pulse 82   Ht 5' 3 (1.6 m)   Wt 181 lb 6.4 oz (82.3 kg)   SpO2 99%   BMI 32.13 kg/m  , BMI Body mass index is 32.13 kg/m. Last weight:  Wt Readings from Last 3 Encounters:  01/26/24 181 lb 6.4 oz (82.3 kg)  12/18/23 179 lb 9.6 oz (81.5 kg)  12/11/23 180 lb (81.6 kg)     Physical Exam Constitutional:      Appearance: Normal appearance.  Cardiovascular:     Rate and Rhythm: Normal rate and regular rhythm.     Heart sounds: Normal heart sounds.  Pulmonary:     Effort: Pulmonary effort is normal.     Breath sounds: Normal breath sounds.  Musculoskeletal:     Right lower leg: No edema.     Left lower leg: No edema.  Neurological:     Mental Status: She is alert.       EKG:   Recent Labs: 09/22/2023: Magnesium 1.8 11/23/2023: BUN 13; Creatinine, Ser 0.91; Potassium 3.7; Sodium 142 12/08/2023: Hemoglobin 12.4; Platelet Count 363    Lipid Panel    Component Value Date/Time   CHOL 107 06/06/2022 1402   TRIG 146 06/06/2022 1402   HDL 46 06/06/2022 1402   CHOLHDL 2.3 06/06/2022 1402   CHOLHDL 3 05/03/2021 1037   VLDL 43.4 (H) 05/03/2021 1037   LDLCALC 36 06/06/2022 1402   LDLDIRECT 50.0 05/03/2021 1037      Other studies Reviewed: Additional studies/ records that  were reviewed today include:  Review of the above records demonstrates:       No data to display            ASSESSMENT AND PLAN:    ICD-10-CM   1. SOB (shortness of breath)  R06.02 sacubitril-valsartan (ENTRESTO) 24-26 MG    metoprolol  succinate (TOPROL  XL) 50 MG 24 hr tablet    MYOCARDIAL PERFUSION IMAGING   Has LVEF 49%, advise changing losartan  to  entresto    2. Nonrheumatic mitral valve regurgitation  I34.0 sacubitril-valsartan (ENTRESTO) 24-26 MG    metoprolol  succinate (TOPROL  XL) 50 MG 24 hr tablet    MYOCARDIAL PERFUSION IMAGING    3. Mitral valve insufficiency, unspecified etiology  I34.0 sacubitril-valsartan (ENTRESTO) 24-26 MG    metoprolol  succinate (TOPROL  XL) 50 MG 24 hr tablet    MYOCARDIAL PERFUSION IMAGING    4. Other chest pain  R07.89 sacubitril-valsartan (ENTRESTO) 24-26 MG    metoprolol  succinate (TOPROL  XL) 50 MG 24 hr tablet    MYOCARDIAL PERFUSION IMAGING   Has chest pain, advise stress test    5. Primary hypertension  I10 sacubitril-valsartan (ENTRESTO) 24-26 MG    metoprolol  succinate (TOPROL  XL) 50 MG 24 hr tablet    MYOCARDIAL PERFUSION IMAGING    6. Hyperlipidemia, unspecified hyperlipidemia type  E78.5 sacubitril-valsartan (ENTRESTO) 24-26 MG    metoprolol  succinate (TOPROL  XL) 50 MG 24 hr tablet    MYOCARDIAL PERFUSION IMAGING    7. CHF (congestive heart failure), NYHA class I, acute, systolic (HCC)  I50.21        Problem List Items Addressed This Visit       Cardiovascular and Mediastinum   Hypertension (Chronic)   Relevant Medications   sacubitril-valsartan (ENTRESTO) 24-26 MG   metoprolol  succinate (TOPROL  XL) 50 MG 24 hr tablet   Other Relevant Orders   MYOCARDIAL PERFUSION IMAGING     Other   Hyperlipidemia   Relevant Medications   sacubitril-valsartan (ENTRESTO) 24-26 MG   metoprolol  succinate (TOPROL  XL) 50 MG 24 hr tablet   Other Relevant Orders   MYOCARDIAL PERFUSION IMAGING   Other Visit Diagnoses       SOB  (shortness of breath)    -  Primary   Has LVEF 49%, advise changing losartan  to entresto   Relevant Medications   sacubitril-valsartan (ENTRESTO) 24-26 MG   metoprolol  succinate (TOPROL  XL) 50 MG 24 hr tablet   Other Relevant Orders   MYOCARDIAL PERFUSION IMAGING     Nonrheumatic mitral valve regurgitation       Relevant Medications   sacubitril-valsartan (ENTRESTO) 24-26 MG   metoprolol  succinate (TOPROL  XL) 50 MG 24 hr tablet   Other Relevant Orders   MYOCARDIAL PERFUSION IMAGING     Mitral valve insufficiency, unspecified etiology       Relevant Medications   sacubitril-valsartan (ENTRESTO) 24-26 MG   metoprolol  succinate (TOPROL  XL) 50 MG 24 hr tablet   Other Relevant Orders   MYOCARDIAL PERFUSION IMAGING     Other chest pain       Has chest pain, advise stress test   Relevant Medications   sacubitril-valsartan (ENTRESTO) 24-26 MG   metoprolol  succinate (TOPROL  XL) 50 MG 24 hr tablet   Other Relevant Orders   MYOCARDIAL PERFUSION IMAGING     CHF (congestive heart failure), NYHA class I, acute, systolic (HCC)       Relevant Medications   sacubitril-valsartan (ENTRESTO) 24-26 MG   metoprolol  succinate (TOPROL  XL) 50 MG 24 hr tablet          Disposition:   Return in about 5 weeks (around 03/01/2024) for stress test and f/u.    Total time spent: 35 minutes  Signed,  Denyse Bathe, MD  01/26/2024 11:12 AM    [facility]

## 2024-02-02 ENCOUNTER — Other Ambulatory Visit: Payer: Self-pay

## 2024-02-02 DIAGNOSIS — E669 Obesity, unspecified: Secondary | ICD-10-CM

## 2024-02-02 MED ORDER — TIRZEPATIDE 12.5 MG/0.5ML ~~LOC~~ SOAJ: 12.5000 mg | SUBCUTANEOUS | 1 refills | Status: AC

## 2024-02-22 DIAGNOSIS — F325 Major depressive disorder, single episode, in full remission: Secondary | ICD-10-CM | POA: Diagnosis not present

## 2024-02-25 ENCOUNTER — Ambulatory Visit

## 2024-02-25 DIAGNOSIS — R0602 Shortness of breath: Secondary | ICD-10-CM | POA: Diagnosis not present

## 2024-02-25 DIAGNOSIS — I10 Essential (primary) hypertension: Secondary | ICD-10-CM

## 2024-02-25 DIAGNOSIS — R0789 Other chest pain: Secondary | ICD-10-CM | POA: Diagnosis not present

## 2024-02-25 DIAGNOSIS — E785 Hyperlipidemia, unspecified: Secondary | ICD-10-CM

## 2024-02-25 DIAGNOSIS — I340 Nonrheumatic mitral (valve) insufficiency: Secondary | ICD-10-CM | POA: Diagnosis not present

## 2024-03-03 ENCOUNTER — Ambulatory Visit: Admitting: Cardiovascular Disease

## 2024-03-04 ENCOUNTER — Encounter: Payer: Self-pay | Admitting: Cardiovascular Disease

## 2024-03-04 ENCOUNTER — Ambulatory Visit: Admitting: Cardiovascular Disease

## 2024-03-04 VITALS — BP 122/66 | HR 77 | Ht 63.0 in | Wt 179.6 lb

## 2024-03-04 DIAGNOSIS — E785 Hyperlipidemia, unspecified: Secondary | ICD-10-CM

## 2024-03-04 DIAGNOSIS — R0602 Shortness of breath: Secondary | ICD-10-CM

## 2024-03-04 DIAGNOSIS — I340 Nonrheumatic mitral (valve) insufficiency: Secondary | ICD-10-CM | POA: Diagnosis not present

## 2024-03-04 DIAGNOSIS — R0789 Other chest pain: Secondary | ICD-10-CM

## 2024-03-04 DIAGNOSIS — I10 Essential (primary) hypertension: Secondary | ICD-10-CM

## 2024-03-04 DIAGNOSIS — I5021 Acute systolic (congestive) heart failure: Secondary | ICD-10-CM | POA: Diagnosis not present

## 2024-03-04 NOTE — Progress Notes (Signed)
 "     Cardiology Office Note   Date:  03/04/2024   ID:  Shelby Estes, DOB 1955-12-19, MRN [id number]  PCP:  Abbey Bruckner, MD  Cardiologist:  Denyse Bathe, MD      History of Present Illness: Shelby Estes is a 68 y.o. female who presents for  Chief Complaint  Patient presents with   Follow-up    NST follow up    Feeling fine.      Past Medical History:  Diagnosis Date   Anxiety    Depression    Diabetes mellitus 2010   Elevated BP    GERD (gastroesophageal reflux disease)    Hypertension    Kidney stone    Dr. Jodi, April 2014   Knee pain 02/22/2016   Osteoarthritis of knee 02/22/2016   Palpitations 01/31/2016   Renal cyst    Dr. 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 Shelda Pinal   Arm Surgery     due to fracture left elbow   CHOLECYSTECTOMY  07/15/2005   Dr Dessa   COLONOSCOPY  03/17/2005   Dr Dessa   COLONOSCOPY WITH PROPOFOL  N/A 06/20/2015   Procedure: COLONOSCOPY WITH PROPOFOL ;  Surgeon: Reyes LELON Dessa, MD;  Location: [facility] ENDOSCOPY;  Service: Endoscopy;  Laterality: N/A;   REPLACEMENT TOTAL KNEE  04/07/2023   REPLACEMENT TOTAL KNEE  06/16/2023   TUBAL LIGATION  02/21/2001     Current Outpatient Medications  Medication Sig Dispense Refill   ADULT ASPIRIN REGIMEN 81 MG EC tablet      Alcohol Swabs (B-D SINGLE USE SWABS REGULAR) PADS Use daily to clean skin prior to testing/injection 100 each 3   Blood Glucose Monitoring Suppl (TRUE METRIX AIR GLUCOSE METER) w/Device KIT 1 each by Does not apply route daily. 1 kit 0   Cholecalciferol (VITAMIN D ) 2000 UNITS CAPS Take 1 capsule by mouth daily.     DULoxetine   (CYMBALTA ) 60 MG capsule Take 1 capsule (60 mg total) by mouth daily. 90 capsule 3   empagliflozin  (JARDIANCE ) 25 MG TABS tablet Take 1 tablet (25 mg total) by mouth daily. 90 tablet 3   furosemide  (LASIX ) 20 MG tablet Take 1 tab every other day PRN (Patient taking differently: as needed. Take 1 tab every other day PRN) 30 tablet 2   glucose blood (GNP TRUE METRIX GLUCOSE STRIPS) test strip Check once daily. 100 each 12   metFORMIN  (GLUCOPHAGE ) 500 MG tablet Take 1 tablet (500 mg total) by mouth daily with breakfast. 90 tablet 3   metoprolol  succinate (TOPROL  XL) 50 MG 24 hr tablet Take 1 tablet (50 mg total) by mouth daily. Take with or immediately following a meal. 30 tablet 11   pantoprazole  (PROTONIX ) 40 MG tablet TAKE ONE TABLET BY MOUTH TWICE DAILY BEFORE MEALS 60 tablet 3   polyethylene glycol powder (GLYCOLAX /MIRALAX ) 17 GM/SCOOP powder Take 17 g by mouth daily as needed for mild constipation. 500 g 0   rosuvastatin  (CRESTOR ) 10 MG tablet Take 1 tablet (10 mg total) by mouth daily. 90 tablet 3   sacubitril -valsartan  (ENTRESTO ) 24-26 MG Take 1 tablet by mouth 2 (two) times daily. 60 tablet 3   tirzepatide  (MOUNJARO ) 12.5 MG/0.5ML Pen Inject  12.5 mg into the skin once a week. 6 mL 1   TRUEplus Lancets 30G MISC TEST BLOOD SUGAR twice DAILY 200 each 3   No current facility-administered medications for this visit.    Allergies:   Bupropion , Clarithromycin, Enalapril , Phentermine , Ceclor [cefaclor], Penicillins, and Sulfa antibiotics    Social History:   reports that she has never smoked. She has never used smokeless tobacco. She reports that she does not drink alcohol and does not use drugs.   Family History:  family history includes Breast cancer in her maternal aunt and another family member; COPD in her father; Cancer in her cousin and another family member; Colon cancer in her cousin and sister; Diabetes in her mother; Hypertension in her mother; Seizures in her maternal grandmother;  Stroke in her father; Thyroid  disease in her mother.    ROS:     Review of Systems  Constitutional: Negative.   HENT: Negative.    Eyes: Negative.   Respiratory: Negative.    Gastrointestinal: Negative.   Genitourinary: Negative.   Musculoskeletal: Negative.   Skin: Negative.   Neurological: Negative.   Endo/Heme/Allergies: Negative.   Psychiatric/Behavioral: Negative.    All other systems reviewed and are negative.     All other systems are reviewed and negative.    PHYSICAL EXAM: VS:  BP 122/66   Pulse 77   Ht 5' 3 (1.6 m)   Wt 179 lb 9.6 oz (81.5 kg)   SpO2 97%   BMI 31.81 kg/m  , BMI Body mass index is 31.81 kg/m. Last weight:  Wt Readings from Last 3 Encounters:  03/04/24 179 lb 9.6 oz (81.5 kg)  01/26/24 181 lb 6.4 oz (82.3 kg)  12/18/23 179 lb 9.6 oz (81.5 kg)     Physical Exam Constitutional:      Appearance: Normal appearance.  Cardiovascular:     Rate and Rhythm: Normal rate and regular rhythm.     Heart sounds: Normal heart sounds.  Pulmonary:     Effort: Pulmonary effort is normal.     Breath sounds: Normal breath sounds.  Musculoskeletal:     Right lower leg: No edema.     Left lower leg: No edema.  Neurological:     Mental Status: She is alert.       EKG:   Recent Labs: 09/22/2023: Magnesium 1.8 11/23/2023: BUN 13; Creatinine, Ser 0.91; Potassium 3.7; Sodium 142 12/08/2023: Hemoglobin 12.4; Platelet Count 363    Lipid Panel    Component Value Date/Time   CHOL 107 06/06/2022 1402   TRIG 146 06/06/2022 1402   HDL 46 06/06/2022 1402   CHOLHDL 2.3 06/06/2022 1402   CHOLHDL 3 05/03/2021 1037   VLDL 43.4 (H) 05/03/2021 1037   LDLCALC 36 06/06/2022 1402   LDLDIRECT 50.0 05/03/2021 1037      Other studies Reviewed: Additional studies/ records that were reviewed today include:  Review of the above records demonstrates:       No data to display            ASSESSMENT AND PLAN:    ICD-10-CM   1. SOB (shortness of breath)   R06.02     2. Nonrheumatic mitral valve regurgitation  I34.0     3. Mitral valve insufficiency, unspecified etiology  I34.0     4. Primary hypertension  I10     5. Hyperlipidemia, unspecified hyperlipidemia type  E78.5     6. CHF (congestive heart failure), NYHA class I, acute, systolic (HCC)  I50.21  LVEF 53%, on entresto .    7. Chest pain, non-cardiac  R07.89    stress test was normal, and LVEF 53%. Continue  protonix .       Problem List Items Addressed This Visit       Cardiovascular and Mediastinum   Hypertension (Chronic)     Other   Hyperlipidemia   Other Visit Diagnoses       SOB (shortness of breath)    -  Primary     Nonrheumatic mitral valve regurgitation         Mitral valve insufficiency, unspecified etiology         CHF (congestive heart failure), NYHA class I, acute, systolic (HCC)       LVEF 53%, on entresto .     Chest pain, non-cardiac       stress test was normal, and LVEF 53%. Continue  protonix .          Disposition:   Return in about 3 months (around 06/02/2024).    Total time spent: 30 minutes  Signed,  Denyse Bathe, MD  03/04/2024 10:09 AM    [facility] "

## 2024-03-21 ENCOUNTER — Ambulatory Visit: Admitting: Cardiovascular Disease

## 2024-03-30 ENCOUNTER — Ambulatory Visit (INDEPENDENT_AMBULATORY_CARE_PROVIDER_SITE_OTHER): Payer: Medicare Other | Admitting: *Deleted

## 2024-03-30 VITALS — BP 120/83 | Ht 63.0 in | Wt 178.0 lb

## 2024-03-30 DIAGNOSIS — Z0000 Encounter for general adult medical examination without abnormal findings: Secondary | ICD-10-CM

## 2024-03-30 NOTE — Patient Instructions (Addendum)
 Shelby Estes,  Thank you for taking the time for your Medicare Wellness Visit. I appreciate your continued commitment to your health goals. Please review the care plan we discussed, and feel free to reach out if I can assist you further.  Please note that Annual Wellness Visits do not include a physical exam. Some assessments may be limited, especially if the visit was conducted virtually. If needed, we may recommend an in-person follow-up with your provider.  Ongoing Care Seeing your primary care provider every 3 to 6 months helps us  monitor your health and provide consistent, personalized care.  Consider updating your fu vaccine.   Referrals If a referral was made during today's visit and you haven't received any updates within two weeks, please contact the referred provider directly to check on the status.  Recommended Screenings:  Health Maintenance  Topic Date Due   Complete foot exam   09/10/2023   Flu Shot  06/14/2024*   Hemoglobin A1C  05/22/2024   Eye exam for diabetics  11/03/2024   Yearly kidney function blood test for diabetes  11/22/2024   Yearly kidney health urinalysis for diabetes  11/22/2024   Breast Cancer Screening  12/07/2024   Medicare Annual Wellness Visit  03/30/2025   Colon Cancer Screening  09/17/2026   DTaP/Tdap/Td vaccine (5 - Td or Tdap) 01/09/2032   Pneumococcal Vaccine for age over 23  Completed   Osteoporosis screening with Bone Density Scan  Completed   Hepatitis C Screening  Completed   Zoster (Shingles) Vaccine  Completed   Meningitis B Vaccine  Aged Out   COVID-19 Vaccine  Discontinued  *Topic was postponed. The date shown is not the original due date.       03/30/2024    2:32 PM  Advanced Directives  Does Patient Have a Medical Advance Directive? No  Would patient like information on creating a medical advance directive? No - Patient declined    Vision: Annual vision screenings are recommended for early detection of glaucoma, cataracts,  and diabetic retinopathy. These exams can also reveal signs of chronic conditions such as diabetes and high blood pressure.  Dental: Annual dental screenings help detect early signs of oral cancer, gum disease, and other conditions linked to overall health, including heart disease and diabetes.  Please see the attached documents for additional preventive care recommendations.

## 2024-03-30 NOTE — Progress Notes (Signed)
 "  Chief Complaint  Patient presents with   Medicare Wellness     Subjective:   Shelby Estes is a 69 y.o. female who presents for a Medicare Annual Wellness Visit.  Visit info / Clinical Intake: Medicare Wellness Visit Type:: Subsequent Annual Wellness Visit Persons participating in visit and providing information:: patient Medicare Wellness Visit Mode:: Video Since this visit was completed virtually, some vitals may be partially provided or unavailable. Missing vitals are due to the limitations of the virtual format.: Documented vitals are patient reported If Telephone or Video please confirm:: I connected with patient using audio/video enable telemedicine. I verified patient identity with two identifiers, discussed telehealth limitations, and patient agreed to proceed. Patient Location:: Home Provider Location:: Office/Home Interpreter Needed?: No Pre-visit prep was completed: yes AWV questionnaire completed by patient prior to visit?: no Living arrangements:: lives with spouse/significant other Patient's Overall Health Status Rating: very good Typical amount of pain: none Does pain affect daily life?: no Are you currently prescribed opioids?: no  Dietary Habits and Nutritional Risks How many meals a day?: 2 Eats fruit and vegetables daily?: (!) no Most meals are obtained by: preparing own meals In the last 2 weeks, have you had any of the following?: none Diabetic:: (!) yes Any non-healing wounds?: no How often do you check your BS?: 2 Would you like to be referred to a Nutritionist or for Diabetic Management? : no  Functional Status Activities of Daily Living (to include ambulation/medication): Independent Ambulation: Independent Medication Administration: Independent Home Management (perform basic housework or laundry): Independent Manage your own finances?: yes Primary transportation is: driving Concerns about vision?: no *vision screening is required for  WTM* Concerns about hearing?: no  Fall Screening Falls in the past year?: 0 Number of falls in past year: 0 Was there an injury with Fall?: 0 Fall Risk Category Calculator: 0 Patient Fall Risk Level: Low Fall Risk  Fall Risk Patient at Risk for Falls Due to: No Fall Risks Fall risk Follow up: Falls evaluation completed; Falls prevention discussed  Home and Transportation Safety: All rugs have non-skid backing?: N/A, no rugs All stairs or steps have railings?: yes Grab bars in the bathtub or shower?: yes Have non-skid surface in bathtub or shower?: yes Good home lighting?: yes Regular seat belt use?: yes Hospital stays in the last year:: (!) yes How many hospital stays:: 2 Reason: knee surgery  Cognitive Assessment Difficulty concentrating, remembering, or making decisions? : no Will 6CIT or Mini Cog be Completed: yes What year is it?: 0 points What month is it?: 0 points Give patient an address phrase to remember (5 components): [address], [place] About what time is it?: 0 points Count backwards from 20 to 1: 0 points Say the months of the year in reverse: 0 points Repeat the address phrase from earlier: 0 points 6 CIT Score: 0 points  Advance Directives (For Healthcare) Does Patient Have a Medical Advance Directive?: No Would patient like information on creating a medical advance directive?: No - Patient declined  Reviewed/Updated  Reviewed/Updated: Reviewed All (Medical, Surgical, Family, Medications, Allergies, Care Teams, Patient Goals)    Allergies (verified) Bupropion , Clarithromycin, Enalapril , Phentermine , Ceclor [cefaclor], Penicillins, and Sulfa antibiotics   Current Medications (verified) Outpatient Encounter Medications as of 03/30/2024  Medication Sig   ADULT ASPIRIN REGIMEN 81 MG EC tablet    Alcohol Swabs (B-D SINGLE USE SWABS REGULAR) PADS Use daily to clean skin prior to testing/injection   Blood Glucose Monitoring Suppl (TRUE  METRIX AIR  GLUCOSE METER) w/Device KIT 1 each by Does not apply route daily.   Cholecalciferol (VITAMIN D ) 2000 UNITS CAPS Take 1 capsule by mouth daily.   DULoxetine  (CYMBALTA ) 60 MG capsule Take 1 capsule (60 mg total) by mouth daily.   empagliflozin  (JARDIANCE ) 25 MG TABS tablet Take 1 tablet (25 mg total) by mouth daily.   furosemide  (LASIX ) 20 MG tablet Take 1 tab every other day PRN (Patient taking differently: daily as needed. Take 1 tab every other day PRN)   glucose blood (GNP TRUE METRIX GLUCOSE STRIPS) test strip Check once daily.   metFORMIN  (GLUCOPHAGE ) 500 MG tablet Take 1 tablet (500 mg total) by mouth daily with breakfast.   metoprolol  succinate (TOPROL  XL) 50 MG 24 hr tablet Take 1 tablet (50 mg total) by mouth daily. Take with or immediately following a meal.   pantoprazole  (PROTONIX ) 40 MG tablet TAKE ONE TABLET BY MOUTH TWICE DAILY BEFORE MEALS   polyethylene glycol powder (GLYCOLAX /MIRALAX ) 17 GM/SCOOP powder Take 17 g by mouth daily as needed for mild constipation.   rosuvastatin  (CRESTOR ) 10 MG tablet Take 1 tablet (10 mg total) by mouth daily.   sacubitril -valsartan  (ENTRESTO ) 24-26 MG Take 1 tablet by mouth 2 (two) times daily.   tirzepatide  (MOUNJARO ) 12.5 MG/0.5ML Pen Inject 12.5 mg into the skin once a week.   TRUEplus Lancets 30G MISC TEST BLOOD SUGAR twice DAILY   No facility-administered encounter medications on file as of 03/30/2024.    History: Past Medical History:  Diagnosis Date   Anxiety    Depression    Diabetes mellitus 2010   Elevated BP    GERD (gastroesophageal reflux disease)    Hypertension    Kidney stone    Dr. Jodi, April 2014   Knee pain 02/22/2016   Osteoarthritis of knee 02/22/2016   Palpitations 01/31/2016   Renal cyst    Dr. 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 Shelda Pinal   Arm Surgery     due to fracture left elbow   CHOLECYSTECTOMY  07/15/2005   Dr Dessa   COLONOSCOPY  03/17/2005   Dr Dessa   COLONOSCOPY WITH PROPOFOL  N/A 06/20/2015   Procedure: COLONOSCOPY WITH PROPOFOL ;  Surgeon: Reyes LELON Dessa, MD;  Location: [facility] ENDOSCOPY;  Service: Endoscopy;  Laterality: N/A;   REPLACEMENT TOTAL KNEE  04/07/2023   REPLACEMENT TOTAL KNEE  06/16/2023   TUBAL LIGATION  02/21/2001   Family History  Problem Relation Age of Onset   Diabetes Mother        borderline   Hypertension Mother    Thyroid  disease Mother    COPD Father    Stroke Father    Colon cancer Sister        26   Breast cancer Maternal Aunt    Seizures Maternal Grandmother    Cancer Cousin    Colon cancer Cousin        40's   Breast cancer Other    Cancer Other        breast   Social History   Occupational History   Occupation: Surveyor, [employer] Processor: OTHER   Occupation: retired  Tobacco Use  Smoking status: Never   Smokeless tobacco: Never  Vaping Use   Vaping status: Never Used  Substance and Sexual Activity   Alcohol use: No   Drug use: No   Sexual activity: Yes    Birth control/protection: Surgical   Tobacco Counseling Counseling given: Not Answered  SDOH Screenings   Food Insecurity: No Food Insecurity (03/30/2024)  Housing: Low Risk (03/30/2024)  Transportation Needs: No Transportation Needs (03/30/2024)  Utilities: Not At Risk (03/30/2024)  Alcohol Screen: Low Risk (03/30/2024)  Depression (PHQ2-9): Low Risk (03/30/2024)  Financial Resource Strain: Low Risk (11/11/2023)  Physical Activity: Insufficiently Active (03/30/2024)  Social Connections: Moderately Integrated (03/30/2024)  Stress: No Stress Concern Present (03/30/2024)  Tobacco Use: Low Risk (03/30/2024)  Health  Literacy: Adequate Health Literacy (03/30/2024)   See flowsheets for full screening details  Depression Screen PHQ 2 & 9 Depression Scale- Over the past 2 weeks, how often have you been bothered by any of the following problems? Little interest or pleasure in doing things: 0 Feeling down, depressed, or hopeless (PHQ Adolescent also includes...irritable): 0 PHQ-2 Total Score: 0 Trouble falling or staying asleep, or sleeping too much: 0 Feeling tired or having little energy: 0 Poor appetite or overeating (PHQ Adolescent also includes...weight loss): 0 Feeling bad about yourself - or that you are a failure or have let yourself or your family down: 0 Trouble concentrating on things, such as reading the newspaper or watching television (PHQ Adolescent also includes...like school work): 0 Moving or speaking so slowly that other people could have noticed. Or the opposite - being so fidgety or restless that you have been moving around a lot more than usual: 0 Thoughts that you would be better off dead, or of hurting yourself in some way: 0 PHQ-9 Total Score: 0 If you checked off any problems, how difficult have these problems made it for you to do your work, take care of things at home, or get along with other people?: Not difficult at all     Goals Addressed             This Visit's Progress    Patient Stated       Wants to continue to lose weight and tone up             Objective:    Today's Vitals   03/30/24 1426 03/30/24 1450  BP: (!) 118/92 120/83  Weight: 178 lb (80.7 kg)   Height: 5' 3 (1.6 m)    Body mass index is 31.53 kg/m.  Hearing/Vision screen Hearing Screening - Comments:: No issues Vision Screening - Comments:: Glasses, [facility], up to date Immunizations and Health Maintenance Health Maintenance  Topic Date Due   FOOT EXAM  09/10/2023   Influenza Vaccine  06/14/2024 (Originally 10/16/2023)   HEMOGLOBIN A1C  05/22/2024   OPHTHALMOLOGY EXAM  11/03/2024    Diabetic kidney evaluation - eGFR measurement  11/22/2024   Diabetic kidney evaluation - Urine ACR  11/22/2024   Mammogram  12/07/2024   Medicare Annual Wellness (AWV)  03/30/2025   Colonoscopy  09/17/2026   DTaP/Tdap/Td (5 - Td or Tdap) 01/09/2032   Pneumococcal Vaccine: 50+ Years  Completed   Bone Density Scan  Completed   Hepatitis C Screening  Completed   Zoster Vaccines- Shingrix  Completed   Meningococcal B Vaccine  Aged Out   COVID-19 Vaccine  Discontinued        Assessment/Plan:  This is a routine wellness examination for Jakya.  Patient Care Team:  Abbey Bruckner, MD as PCP - General (Family Medicine) Cleotilde Barrio, MD (Orthopedic Surgery) Babara Call, MD as Consulting Physician (Hematology and Oncology) Fernand Denyse LABOR, MD as Consulting Physician (Cardiology) Betha Lonni PARAS, MD as Referring Physician (Ophthalmology)  I have personally reviewed and noted the following in the patients chart:   Medical and social history Use of alcohol, tobacco or illicit drugs  Current medications and supplements including opioid prescriptions. Functional ability and status Nutritional status Physical activity Advanced directives List of other physicians Hospitalizations, surgeries, and ER visits in previous 12 months Vitals Screenings to include cognitive, depression, and falls Referrals and appointments  No orders of the defined types were placed in this encounter.  In addition, I have reviewed and discussed with patient certain preventive protocols, quality metrics, and best practice recommendations. A written personalized care plan for preventive services as well as general preventive health recommendations were provided to patient.   Angeline Fredericks, LPN   [id number]   Return in 1 year (on 03/30/2025).  After Visit Summary: (MyChart) Due to this being a telephonic visit, the after visit summary with patients personalized plan was offered to patient via MyChart   Nurse  Notes: Patient declines flu vaccine. Patient needs a foot exam completed and documented at next office visit. "

## 2024-05-19 ENCOUNTER — Other Ambulatory Visit

## 2024-05-23 ENCOUNTER — Ambulatory Visit

## 2024-06-03 ENCOUNTER — Ambulatory Visit: Admitting: Cardiovascular Disease

## 2025-04-04 ENCOUNTER — Ambulatory Visit
